# Patient Record
Sex: Male | Born: 1940 | Race: White | Hispanic: Yes | Marital: Married | Smoking: Former smoker
Health system: Southern US, Community
[De-identification: ages and names within clinical notes are randomized; demographics above are authoritative.]

## PROBLEM LIST (undated history)

## (undated) DIAGNOSIS — I420 Dilated cardiomyopathy: Secondary | ICD-10-CM

## (undated) DIAGNOSIS — I454 Nonspecific intraventricular block: Secondary | ICD-10-CM

## (undated) DIAGNOSIS — E119 Type 2 diabetes mellitus without complications: Secondary | ICD-10-CM

## (undated) DIAGNOSIS — N179 Acute kidney failure, unspecified: Secondary | ICD-10-CM

## (undated) DIAGNOSIS — N189 Chronic kidney disease, unspecified: Secondary | ICD-10-CM

## (undated) DIAGNOSIS — I509 Heart failure, unspecified: Secondary | ICD-10-CM

## (undated) DIAGNOSIS — I10 Essential (primary) hypertension: Secondary | ICD-10-CM

## (undated) DIAGNOSIS — I480 Paroxysmal atrial fibrillation: Secondary | ICD-10-CM

## (undated) HISTORY — PX: KIDNEY STONE SURGERY: SHX686

---

## 2014-04-20 ENCOUNTER — Emergency Department (HOSPITAL_COMMUNITY): Payer: Medicare Other

## 2014-04-20 ENCOUNTER — Encounter (HOSPITAL_COMMUNITY): Payer: Self-pay | Admitting: Emergency Medicine

## 2014-04-20 ENCOUNTER — Inpatient Hospital Stay (HOSPITAL_COMMUNITY)
Admission: EM | Admit: 2014-04-20 | Discharge: 2014-04-24 | DRG: 291 | Disposition: A | Discharge status: Home / Self Care | Payer: Medicare Other | Attending: Internal Medicine | Admitting: Internal Medicine

## 2014-04-20 DIAGNOSIS — I5043 Acute on chronic combined systolic (congestive) and diastolic (congestive) heart failure: Secondary | ICD-10-CM

## 2014-04-20 DIAGNOSIS — Z6829 Body mass index (BMI) 29.0-29.9, adult: Secondary | ICD-10-CM

## 2014-04-20 DIAGNOSIS — Z87891 Personal history of nicotine dependence: Secondary | ICD-10-CM

## 2014-04-20 DIAGNOSIS — I5031 Acute diastolic (congestive) heart failure: Secondary | ICD-10-CM

## 2014-04-20 DIAGNOSIS — E1149 Type 2 diabetes mellitus with other diabetic neurological complication: Secondary | ICD-10-CM | POA: Diagnosis present

## 2014-04-20 DIAGNOSIS — N182 Chronic kidney disease, stage 2 (mild): Secondary | ICD-10-CM | POA: Diagnosis present

## 2014-04-20 DIAGNOSIS — Z833 Family history of diabetes mellitus: Secondary | ICD-10-CM

## 2014-04-20 DIAGNOSIS — I447 Left bundle-branch block, unspecified: Secondary | ICD-10-CM | POA: Diagnosis present

## 2014-04-20 DIAGNOSIS — I5023 Acute on chronic systolic (congestive) heart failure: Secondary | ICD-10-CM

## 2014-04-20 DIAGNOSIS — E119 Type 2 diabetes mellitus without complications: Secondary | ICD-10-CM

## 2014-04-20 DIAGNOSIS — Z7902 Long term (current) use of antithrombotics/antiplatelets: Secondary | ICD-10-CM

## 2014-04-20 DIAGNOSIS — I10 Essential (primary) hypertension: Secondary | ICD-10-CM | POA: Diagnosis present

## 2014-04-20 DIAGNOSIS — E663 Overweight: Secondary | ICD-10-CM | POA: Diagnosis present

## 2014-04-20 DIAGNOSIS — I498 Other specified cardiac arrhythmias: Secondary | ICD-10-CM | POA: Diagnosis present

## 2014-04-20 DIAGNOSIS — R0602 Shortness of breath: Secondary | ICD-10-CM | POA: Diagnosis not present

## 2014-04-20 DIAGNOSIS — J9600 Acute respiratory failure, unspecified whether with hypoxia or hypercapnia: Secondary | ICD-10-CM | POA: Diagnosis present

## 2014-04-20 DIAGNOSIS — I420 Dilated cardiomyopathy: Secondary | ICD-10-CM | POA: Diagnosis present

## 2014-04-20 DIAGNOSIS — E1142 Type 2 diabetes mellitus with diabetic polyneuropathy: Secondary | ICD-10-CM | POA: Diagnosis present

## 2014-04-20 DIAGNOSIS — Z789 Other specified health status: Secondary | ICD-10-CM | POA: Diagnosis present

## 2014-04-20 DIAGNOSIS — IMO0001 Reserved for inherently not codable concepts without codable children: Secondary | ICD-10-CM

## 2014-04-20 DIAGNOSIS — I509 Heart failure, unspecified: Secondary | ICD-10-CM | POA: Diagnosis present

## 2014-04-20 DIAGNOSIS — I5021 Acute systolic (congestive) heart failure: Secondary | ICD-10-CM

## 2014-04-20 DIAGNOSIS — I5042 Chronic combined systolic (congestive) and diastolic (congestive) heart failure: Secondary | ICD-10-CM

## 2014-04-20 DIAGNOSIS — Z794 Long term (current) use of insulin: Secondary | ICD-10-CM

## 2014-04-20 DIAGNOSIS — I129 Hypertensive chronic kidney disease with stage 1 through stage 4 chronic kidney disease, or unspecified chronic kidney disease: Secondary | ICD-10-CM | POA: Diagnosis present

## 2014-04-20 HISTORY — DX: Essential (primary) hypertension: I10

## 2014-04-20 HISTORY — DX: Heart failure, unspecified: I50.9

## 2014-04-20 HISTORY — DX: Type 2 diabetes mellitus without complications: E11.9

## 2014-04-20 NOTE — ED Notes (Signed)
Sob; EMS states, "abd. Distention; blowing into balloon helps; does have a weak heart [no cardiologists.]" liver distention and dependent edema. RA sao2 88%; 4l . > 93 %

## 2014-04-20 NOTE — ED Provider Notes (Signed)
CSN: [id number]     Arrival date & time 04/20/14  2313 History   First MD Initiated Contact with Patient 04/20/14 2320     Chief Complaint  Patient presents with  . Shortness of Breath     (Consider location/radiation/quality/duration/timing/severity/associated sxs/prior Treatment) Patient is a 73 y.o. male presenting with shortness of breath. The history is limited by the condition of the patient and a language barrier.  Shortness of Breath Severity:  Severe Onset quality:  Gradual Duration:  1 week Timing:  Constant Progression:  Worsening Chronicity:  New Context: not weather changes   Relieved by:  Nothing Worsened by:  Nothing tried Ineffective treatments:  None tried Associated symptoms: no chest pain, no fever and no wheezing   Risk factors: no hx of PE/DVT     History reviewed. No pertinent past medical history. History reviewed. No pertinent past surgical history. History reviewed. No pertinent family history. History  Substance Use Topics  . Smoking status: Not on file  . Smokeless tobacco: Not on file  . Alcohol Use: Not on file    Review of Systems  Constitutional: Negative for fever.  Respiratory: Positive for shortness of breath. Negative for wheezing.   Cardiovascular: Negative for chest pain.  All other systems reviewed and are negative.     Allergies  Review of patient's allergies indicates no known allergies.  Home Medications   Prior to Admission medications   Not on File   There were no vitals taken for this visit. Physical Exam  Constitutional: He is oriented to person, place, and time. He appears well-developed and well-nourished.  HENT:  Head: Normocephalic.  Mouth/Throat: Oropharynx is clear and moist. No oropharyngeal exudate.  Eyes: Conjunctivae are normal. Pupils are equal, round, and reactive to light.  Neck: Normal range of motion. Neck supple.  Cardiovascular: Regular rhythm.  Tachycardia present.   Pulmonary/Chest: No  stridor. No respiratory distress. He has no wheezes. He has rales.  Abdominal: Soft. Bowel sounds are normal. There is no tenderness. There is no rebound and no guarding.  Musculoskeletal: Normal range of motion. He exhibits edema.  Neurological: He is alert and oriented to person, place, and time.  Skin: Skin is warm and dry. He is not diaphoretic.  Psychiatric: He has a normal mood and affect.    ED Course  Procedures (including critical care time) Labs Review Labs Reviewed  CBC  BASIC METABOLIC PANEL  PRO B NATRIURETIC PEPTIDE  HEPATIC FUNCTION PANEL  LIPASE, BLOOD  I-STAT TROPOININ, ED    Imaging Review No results found.   EKG Interpretation None      MDM   Final diagnoses:  None     Date: 04/20/2014  Rate: 105  Rhythm: sinus tachycardia  QRS Axis: left  Intervals: QT prolonged  ST/T Wave abnormalities: normal  Conduction Disutrbances:left bundle branch block  Narrative Interpretation:   Old EKG Reviewed: none available    Anasarca with SOB for CHF needs admission  Nicol Herbig K Zoelle Markus-Rasch, MD 04/21/14 [phone]

## 2014-04-21 ENCOUNTER — Encounter (HOSPITAL_COMMUNITY): Payer: Self-pay | Admitting: Emergency Medicine

## 2014-04-21 DIAGNOSIS — I5021 Acute systolic (congestive) heart failure: Secondary | ICD-10-CM

## 2014-04-21 DIAGNOSIS — I509 Heart failure, unspecified: Secondary | ICD-10-CM | POA: Diagnosis present

## 2014-04-21 DIAGNOSIS — E1149 Type 2 diabetes mellitus with other diabetic neurological complication: Secondary | ICD-10-CM

## 2014-04-21 DIAGNOSIS — I5043 Acute on chronic combined systolic (congestive) and diastolic (congestive) heart failure: Secondary | ICD-10-CM | POA: Diagnosis present

## 2014-04-21 DIAGNOSIS — Z833 Family history of diabetes mellitus: Secondary | ICD-10-CM | POA: Diagnosis not present

## 2014-04-21 DIAGNOSIS — Z794 Long term (current) use of insulin: Secondary | ICD-10-CM

## 2014-04-21 DIAGNOSIS — Z87891 Personal history of nicotine dependence: Secondary | ICD-10-CM | POA: Diagnosis not present

## 2014-04-21 DIAGNOSIS — E119 Type 2 diabetes mellitus without complications: Secondary | ICD-10-CM

## 2014-04-21 DIAGNOSIS — I5020 Unspecified systolic (congestive) heart failure: Secondary | ICD-10-CM

## 2014-04-21 DIAGNOSIS — I447 Left bundle-branch block, unspecified: Secondary | ICD-10-CM | POA: Diagnosis present

## 2014-04-21 DIAGNOSIS — I5023 Acute on chronic systolic (congestive) heart failure: Secondary | ICD-10-CM | POA: Insufficient documentation

## 2014-04-21 DIAGNOSIS — E1142 Type 2 diabetes mellitus with diabetic polyneuropathy: Secondary | ICD-10-CM | POA: Diagnosis present

## 2014-04-21 DIAGNOSIS — Z7902 Long term (current) use of antithrombotics/antiplatelets: Secondary | ICD-10-CM | POA: Diagnosis not present

## 2014-04-21 DIAGNOSIS — N182 Chronic kidney disease, stage 2 (mild): Secondary | ICD-10-CM | POA: Diagnosis present

## 2014-04-21 DIAGNOSIS — J9600 Acute respiratory failure, unspecified whether with hypoxia or hypercapnia: Secondary | ICD-10-CM | POA: Diagnosis present

## 2014-04-21 DIAGNOSIS — IMO0001 Reserved for inherently not codable concepts without codable children: Secondary | ICD-10-CM

## 2014-04-21 DIAGNOSIS — I129 Hypertensive chronic kidney disease with stage 1 through stage 4 chronic kidney disease, or unspecified chronic kidney disease: Secondary | ICD-10-CM | POA: Diagnosis present

## 2014-04-21 DIAGNOSIS — I5042 Chronic combined systolic (congestive) and diastolic (congestive) heart failure: Secondary | ICD-10-CM | POA: Insufficient documentation

## 2014-04-21 DIAGNOSIS — R0602 Shortness of breath: Secondary | ICD-10-CM | POA: Diagnosis present

## 2014-04-21 DIAGNOSIS — Z789 Other specified health status: Secondary | ICD-10-CM | POA: Diagnosis present

## 2014-04-21 DIAGNOSIS — Z6829 Body mass index (BMI) 29.0-29.9, adult: Secondary | ICD-10-CM | POA: Diagnosis not present

## 2014-04-21 DIAGNOSIS — E663 Overweight: Secondary | ICD-10-CM | POA: Diagnosis present

## 2014-04-21 DIAGNOSIS — I059 Rheumatic mitral valve disease, unspecified: Secondary | ICD-10-CM

## 2014-04-21 DIAGNOSIS — I498 Other specified cardiac arrhythmias: Secondary | ICD-10-CM | POA: Diagnosis present

## 2014-04-21 DIAGNOSIS — I5031 Acute diastolic (congestive) heart failure: Secondary | ICD-10-CM

## 2014-04-21 DIAGNOSIS — I10 Essential (primary) hypertension: Secondary | ICD-10-CM

## 2014-04-21 LAB — BASIC METABOLIC PANEL
ANION GAP: 12 (ref 5–15)
BUN: 15 mg/dL (ref 6–23)
CO2: 28 mEq/L (ref 19–32)
CREATININE: 1.2 mg/dL (ref 0.50–1.35)
Calcium: 8.8 mg/dL (ref 8.4–10.5)
Chloride: 102 mEq/L (ref 96–112)
GFR calc non Af Amer: 58 mL/min — ABNORMAL LOW (ref >90)
GFR, EST AFRICAN AMERICAN: 67 mL/min — AB (ref >90)
Glucose, Bld: 225 mg/dL — ABNORMAL HIGH (ref 70–99)
POTASSIUM: 4.8 meq/L (ref 3.7–5.3)
Sodium: 142 mEq/L (ref 137–147)

## 2014-04-21 LAB — CBC
HEMATOCRIT: 39.4 % (ref 39.0–52.0)
Hemoglobin: 13.4 g/dL (ref 13.0–17.0)
MCH: 31.2 pg (ref 26.0–34.0)
MCHC: 34 g/dL (ref 30.0–36.0)
MCV: 91.6 fL (ref 78.0–100.0)
PLATELETS: 155 10*3/uL (ref 150–400)
RBC: 4.3 MIL/uL (ref 4.22–5.81)
RDW: 12.6 % (ref 11.5–15.5)
WBC: 6.3 10*3/uL (ref 4.0–10.5)

## 2014-04-21 LAB — HEPATIC FUNCTION PANEL
ALT: 34 U/L (ref 0–53)
AST: 37 U/L (ref 0–37)
Albumin: 3.8 g/dL (ref 3.5–5.2)
Alkaline Phosphatase: 67 U/L (ref 39–117)
BILIRUBIN TOTAL: 0.6 mg/dL (ref 0.3–1.2)
Total Protein: 7 g/dL (ref 6.0–8.3)

## 2014-04-21 LAB — I-STAT TROPONIN, ED: Troponin i, poc: 0.01 ng/mL (ref 0.00–0.08)

## 2014-04-21 LAB — GLUCOSE, CAPILLARY
GLUCOSE-CAPILLARY: 202 mg/dL — AB (ref 70–99)
Glucose-Capillary: 196 mg/dL — ABNORMAL HIGH (ref 70–99)
Glucose-Capillary: 90 mg/dL (ref 70–99)
Glucose-Capillary: 177 mg/dL — ABNORMAL HIGH (ref 70–99)

## 2014-04-21 LAB — LIPASE, BLOOD: Lipase: 29 U/L (ref 11–59)

## 2014-04-21 LAB — PRO B NATRIURETIC PEPTIDE: Pro B Natriuretic peptide (BNP): 1756 pg/mL — ABNORMAL HIGH (ref 0–125)

## 2014-04-21 LAB — HEMOGLOBIN A1C
HEMOGLOBIN A1C: 7.1 % — AB (ref <5.7)
MEAN PLASMA GLUCOSE: 157 mg/dL — AB (ref <117)

## 2014-04-21 MED ORDER — ONDANSETRON HCL 4 MG/2ML IJ SOLN: 4.0000 mg | Freq: Four times a day (QID) | INTRAMUSCULAR | Status: DC | PRN

## 2014-04-21 MED ORDER — DIGOXIN 125 MCG PO TABS
0.1250 mg | ORAL_TABLET | Freq: Every day | ORAL | Status: DC
  Administered 2014-04-21 – 2014-04-24 (×4): 0.125 mg via ORAL
  Filled 2014-04-21 (×4): qty 1

## 2014-04-21 MED ORDER — INSULIN ASPART 100 UNIT/ML ~~LOC~~ SOLN
28.0000 [IU] | Freq: Every day | SUBCUTANEOUS | Status: DC
  Administered 2014-04-21: 28 [IU] via SUBCUTANEOUS

## 2014-04-21 MED ORDER — INSULIN GLARGINE 100 UNIT/ML ~~LOC~~ SOLN
80.0000 [IU] | Freq: Every day | SUBCUTANEOUS | Status: DC
  Administered 2014-04-21: 80 [IU] via SUBCUTANEOUS
  Filled 2014-04-21: qty 0.8

## 2014-04-21 MED ORDER — MORPHINE SULFATE 2 MG/ML IJ SOLN: 1.0000 mg | INTRAMUSCULAR | Status: DC | PRN

## 2014-04-21 MED ORDER — INSULIN ASPART 100 UNIT/ML ~~LOC~~ SOLN
20.0000 [IU] | Freq: Every day | SUBCUTANEOUS | Status: DC
  Administered 2014-04-21: 20 [IU] via SUBCUTANEOUS

## 2014-04-21 MED ORDER — SODIUM CHLORIDE 0.9 % IJ SOLN: 3.0000 mL | INTRAMUSCULAR | Status: DC | PRN

## 2014-04-21 MED ORDER — ACETAMINOPHEN 325 MG PO TABS: 650.0000 mg | ORAL_TABLET | ORAL | Status: DC | PRN

## 2014-04-21 MED ORDER — POTASSIUM CHLORIDE CRYS ER 10 MEQ PO TBCR
10.0000 meq | EXTENDED_RELEASE_TABLET | Freq: Once | ORAL | Status: AC
  Administered 2014-04-21: 10 meq via ORAL
  Filled 2014-04-21: qty 1

## 2014-04-21 MED ORDER — ATORVASTATIN CALCIUM 40 MG PO TABS
40.0000 mg | ORAL_TABLET | Freq: Every day | ORAL | Status: DC
  Administered 2014-04-21 – 2014-04-24 (×4): 40 mg via ORAL
  Filled 2014-04-21 (×4): qty 1

## 2014-04-21 MED ORDER — SODIUM CHLORIDE 0.9 % IJ SOLN
3.0000 mL | Freq: Two times a day (BID) | INTRAMUSCULAR | Status: DC
  Administered 2014-04-21 (×2): 3 mL via INTRAVENOUS
  Administered 2014-04-21: 11:00:00 via INTRAVENOUS
  Administered 2014-04-22 – 2014-04-24 (×5): 3 mL via INTRAVENOUS

## 2014-04-21 MED ORDER — INSULIN GLARGINE 100 UNIT/ML ~~LOC~~ SOLN
65.0000 [IU] | Freq: Every day | SUBCUTANEOUS | Status: DC
  Administered 2014-04-22: 65 [IU] via SUBCUTANEOUS
  Filled 2014-04-21: qty 0.65

## 2014-04-21 MED ORDER — INSULIN ASPART 100 UNIT/ML ~~LOC~~ SOLN
0.0000 [IU] | Freq: Every day | SUBCUTANEOUS | Status: DC
  Administered 2014-04-23: 2 [IU] via SUBCUTANEOUS

## 2014-04-21 MED ORDER — GABAPENTIN 300 MG PO CAPS
300.0000 mg | ORAL_CAPSULE | Freq: Three times a day (TID) | ORAL | Status: DC
  Administered 2014-04-21 – 2014-04-24 (×11): 300 mg via ORAL
  Filled 2014-04-21 (×12): qty 1

## 2014-04-21 MED ORDER — FUROSEMIDE 10 MG/ML IJ SOLN
100.0000 mg | Freq: Two times a day (BID) | INTRAVENOUS | Status: AC
  Administered 2014-04-21 – 2014-04-22 (×4): 100 mg via INTRAVENOUS
  Filled 2014-04-21 (×4): qty 10

## 2014-04-21 MED ORDER — ZOLPIDEM TARTRATE 5 MG PO TABS: 5.0000 mg | ORAL_TABLET | Freq: Every evening | ORAL | Status: DC | PRN

## 2014-04-21 MED ORDER — INSULIN ASPART 100 UNIT/ML ~~LOC~~ SOLN
0.0000 [IU] | SUBCUTANEOUS | Status: DC
  Administered 2014-04-21: 2 [IU] via SUBCUTANEOUS
  Administered 2014-04-21: 3 [IU] via SUBCUTANEOUS

## 2014-04-21 MED ORDER — ENALAPRIL MALEATE 2.5 MG PO TABS
2.5000 mg | ORAL_TABLET | Freq: Every day | ORAL | Status: DC
  Administered 2014-04-21 – 2014-04-24 (×4): 2.5 mg via ORAL
  Filled 2014-04-21 (×4): qty 1

## 2014-04-21 MED ORDER — ENOXAPARIN SODIUM 40 MG/0.4ML ~~LOC~~ SOLN
40.0000 mg | SUBCUTANEOUS | Status: DC
Start: 2014-04-21 — End: 2014-04-24
  Administered 2014-04-21 – 2014-04-24 (×4): 40 mg via SUBCUTANEOUS
  Filled 2014-04-21 (×4): qty 0.4

## 2014-04-21 MED ORDER — INSULIN ASPART 100 UNIT/ML ~~LOC~~ SOLN: 32.0000 [IU] | Freq: Every day | SUBCUTANEOUS | Status: DC

## 2014-04-21 MED ORDER — CARVEDILOL 3.125 MG PO TABS
3.1250 mg | ORAL_TABLET | Freq: Two times a day (BID) | ORAL | Status: DC
  Administered 2014-04-21 – 2014-04-24 (×8): 3.125 mg via ORAL
  Filled 2014-04-21 (×10): qty 1

## 2014-04-21 MED ORDER — INSULIN ASPART 100 UNIT/ML ~~LOC~~ SOLN
10.0000 [IU] | Freq: Three times a day (TID) | SUBCUTANEOUS | Status: DC
  Administered 2014-04-21 – 2014-04-24 (×8): 10 [IU] via SUBCUTANEOUS

## 2014-04-21 MED ORDER — SODIUM CHLORIDE 0.9 % IV SOLN: 250.0000 mL | INTRAVENOUS | Status: DC | PRN

## 2014-04-21 MED ORDER — CLOPIDOGREL BISULFATE 75 MG PO TABS
75.0000 mg | ORAL_TABLET | Freq: Every day | ORAL | Status: DC
  Administered 2014-04-21 – 2014-04-24 (×4): 75 mg via ORAL
  Filled 2014-04-21 (×4): qty 1

## 2014-04-21 MED ORDER — FUROSEMIDE 10 MG/ML IJ SOLN
40.0000 mg | Freq: Once | INTRAMUSCULAR | Status: AC
  Administered 2014-04-21: 40 mg via INTRAVENOUS
  Filled 2014-04-21: qty 4

## 2014-04-21 MED ORDER — INSULIN ASPART 100 UNIT/ML ~~LOC~~ SOLN: 20.0000 [IU] | Freq: Three times a day (TID) | SUBCUTANEOUS | Status: DC

## 2014-04-21 MED ORDER — INSULIN ASPART 100 UNIT/ML ~~LOC~~ SOLN
0.0000 [IU] | Freq: Three times a day (TID) | SUBCUTANEOUS | Status: DC
  Administered 2014-04-22: 3 [IU] via SUBCUTANEOUS
  Administered 2014-04-22: 2 [IU] via SUBCUTANEOUS
  Administered 2014-04-23 – 2014-04-24 (×3): 1 [IU] via SUBCUTANEOUS

## 2014-04-21 MED ORDER — POLYETHYLENE GLYCOL 3350 17 G PO PACK
17.0000 g | PACK | Freq: Two times a day (BID) | ORAL | Status: DC
  Administered 2014-04-21 – 2014-04-24 (×7): 17 g via ORAL
  Filled 2014-04-21 (×8): qty 1

## 2014-04-21 NOTE — H&P (Signed)
[facility] History and Physical  Stanley Farmer HRC:[id number] DOB: 1941-04-16 DOA: 04/20/2014  Referring physician:  EDP PCP:  Dr. Modesto Charon in [facility]:   Chief Complaint:  SOB  HPI: Stanley Farmer is a 73 y.o. male with a history of CHF, HTN, and IDDM who presents to the ED with complaints of worsening SOB and swelling of his lower legs x 1 week.   He denies having any Chest pain.  When he arrived to the ED he was found to be hypoxic, and was placed on 4 liters NCO2, his evaluation revealed a Pro BNP = 1756.0 and a Chest X-Ray with Cardiomegaly and Early Interstitial Edema.   He was administered IV Lasix and referred for medical admission.   His Son is at the bedside and assisted in translation of his medical history.      Review of Systems:  Constitutional: No Weight Loss, No Weight Gain, Night Sweats, Fevers, Chills, Fatigue, or Generalized Weakness HEENT: No Headaches, Difficulty Swallowing,Tooth/Dental Problems,Sore Throat,  No Sneezing, Rhinitis, Ear Ache, Nasal Congestion, or Post Nasal Drip,  Cardio-vascular:  No Chest pain, +Orthopnea, PND, +Edema in lower extremities, +Anasarca, Dizziness, Palpitations  Resp:  +Dyspnea,  +DOE, No Cough, No Hemoptysis, No Wheezing.    GI: No Heartburn, Indigestion, Abdominal Pain, Nausea, Vomiting, Diarrhea, Change in Bowel Habits,  Loss of Appetite  GU: No Dysuria, Change in Color of Urine, No Urgency or Frequency.  No flank pain.  Musculoskeletal: No Joint Pain or Swelling.  No Decreased Range of Motion. No Back Pain.  Neurologic: No Syncope, No Seizures, Muscle Weakness, Paresthesia, Vision Disturbance or Loss, No Diplopia, No Vertigo, No Difficulty Walking,  Skin: No Rash or Lesions. Psych: No Change in Mood or Affect. No Depression or Anxiety. No Memory loss. No Confusion or Hallucinations   Past Medical History  Diagnosis Date  . Hypertension   . Diabetes mellitus without complication   . CHF (congestive heart  failure)       Past Surgical History  Procedure Laterality Date  . Kidney stone surgery         Prior to Admission medications   Medication Sig Start Date End Date Taking? Authorizing Provider  atorvastatin (LIPITOR) 40 MG tablet Take 40 mg by mouth daily.   Yes Historical Provider, MD  carvedilol (COREG) 3.125 MG tablet Take 3.125 mg by mouth 2 (two) times daily with a meal.   Yes Historical Provider, MD  clopidogrel (PLAVIX) 75 MG tablet Take 75 mg by mouth daily.   Yes Historical Provider, MD  digoxin (LANOXIN) 0.125 MG tablet Take 0.125 mg by mouth daily.   Yes Historical Provider, MD  enalapril (VASOTEC) 2.5 MG tablet Take 2.5 mg by mouth daily.   Yes Historical Provider, MD  furosemide (LASIX) 40 MG tablet Take 40-80 mg by mouth 2 (two) times daily. Take 2 tablets every morning and 1 tablet at lunch   Yes Historical Provider, MD  gabapentin (NEURONTIN) 300 MG capsule Take 300 mg by mouth 3 (three) times daily.   Yes Historical Provider, MD  insulin aspart (NOVOLOG) 100 UNIT/ML injection Inject 20-32 Units into the skin 3 (three) times daily with meals. Use 20 units every morning, use 28 units at lunch and 32 units every evening   Yes Historical Provider, MD  insulin glargine (LANTUS) 100 UNIT/ML injection Inject 80 Units into the skin daily.   Yes Historical Provider, MD  polyethylene glycol (MIRALAX / GLYCOLAX) packet Take 17 g by mouth 2 (two)  times daily.   Yes Historical Provider, MD      No Known Allergies   Social History:  reports that he has quit smoking. He does not have any smokeless tobacco history on file. He reports that he does not drink alcohol or use illicit drugs.     Family History  Problem Relation Age of Onset  . Diabetes Mother        Physical Exam:  GEN:  Pleasant Obese 73 y.o. Hispanic male examined and in no acute distress; cooperative with exam Filed Vitals:   04/21/14 0130 04/21/14 0145 04/21/14 0200 04/21/14 0222  BP: 130/72 154/61 146/71  131/69  Pulse: 94 110 97 91  Temp:      TempSrc:      Resp: 22 26 18    Height:      Weight:      SpO2: 97% 98% 96% 98%   Blood pressure 131/69, pulse 91, temperature 98.4 F (36.9 C), temperature source Oral, resp. rate 18, height 5\' 6"  (1.676 m), weight 84.369 kg (186 lb), SpO2 98.00%. PSYCH: He is alert and oriented x4; does not appear anxious does not appear depressed; affect is normal HEENT: Normocephalic and Atraumatic, Mucous membranes pink; PERRLA; EOM intact; Fundi:  Benign;  No scleral icterus, Nares: Patent, Oropharynx: Clear, Fair Dentition, Neck:  FROM, no cervical lymphadenopathy nor thyromegaly or carotid bruit; no JVD; Breasts:: Not examined CHEST WALL: No tenderness CHEST: Unlabored Breathing but  Decreased Breath Sounds, +Bibasilar Rales , No Rhonchi, No wheezes.   HEART: Regular rate and rhythm; no murmurs rubs or gallops BACK: No kyphosis or scoliosis; no CVA tenderness ABDOMEN: Positive Bowel Sounds, Obese, soft non-tender; no masses, no organomegaly. Rectal Exam: Not done EXTREMITIES: No cyanosis, clubbing,  +EDEMA 2+ to BLEs;  no ulcerations. Genitalia: not examined PULSES: 2+ and symmetric SKIN: Normal hydration no rash or ulceration CNS: Alert and Oriented x 4, No Focal Deficits.    Vascular: pulses palpable throughout    Labs on Admission:  Basic Metabolic Panel:  Recent Labs Lab 04/20/14 2350  NA 142  K 4.8  CL 102  CO2 28  GLUCOSE 225*  BUN 15  CREATININE 1.20  CALCIUM 8.8   Liver Function Tests:  Recent Labs Lab 04/20/14 2350  AST 37  ALT 34  ALKPHOS 67  BILITOT 0.6  PROT 7.0  ALBUMIN 3.8    Recent Labs Lab 04/20/14 2350  LIPASE 29   No results found for this basename: AMMONIA,  in the last 168 hours CBC:  Recent Labs Lab 04/20/14 2350  WBC 6.3  HGB 13.4  HCT 39.4  MCV 91.6  PLT 155   Cardiac Enzymes: No results found for this basename: CKTOTAL, CKMB, CKMBINDEX, TROPONINI,  in the last 168 hours  BNP (last 3  results)  Recent Labs  04/20/14 2350  PROBNP 1756.0*   CBG: No results found for this basename: GLUCAP,  in the last 168 hours  Radiological Exams on Admission: Dg Chest Port 1 View  04/21/2014   CLINICAL DATA:  Shortness of breath, chest pain.  EXAM: PORTABLE CHEST - 1 VIEW  COMPARISON:  None.  FINDINGS: Cardiomegaly. No confluent airspace opacities or effusions. Mild interstitial prominence and Kerley B-lines in the lung bases concerning for early interstitial edema. No acute bony abnormality.  IMPRESSION: Cardiomegaly, suspect early interstitial edema.   Electronically Signed   By: Charlett NoseKevin  Dover M.D.   On: 04/21/2014 00:09      EKG: Independently reviewed. Sinus Tachycardia at 105, +  LBBB    Assessment/Plan:   73 y.o. male with  Principal Problem:   Acute diastolic CHF (congestive heart failure) Active Problems:   CHF (congestive heart failure)   Acute respiratory failure   Essential hypertension   IDDM (insulin dependent diabetes mellitus)     1.   Acute Diastolic CHF-  Placed on the CHF Protocol to diurese with IV Lasix 100 mg every 12 hours.   The initial K+ = 4.8,  Monitor K+ levels.  2 D ECHO in AM.    3.   Acute Respiratory Failure-  Due to #1,  [place] O2 PRN.     3.  HTN-  Continue Lasix, Coreg, and Vasotec RX. Monitor BPs.     4.  IDDM-  Continue Lantus and meal TID Novolog, SSI coverage PRN with Sensitive.  Check Glucose every 4 hours due to his home Regimen   5.  DVT prophylaxis with Lovenox.          Code Status:   FULL CODE  Family Communication:    Family at Bedside Disposition Plan:   Inpatient Telemetry Bed    Time spent:   60 Minutes  Ron Parker [facility] Pager [phone]  If 7PM-7AM, please contact night-coverage [email or web address] Password [facility] 04/21/2014, 2:37 AM

## 2014-04-21 NOTE — Progress Notes (Signed)
Echocardiogram 2D Echocardiogram has been performed.  Stanley Farmer 04/21/2014, 11:11 AM

## 2014-04-21 NOTE — ED Notes (Signed)
Transporting patient to new room assignment. 

## 2014-04-21 NOTE — Progress Notes (Signed)
[facility] PROGRESS NOTE  Stanley Farmer [id number]:[id number] DOB: 1941-02-07 DOA: 04/20/2014 PCP: No primary provider on file.  Assessment/Plan: Principal Problem:   Acute on chronic combined systolic and diastolic CHF (congestive heart failure): Diuresed 1.4 L already. Echocardiogram notes markedly depressed ejection fraction plus diastolic dysfunction. Patient used to see a cardiologist in [place], but has not seen him in a very long time. Based off of his medications including beta blocker, ACE inhibitor, digoxin and diuretic, this is clearly not a new diagnosis. Have involved [facility] cardiology for help in management. Likely he will need strong and close follow up as outpatient. Tried to convey to family the seriousness of his heart condition Active Problems:   Acute respiratory failure: Resolved. After one dose of Lasix, diuresed 1.5 L and is already breathing much more comfortably.   Essential hypertension: Continue his home medications.    Non-English speaking patient: Medical interpreter used    DM type 2 with diabetic peripheral neuropathy: Despite aggressively high insulin coverage with Lantus and 3 times a day NovoLog, patient's A1c actually shows good control. Continue Neurontin. I have scaled down his NovoLog and his Lantus as I suspect his insulin needs may go down as he is diuresed.    CKD (chronic kidney disease), stage II: Looks to be stable, with creatinine around 1.2.  Overweight: Patient needs criteria with a BMI 29   Code Status: Full code Family Communication: Multiple family members at bedside.  Disposition Plan: Despite overall poor function of heart, only mildly volume overloaded.  Disposition will be determined on the extent of cardiac workup.   Consultants:  Cardiology  Procedures:  Echocardiogram done 7/5: Grade 2 diastolic dysfunction with a significantly decreased ejection fraction of 15%  Antibiotics:  None  HPI/Subjective: Patient states he's  feeling better. She like his breathing more comfortable. Denies any chest pain or other symptoms.  Objective: Filed Vitals:   04/21/14 0930  BP: 136/72  Pulse: 80  Temp: 98.7 F (37.1 C)  Resp: 20    Intake/Output Summary (Last 24 hours) at 04/21/14 1436 Last data filed at 04/21/14 0900  Gross per 24 hour  Intake    240 ml  Output   1700 ml  Net  -1460 ml   Filed Weights   04/20/14 2328 04/21/14 0259  Weight: 84.369 kg (186 lb) 83.825 kg (184 lb 12.8 oz)    Exam:   General:  Alert and oriented x3, currently no acute distress  Cardiovascular: Soft, regular rate and rhythm, S1-S2, soft 2/6 systolic ejection murmur  Respiratory: Moderate inspiratory effort, decreased breath sounds at the bases, otherwise clear  Abdomen: Soft, obese, nontender, positive bowel sounds  Musculoskeletal: No clubbing or cyanosis, 1+ pitting edema from the knees down   Data Reviewed: Basic Metabolic Panel:  Recent Labs Lab 04/20/14 2350  NA 142  K 4.8  CL 102  CO2 28  GLUCOSE 225*  BUN 15  CREATININE 1.20  CALCIUM 8.8   Liver Function Tests:  Recent Labs Lab 04/20/14 2350  AST 37  ALT 34  ALKPHOS 67  BILITOT 0.6  PROT 7.0  ALBUMIN 3.8    Recent Labs Lab 04/20/14 2350  LIPASE 29   No results found for this basename: AMMONIA,  in the last 168 hours CBC:  Recent Labs Lab 04/20/14 2350  WBC 6.3  HGB 13.4  HCT 39.4  MCV 91.6  PLT 155   Cardiac Enzymes: No results found for this basename: CKTOTAL, CKMB, CKMBINDEX, TROPONINI,  in the  last 168 hours BNP (last 3 results)  Recent Labs  04/20/14 2350  PROBNP 1756.0*   CBG:  Recent Labs Lab 04/21/14 0238 04/21/14 1202  GLUCAP 196* 202*    No results found for this or any previous visit (from the past 240 hour(s)).   Studies: Dg Chest Port 1 View  04/21/2014   CLINICAL DATA:  Shortness of breath, chest pain.  EXAM: PORTABLE CHEST - 1 VIEW  COMPARISON:  None.  FINDINGS: Cardiomegaly. No confluent airspace  opacities or effusions. Mild interstitial prominence and Kerley B-lines in the lung bases concerning for early interstitial edema. No acute bony abnormality.  IMPRESSION: Cardiomegaly, suspect early interstitial edema.   Electronically Signed   By: Charlett NoseKevin  Farmer M.D.   On: 04/21/2014 00:09    Scheduled Meds: . atorvastatin  40 mg Oral Daily  . carvedilol  3.125 mg Oral BID WC  . clopidogrel  75 mg Oral Daily  . digoxin  0.125 mg Oral Daily  . enalapril  2.5 mg Oral Daily  . enoxaparin (LOVENOX) injection  40 mg Subcutaneous Q24H  . furosemide  100 mg Intravenous Q12H  . gabapentin  300 mg Oral TID  . insulin aspart  0-5 Units Subcutaneous QHS  . insulin aspart  0-9 Units Subcutaneous TID WC  . insulin aspart  10 Units Subcutaneous TID WC  . [START ON 04/22/2014] insulin glargine  65 Units Subcutaneous Daily  . polyethylene glycol  17 g Oral BID  . sodium chloride  3 mL Intravenous Q12H   Continuous Infusions:   Principal Problem:   Acute on chronic combined systolic and diastolic CHF (congestive heart failure) Active Problems:   Acute respiratory failure   Essential hypertension   IDDM (insulin dependent diabetes mellitus)   Non-English speaking patient   DM type 2 with diabetic peripheral neuropathy   CKD (chronic kidney disease), stage II    Time spent: 35 minutes    Hollice EspyKRISHNAN,Stanley Sara K  [facility] Pager [phone]. If 7PM-7AM, please contact night-coverage at [email or web address], password [email or web address] 04/21/2014, 2:36 PM  LOS: 1 day

## 2014-04-21 NOTE — Consult Note (Signed)
Consulting cardiologist: Dina RichJonathan Signe Tackitt MD   The patient is spanish speaking, history collected with assistance of interpretor.  Clinical Summary Stanley Farmer is a 73 y.o.male with a reported but unclear history of CHF, HTN, DM admitted with SOB and leg edema, and abdominal swelling. No chest pain. Symptoms have progressed over the last several days. The patient is unsure of prior cardiac history, however his family states they believe he has been told his heart is weak.   CXR cardiomegaly and  mild interstitial edema, BNP 1756, K 4.8, Cr 1.2, trop neg EKG with NSR and LBBB, unclear chronicity.  Echo LVEF 15%, grade II diastolic dysfunction No Known Allergies  Medications Scheduled Medications: . atorvastatin  40 mg Oral Daily  . carvedilol  3.125 mg Oral BID WC  . clopidogrel  75 mg Oral Daily  . digoxin  0.125 mg Oral Daily  . enalapril  2.5 mg Oral Daily  . enoxaparin (LOVENOX) injection  40 mg Subcutaneous Q24H  . furosemide  100 mg Intravenous Q12H  . gabapentin  300 mg Oral TID  . insulin aspart  0-5 Units Subcutaneous QHS  . insulin aspart  0-9 Units Subcutaneous TID WC  . insulin aspart  10 Units Subcutaneous TID WC  . [START ON 04/22/2014] insulin glargine  65 Units Subcutaneous Daily  . polyethylene glycol  17 g Oral BID  . sodium chloride  3 mL Intravenous Q12H     Infusions:     PRN Medications:  sodium chloride, acetaminophen, morphine injection, ondansetron (ZOFRAN) IV, sodium chloride, zolpidem   Past Medical History  Diagnosis Date  . Hypertension   . Diabetes mellitus without complication   . CHF (congestive heart failure)     Past Surgical History  Procedure Laterality Date  . Kidney stone surgery      Family History  Problem Relation Age of Onset  . Diabetes Mother     Social History Stanley Farmer reports that he has quit smoking. He does not have any smokeless tobacco history on file. Stanley Farmer reports that he does not drink  alcohol.  Review of Systems CONSTITUTIONAL: No weight loss, fever, chills, weakness or fatigue.  HEENT: Eyes: No visual loss, blurred vision, double vision or yellow sclerae. No hearing loss, sneezing, congestion, runny nose or sore throat.  SKIN: No rash or itching.  CARDIOVASCULAR: per HPI RESPIRATORY: per HPI.  GASTROINTESTINAL: No anorexia, nausea, vomiting or diarrhea. No abdominal pain or blood.  GENITOURINARY: no polyuria, no dysuria NEUROLOGICAL: No headache, dizziness, syncope, paralysis, ataxia, numbness or tingling in the extremities. No change in bowel or bladder control.  MUSCULOSKELETAL: No muscle, back pain, joint pain or stiffness.  HEMATOLOGIC: No anemia, bleeding or bruising.  LYMPHATICS: No enlarged nodes. No history of splenectomy.  PSYCHIATRIC: No history of depression or anxiety.      Physical Examination Blood pressure 130/61, pulse 73, temperature 98.8 F (37.1 C), temperature source Oral, resp. rate 20, height 5\' 6"  (1.676 m), weight 184 lb 12.8 oz (83.825 kg), SpO2 97.00%.  Intake/Output Summary (Last 24 hours) at 04/21/14 1533 Last data filed at 04/21/14 0900  Gross per 24 hour  Intake    240 ml  Output   1700 ml  Net  -1460 ml    HEENT: sclera clear  Cardiovascular: RRR, no m/r/g, JVD just below angle of jaw  Respiratory: bilateral crackles  GI: abdomen soft, NT  MSK: trace bilateral edema  Neuro: no focal deficits  Psych: appropriate affect   Lab Results  Basic Metabolic Panel:  Recent Labs Lab 04/20/14 2350  NA 142  K 4.8  CL 102  CO2 28  GLUCOSE 225*  BUN 15  CREATININE 1.20  CALCIUM 8.8    Liver Function Tests:  Recent Labs Lab 04/20/14 2350  AST 37  ALT 34  ALKPHOS 67  BILITOT 0.6  PROT 7.0  ALBUMIN 3.8    CBC:  Recent Labs Lab 04/20/14 2350  WBC 6.3  HGB 13.4  HCT 39.4  MCV 91.6  PLT 155    Cardiac Enzymes: No results found for this basename: CKTOTAL, CKMB, CKMBINDEX, TROPONINI,  in the last 168  hours  BNP: No components found with this basename: POCBNP,    ECG   Imaging 04/2014 Echo Study Conclusions  - Left ventricle: The cavity size was moderately dilated. Wall thickness was increased in a pattern of mild LVH. The estimated ejection fraction was 15%. Diffuse hypokinesis. Features are consistent with a pseudonormal left ventricular filling pattern, with concomitant abnormal relaxation and increased filling pressure (grade 2 diastolic dysfunction). Doppler parameters are consistent with high ventricular filling pressure. - Aortic valve: There was trivial regurgitation. - Aortic root: The aortic root was mildly dilated. - Mitral valve: Calcified annulus. There was mild regurgitation. - Left atrium: The atrium was severely dilated. - Right ventricle: The cavity size was mildly dilated. - Right atrium: The atrium was mildly dilated. - Pericardium, extracardiac: A trivial pericardial effusion was identified.  Impressions:  - Severe global LV dysfunction; grade 2 diastolic dysfunction; severe LAE; mild RAE/RVE; mild MR; trace AI.    Impression/Recommendations 1. Systolic heart failure - likely acute on chronic, family reports they believe his primary doctor has mentioned a weak heart before. His medication regimen would also suggest he has been managed for systolic heart failure. He does not have a regular cardiologist per his report - evidence of volume overload, agree with continuing IV diuretics. Continue home medications. - He lives in [place] and is seeing a primary care doctor there named Arlan Organ at [facility] phone number [phone]. Will need to contact his office Monday for records regarding his cardiac history. I also do not know why he is on plavix     Dina Rich, M.D., F.A.C.C.

## 2014-04-22 ENCOUNTER — Inpatient Hospital Stay (HOSPITAL_COMMUNITY): Payer: Medicare Other

## 2014-04-22 DIAGNOSIS — I428 Other cardiomyopathies: Secondary | ICD-10-CM

## 2014-04-22 DIAGNOSIS — I420 Dilated cardiomyopathy: Secondary | ICD-10-CM

## 2014-04-22 HISTORY — DX: Dilated cardiomyopathy: I42.0

## 2014-04-22 LAB — BASIC METABOLIC PANEL
Anion gap: 12 (ref 5–15)
BUN: 18 mg/dL (ref 6–23)
CO2: 29 mEq/L (ref 19–32)
CREATININE: 1.26 mg/dL (ref 0.50–1.35)
Calcium: 8.8 mg/dL (ref 8.4–10.5)
Chloride: 102 mEq/L (ref 96–112)
GFR calc Af Amer: 64 mL/min — ABNORMAL LOW (ref >90)
GFR calc non Af Amer: 55 mL/min — ABNORMAL LOW (ref >90)
Glucose, Bld: 85 mg/dL (ref 70–99)
Potassium: 3.6 mEq/L — ABNORMAL LOW (ref 3.7–5.3)
Sodium: 143 mEq/L (ref 137–147)

## 2014-04-22 LAB — GLUCOSE, CAPILLARY
Glucose-Capillary: 103 mg/dL — ABNORMAL HIGH (ref 70–99)
Glucose-Capillary: 212 mg/dL — ABNORMAL HIGH (ref 70–99)
Glucose-Capillary: 152 mg/dL — ABNORMAL HIGH (ref 70–99)
Glucose-Capillary: 87 mg/dL (ref 70–99)

## 2014-04-22 MED ORDER — ALUM & MAG HYDROXIDE-SIMETH 200-200-20 MG/5ML PO SUSP: 30.0000 mL | Freq: Four times a day (QID) | ORAL | Status: DC | PRN

## 2014-04-22 MED ORDER — INSULIN GLARGINE 100 UNIT/ML ~~LOC~~ SOLN
70.0000 [IU] | Freq: Every day | SUBCUTANEOUS | Status: DC
  Administered 2014-04-23 – 2014-04-24 (×2): 70 [IU] via SUBCUTANEOUS
  Filled 2014-04-22 (×2): qty 0.7

## 2014-04-22 NOTE — Progress Notes (Signed)
[id number] Dr Rito Ehrlich called back   Updated about pt . Remained with vague abdonina; discomfort .With orders

## 2014-04-22 NOTE — Progress Notes (Signed)
Patient seen and examined and chart reviewed. Agree with note as outlined by [name], PA-C.  Admitted with acute on chronic CHF.  2D echo showed EF 15%.  Unclear as to whether this is a new finding.  Apparently by Hospitalist note, he has seen a Cardiologist remotely and is on CHF meds at home so this may not be a new finding.  We will get records from [place].  If he has not had any type of ischemic workup we will need to proceed with that.  For now continue IV Lasix and reassess in am.

## 2014-04-22 NOTE — Progress Notes (Signed)
Provided grand child with Spanish version of living better with heart failure . Verbalized understanding by teach back . Explained back to her grand parents

## 2014-04-22 NOTE — Progress Notes (Signed)
Patient Name: Stanley Farmer Date of Encounter: 04/22/2014  Principal Problem:   Acute respiratory failure Active Problems:   Acute on chronic combined systolic and diastolic CHF (congestive heart failure)   Essential hypertension   IDDM (insulin dependent diabetes mellitus)   Non-English speaking patient   DM type 2 with diabetic peripheral neuropathy   CKD (chronic kidney disease), stage II   Overweight    Patient Profile: 73 yo male w/ hx "weak heart", HTN, DM and CHF, admitted 07/04 w/ CHF. Echo w/ EF 15%. Old records pending.   SUBJECTIVE: Breathing better than on admission, still feels he needs to have oxygen  OBJECTIVE Filed Vitals:   04/21/14 1415 04/21/14 2108 04/22/14 0523 04/22/14 1006  BP: 130/61 122/63 111/65 117/59  Pulse: 73 63 66 65  Temp: 98.8 F (37.1 C) 98.6 F (37 C) 98 F (36.7 C)   TempSrc: Oral Oral Axillary   Resp:  18 18 16   Height:      Weight:   183 lb (83.008 kg)   SpO2: 97% 99% 99% 95%    Intake/Output Summary (Last 24 hours) at 04/22/14 1057 Last data filed at 04/22/14 1034  Gross per 24 hour  Intake    960 ml  Output    350 ml  Net    610 ml   Filed Weights   04/20/14 2328 04/21/14 0259 04/22/14 0523  Weight: 186 lb (84.369 kg) 184 lb 12.8 oz (83.825 kg) 183 lb (83.008 kg)    PHYSICAL EXAM General: Well developed, well nourished, male in no acute distress. Head: Normocephalic, atraumatic.  Neck: Supple without bruits, JVD at 10 cm. Lungs:  Resp regular and unlabored, decreased BS bases with some rales. Heart: RRR, S1, S2, no S3, S4, 2/6 murmur; no rub. Abdomen: Soft, non-tender, non-distended, BS + x 4.  Extremities: No clubbing, cyanosis, no edema.  Neuro: Alert and oriented X 3. Moves all extremities spontaneously. Psych: Normal affect.  LABS: CBC:  Recent Labs  04/20/14 2350  WBC 6.3  HGB 13.4  HCT 39.4  MCV 91.6  PLT 155   Basic Metabolic Panel:  Recent Labs  41/58/30 2350 04/22/14 0258  NA 142 143   K 4.8 3.6*  CL 102 102  CO2 28 29  GLUCOSE 225* 85  BUN 15 18  CREATININE 1.20 1.26  CALCIUM 8.8 8.8   Liver Function Tests:  Recent Labs  04/20/14 2350  AST 37  ALT 34  ALKPHOS 67  BILITOT 0.6  PROT 7.0  ALBUMIN 3.8    Recent Labs  04/20/14 2357  TROPIPOC 0.01   BNP: Pro B Natriuretic peptide (BNP)  Date/Time Value Ref Range Status  04/20/2014 11:50 PM 1756.0* 0 - 125 pg/mL Final   Hemoglobin A1C:  Recent Labs  04/21/14 0735  HGBA1C 7.1*   TELE:  SR, occ PVCs  Radiology/Studies: Dg Chest Port 1 View 04/21/2014   CLINICAL DATA:  Shortness of breath, chest pain.  EXAM: PORTABLE CHEST - 1 VIEW  COMPARISON:  None.  FINDINGS: Cardiomegaly. No confluent airspace opacities or effusions. Mild interstitial prominence and Kerley B-lines in the lung bases concerning for early interstitial edema. No acute bony abnormality.  IMPRESSION: Cardiomegaly, suspect early interstitial edema.   Electronically Signed   By: Charlett Nose M.D.   On: 04/21/2014 00:09   Current Medications:  . atorvastatin  40 mg Oral Daily  . carvedilol  3.125 mg Oral BID WC  . clopidogrel  75 mg Oral Daily  .  digoxin  0.125 mg Oral Daily  . enalapril  2.5 mg Oral Daily  . enoxaparin (LOVENOX) injection  40 mg Subcutaneous Q24H  . furosemide  100 mg Intravenous Q12H  . gabapentin  300 mg Oral TID  . insulin aspart  0-5 Units Subcutaneous QHS  . insulin aspart  0-9 Units Subcutaneous TID WC  . insulin aspart  10 Units Subcutaneous TID WC  . insulin glargine  65 Units Subcutaneous Daily  . polyethylene glycol  17 g Oral BID  . sodium chloride  3 mL Intravenous Q12H      ASSESSMENT AND PLAN: Principal Problem:   Acute respiratory failure - see below, otherwise, per IM  Active Problems:   Acute on chronic combined systolic and diastolic CHF (congestive heart failure) - would continue IV Lasix today, possibly change to PO in am. Use oxygen today, ambulate w/out O2 in am and see how tolerated. Family  says they will get scales when he goes home, emphasized the need to weigh daily.    Essential hypertension - currently good control    IDDM (insulin dependent diabetes mellitus) - per IM    Non-English speaking patient - Spanish-speaking, granddaughter helped translate today    DM type 2 with diabetic peripheral neuropathy - per IM    CKD (chronic kidney disease), stage II - watch w/ diuresis    Overweight - per IM  Signed, Theodore Demarkhonda Barrett , PA-C 10:57 AM 04/22/2014

## 2014-04-22 NOTE — Care Management Note (Addendum)
  Page 2 of 2   04/24/2014     4:35:07 PM CARE MANAGEMENT NOTE 04/24/2014  Patient:  Stanley Farmer, Stanley Farmer   Account Number:  [id number]  Date Initiated:  04/22/2014  Documentation initiated by:  Tavaras Goody  Subjective/Objective Assessment:   CHF     Action/Plan:   CM to follow for dispositon needs   Anticipated DC Date:  04/25/2014   Anticipated DC Plan:  HOME/SELF CARE      DC Planning Services  CM consult      [facility] Choice  HOME HEALTH   Choice offered to / List presented to:  C-1 Patient        HH arranged  HH-1 RN  HH-10 DISEASE MANAGEMENT      HH agency  [facility]   Status of service:  Completed, signed off Medicare Important Message given?  YES (If response is "NO", the following Medicare IM given date fields will be blank) Date Medicare IM given:  04/23/2014 Medicare IM given by:  Caela Huot Date Additional Medicare IM given:  04/24/2014 Additional Medicare IM given by:  Jilleen Essner  Discharge Disposition:  HOME W HOME HEALTH SERVICES  Per UR Regulation:  Reviewed for med. necessity/level of care/duration of stay  If discussed at Long Length of Stay Meetings, dates discussed:    Comments:  Linzie Boursiquot RN, BSN, MSHL, CCM  Nurse - Case Manager,  (Unit [place])  [phone]  04/24/2014 Initiam IM by admissions on 04/20/14 EF 15% Social:  From home with family; speaks Spanish only and does not read or write. Stanley Farmer Son [phone] speaks English. PT RECS: no PT needed. Disposition Plan:  HHS:  RN - CHF mgmt, telemonitoring medication complance, appt compliance and asssitance with scheduling d/t language barriers. Cardiologist: Dr. Mayford Knife PCP: Dr. Modesto Charon [facility] notifed.

## 2014-04-22 NOTE — Progress Notes (Signed)
[facility] PROGRESS NOTE  Stanley Farmer [id number] DOB: 04/29/1941 DOA: 04/20/2014 PCP: No primary provider on file.  Assessment/Plan: Principal Problem:   Acute on chronic combined systolic and diastolic CHF (congestive heart failure): Diuresed 1.4 L already. Echocardiogram notes markedly depressed ejection fraction plus diastolic dysfunction. [place] in [place], but has not seen him in a very long time. Based off of his medications including beta blocker, ACE inhibitor, digoxin and diuretic, this is clearly not a new diagnosis. Have [facility] cardiology for help in managementand they are getting his office records. Likely he will need strong and close follow up as outpatient. Tried to convey to family the seriousness of his heart condition. His acute exacerbation actually looks to be quite mild Active Problems:   Acute respiratory failure: Resolved. Patient breathing much more comfortably      Essential hypertension: Continue his home medications.    Non-English speaking patient: Medical interpreter used    DM type 2 with diabetic peripheral neuropathy: Despite aggressively high insulin coverage with Lantus and 3 times a day NovoLog, patient's A1c actually shows good control. Continue Neurontin. I initially scaled down his NovoLog and his Lantus as I suspect his insulin needs may go down as he is diuresed, mild increase in his Lantus dose on 7/6 as a CBGs are starting to trend up.    CKD (chronic kidney disease), stage II: Looks to be stable, with creatininestaying around 1.2.  Overweight: Patient needs criteria with a BMI 29   Code Status: Full code Family Communication: Multiple family members at bedside.  Disposition Plan: Despite overall poor function of heart, only mildly volume overloaded. possibly home tomorrow   Consultants:  Cardiology  Procedures:  Echocardiogram done 7/5: Grade 2 diastolic dysfunction with a significantly  decreased ejection fraction of 15%  Antibiotics:  None  HPI/Subjective: Through family and interpreter, patient feeling better. Breathing continues to improve. No chest pain  Objective: Filed Vitals:   04/22/14 1006  BP: 117/59  Pulse: 65  Temp:   Resp: 16    Intake/Output Summary (Last 24 hours) at 04/22/14 1359 Last data filed at 04/22/14 1034  Gross per 24 hour  Intake    960 ml  Output    350 ml  Net    610 ml   Filed Weights   04/20/14 2328 04/21/14 0259 04/22/14 0523  Weight: 84.369 kg (186 lb) 83.825 kg (184 lb 12.8 oz) 83.008 kg (183 lb)    Exam:   General:  Alert and oriented x3, currently no acute distress  Cardiovascular: Soft, regular rate and rhythm, S1-S2, soft 2/6 systolic ejection murmur  Respiratory: Moderate inspiratory effort, auscultation bilaterally  Abdomen: Soft, obese, nontender, positive bowel sounds  Musculoskeletal: No clubbing or cyanosis, 1+ pitting edema from the knees down   Data Reviewed: Basic Metabolic Panel:  Recent Labs Lab 04/20/14 2350 04/22/14 0258  NA 142 143  K 4.8 3.6*  CL 102 102  CO2 28 29  GLUCOSE 225* 85  BUN 15 18  CREATININE 1.20 1.26  CALCIUM 8.8 8.8   Liver Function Tests:  Recent Labs Lab 04/20/14 2350  AST 37  ALT 34  ALKPHOS 67  BILITOT 0.6  PROT 7.0  ALBUMIN 3.8    Recent Labs Lab 04/20/14 2350  LIPASE 29   No results found for this basename: AMMONIA,  in the last 168 hours CBC:  Recent Labs Lab 04/20/14 2350  WBC 6.3  HGB 13.4  HCT 39.4  MCV 91.6  PLT 155   Cardiac Enzymes: No results found for this basename: CKTOTAL, CKMB, CKMBINDEX, TROPONINI,  in the last 168 hours BNP (last 3 results)  Recent Labs  04/20/14 2350  PROBNP 1756.0*   CBG:  Recent Labs Lab 04/21/14 1202 04/21/14 1620 04/21/14 1957 04/22/14 0629 04/22/14 1202  GLUCAP 202* 90 177* 103* 212*    No results found for this or any previous visit (from the past 240 hour(s)).   Studies: Dg  Chest Port 1 View  04/21/2014   CLINICAL DATA:  Shortness of breath, chest pain.  EXAM: PORTABLE CHEST - 1 VIEW  COMPARISON:  None.  FINDINGS: Cardiomegaly. No confluent airspace opacities or effusions. Mild interstitial prominence and Kerley B-lines in the lung bases concerning for early interstitial edema. No acute bony abnormality.  IMPRESSION: Cardiomegaly, suspect early interstitial edema.   Electronically Signed   By: Charlett Nose M.D.   On: 04/21/2014 00:09    Scheduled Meds: . atorvastatin  40 mg Oral Daily  . carvedilol  3.125 mg Oral BID WC  . clopidogrel  75 mg Oral Daily  . digoxin  0.125 mg Oral Daily  . enalapril  2.5 mg Oral Daily  . enoxaparin (LOVENOX) injection  40 mg Subcutaneous Q24H  . furosemide  100 mg Intravenous Q12H  . gabapentin  300 mg Oral TID  . insulin aspart  0-5 Units Subcutaneous QHS  . insulin aspart  0-9 Units Subcutaneous TID WC  . insulin aspart  10 Units Subcutaneous TID WC  . insulin glargine  65 Units Subcutaneous Daily  . polyethylene glycol  17 g Oral BID  . sodium chloride  3 mL Intravenous Q12H   Continuous Infusions:   Principal Problem:   Acute on chronic combined systolic and diastolic CHF (congestive heart failure) Active Problems:   Acute respiratory failure   Essential hypertension   IDDM (insulin dependent diabetes mellitus)   Non-English speaking patient   DM type 2 with diabetic peripheral neuropathy   CKD (chronic kidney disease), stage II    Time spent: 15 minutes    Hollice Espy  [facility] Pager [phone]. If 7PM-7AM, please contact night-coverage at [email or web address], password [facility] 04/22/2014, 1:59 PM  LOS: 2 days

## 2014-04-22 NOTE — Progress Notes (Signed)
UR completed Zamari Bonsall K. Dominic Rhome, RN, BSN, MSHL, CCM  04/22/2014 4:44 PM

## 2014-04-22 NOTE — Progress Notes (Signed)
1618 Pt complaint of R upper quadrant pain on palpation started about one hour ago . Non extending Referred to MD

## 2014-04-23 LAB — GLUCOSE, CAPILLARY
GLUCOSE-CAPILLARY: 141 mg/dL — AB (ref 70–99)
GLUCOSE-CAPILLARY: 221 mg/dL — AB (ref 70–99)
Glucose-Capillary: 115 mg/dL — ABNORMAL HIGH (ref 70–99)
Glucose-Capillary: 107 mg/dL — ABNORMAL HIGH (ref 70–99)

## 2014-04-23 LAB — BASIC METABOLIC PANEL
Anion gap: 10 (ref 5–15)
BUN: 23 mg/dL (ref 6–23)
CALCIUM: 9.2 mg/dL (ref 8.4–10.5)
CO2: 31 mEq/L (ref 19–32)
CREATININE: 1.31 mg/dL (ref 0.50–1.35)
Chloride: 101 mEq/L (ref 96–112)
GFR calc Af Amer: 61 mL/min — ABNORMAL LOW (ref >90)
GFR calc non Af Amer: 52 mL/min — ABNORMAL LOW (ref >90)
Glucose, Bld: 105 mg/dL — ABNORMAL HIGH (ref 70–99)
Potassium: 4.3 mEq/L (ref 3.7–5.3)
Sodium: 142 mEq/L (ref 137–147)

## 2014-04-23 MED ORDER — FLEET ENEMA 7-19 GM/118ML RE ENEM
1.0000 | ENEMA | Freq: Once | RECTAL | Status: AC
  Administered 2014-04-23: 1 via RECTAL
  Filled 2014-04-23: qty 1

## 2014-04-23 MED ORDER — FUROSEMIDE 80 MG PO TABS
80.0000 mg | ORAL_TABLET | Freq: Two times a day (BID) | ORAL | Status: DC
  Administered 2014-04-24 (×2): 80 mg via ORAL
  Filled 2014-04-23 (×5): qty 1

## 2014-04-23 MED ORDER — REGADENOSON 0.4 MG/5ML IV SOLN
0.4000 mg | Freq: Once | INTRAVENOUS | Status: AC
  Administered 2014-04-24: 0.4 mg via INTRAVENOUS
  Filled 2014-04-23: qty 5

## 2014-04-23 NOTE — Progress Notes (Signed)
[facility] PROGRESS NOTE  Stanley Farmer TDD:[id number] DOB: January 07, 1941 DOA: 04/20/2014 PCP: [name], MD  Interim summary:  73 year old non-English speaking Hispanic male presented to emergency room on 7/4 with CHF exacerbation. He follows with a PCP in [place] but apparently has not seen a cardiologist in many years. Notes were obtained from primary care office, but even those unclear as to what cardiac workup patient has done so far. Echocardiogram noted grade 2 diastolic dysfunction and an ejection  Clear to auscultation bilaterally trace pittingfraction of 15%. The patient himself and the family appear to be not very well educated in the patient's diagnosis. That said, patient's CHF exacerbation was mild and he is already on the appropriate medications including diuretics, ACE, beta blocker and Plavix  Assessment/Plan: Principal Problem:   Acute on chronic combined systolic and diastolic CHF (congestive heart failure): Felt to be down to dry weight. Exacerbation itself quite mild. Down now 8 pounds. Echocardiogram notes markedly depressed ejection fraction plus diastolic dysfunction. Have involved [facility] cardiology for help in managementand. After review of PCP records, still unclear what type of workup patient has done. Discussed with cardiology and plan will be for stress test on 7/8. Likely he will need strong and close follow up as outpatient.   Active Problems:   Acute respiratory failure: Resolved. Patient breathing much more comfortably      Essential hypertension: Continue his home medications.    Non-English speaking patient: Medical interpreter used    DM type 2 with diabetic peripheral neuropathy: Despite aggressively high insulin coverage with Lantus and 3 times a day NovoLog, patient's A1c actually shows good control. Continue Neurontin. I initially scaled down his NovoLog and his Lantus as I suspect his insulin needs may go down as he is diuresed, mild increase in  his Lantus dose on 7/6 as a CBGs are starting to trend up.    CKD (chronic kidney disease), stage II: Looks to be stable, with creatinine staying around 1.2.  Overweight: Patient meets criteria with a BMI 29   Code Status: Full code Family Communication: Grandson and wife at bedside today.  Disposition Plan: Stress test tomorrow. Discharge likely in the next 24-48 hours   Consultants:  Cardiology  Procedures:  Echocardiogram done 7/5: Grade 2 diastolic dysfunction with a significantly decreased ejection fraction of 15%  Antibiotics:  None  HPI/Subjective: Through family and interpreter, patient feeling better. Breathing doing okay.  Objective: Filed Vitals:   04/23/14 1500  BP: 102/59  Pulse: 61  Temp: 97.9 F (36.6 C)  Resp: 18    Intake/Output Summary (Last 24 hours) at 04/23/14 2118 Last data filed at 04/23/14 1829  Gross per 24 hour  Intake    840 ml  Output    401 ml  Net    439 ml   Filed Weights   04/21/14 0259 04/22/14 0523 04/23/14 0519  Weight: 83.825 kg (184 lb 12.8 oz) 83.008 kg (183 lb) 81.103 kg (178 lb 12.8 oz)    Exam:   General:  Alert and oriented x3, currently no acute distress  Cardiovascular: Soft, regular rate and rhythm, S1-S2, soft 2/6 systolic ejection murmur  Respiratory: Moderate inspiratory effort, auscultation bilaterally  Abdomen: Soft, obese, nontender, positive bowel sounds  Musculoskeletal: No clubbing or cyanosis, 1+ pitting edema from the knees down   Data Reviewed: Basic Metabolic Panel:  Recent Labs Lab 04/20/14 2350 04/22/14 0258 04/23/14 0755  NA 142 143 142  K 4.8 3.6* 4.3  CL 102 102 101  CO2 28 29 31   GLUCOSE 225* 85 105*  BUN 15 18 23   CREATININE 1.20 1.26 1.31  CALCIUM 8.8 8.8 9.2   Liver Function Tests:  Recent Labs Lab 04/20/14 2350  AST 37  ALT 34  ALKPHOS 67  BILITOT 0.6  PROT 7.0  ALBUMIN 3.8    Recent Labs Lab 04/20/14 2350  LIPASE 29   No results found for this basename:  AMMONIA,  in the last 168 hours CBC:  Recent Labs Lab 04/20/14 2350  WBC 6.3  HGB 13.4  HCT 39.4  MCV 91.6  PLT 155   Cardiac Enzymes: No results found for this basename: CKTOTAL, CKMB, CKMBINDEX, TROPONINI,  in the last 168 hours BNP (last 3 results)  Recent Labs  04/20/14 2350  PROBNP 1756.0*   CBG:  Recent Labs Lab 04/22/14 1708 04/22/14 2247 04/23/14 0638 04/23/14 1210 04/23/14 1558  GLUCAP 152* 87 115* 141* 107*    No results found for this or any previous visit (from the past 240 hour(s)).   Studies: Dg Chest Port 1 View  04/21/2014   CLINICAL DATA:  Shortness of breath, chest pain.  EXAM: PORTABLE CHEST - 1 VIEW  COMPARISON:  None.  FINDINGS: Cardiomegaly. No confluent airspace opacities or effusions. Mild interstitial prominence and Kerley B-lines in the lung bases concerning for early interstitial edema. No acute bony abnormality.  IMPRESSION: Cardiomegaly, suspect early interstitial edema.   Electronically Signed   By: Charlett NoseKevin  Dover M.D.   On: 04/21/2014 00:09    Scheduled Meds: . atorvastatin  40 mg Oral Daily  . carvedilol  3.125 mg Oral BID WC  . clopidogrel  75 mg Oral Daily  . digoxin  0.125 mg Oral Daily  . enalapril  2.5 mg Oral Daily  . enoxaparin (LOVENOX) injection  40 mg Subcutaneous Q24H  . furosemide  80 mg Oral BID  . gabapentin  300 mg Oral TID  . insulin aspart  0-5 Units Subcutaneous QHS  . insulin aspart  0-9 Units Subcutaneous TID WC  . insulin aspart  10 Units Subcutaneous TID WC  . insulin glargine  70 Units Subcutaneous Daily  . polyethylene glycol  17 g Oral BID  . [START ON 04/24/2014] regadenoson  0.4 mg Intravenous Once  . sodium chloride  3 mL Intravenous Q12H   Continuous Infusions:   Principal Problem:   Acute on chronic combined systolic and diastolic CHF (congestive heart failure) Active Problems:   Acute respiratory failure   Essential hypertension   IDDM (insulin dependent diabetes mellitus)   Non-English speaking  patient   DM type 2 with diabetic peripheral neuropathy   CKD (chronic kidney disease), stage II    Time spent: 15 minutes    Hollice EspyKRISHNAN,Khilee Hendricksen K  [facility] Pager [phone]. If 7PM-7AM, please contact night-coverage at [email or web address], password [email or web address] 04/23/2014, 9:18 PM  LOS: 3 days

## 2014-04-23 NOTE — Progress Notes (Signed)
SUBJECTIVE:  SOB has resolved.  Denies CP  OBJECTIVE:   Vitals:   Filed Vitals:   04/22/14 2316 04/23/14 0519 04/23/14 1100 04/23/14 1113  BP: 110/61 122/65 120/56   Pulse: 62 63 60   Temp: 97.6 F (36.4 C) 97.4 F (36.3 C)    TempSrc: Oral Oral    Resp: 18 18    Height:      Weight:  178 lb 12.8 oz (81.103 kg)    SpO2: 98% 98% 99% 96%   I&O's:   Intake/Output Summary (Last 24 hours) at 04/23/14 1159 Last data filed at 04/23/14 [id number]  Gross per 24 hour  Intake    700 ml  Output    275 ml  Net    425 ml   TELEMETRY: Reviewed telemetry pt in NSR:     PHYSICAL EXAM General: Well developed, well nourished, in no acute distress Head: Eyes PERRLA, No xanthomas.   Normal cephalic and atramatic  Lungs:   Clear bilaterally to auscultation and percussion. Heart:   HRRR S1 S2 Pulses are 2+ & equal. Abdomen: Bowel sounds are positive, abdomen soft and non-tender without masses Extremities:   No clubbing, cyanosis or edema.  DP +1 Neuro: Alert and oriented X 3. Psych:  Good affect, responds appropriately   LABS: Basic Metabolic Panel:  Recent Labs  19/14/7806/04/01 0258 04/23/14 0755  NA 143 142  K 3.6* 4.3  CL 102 101  CO2 29 31  GLUCOSE 85 105*  BUN 18 23  CREATININE 1.26 1.31  CALCIUM 8.8 9.2   Liver Function Tests:  Recent Labs  04/20/14 2350  AST 37  ALT 34  ALKPHOS 67  BILITOT 0.6  PROT 7.0  ALBUMIN 3.8    Recent Labs  04/20/14 2350  LIPASE 29   CBC:  Recent Labs  04/20/14 2350  WBC 6.3  HGB 13.4  HCT 39.4  MCV 91.6  PLT 155   Cardiac Enzymes: No results found for this basename: CKTOTAL, CKMB, CKMBINDEX, TROPONINI,  in the last 72 hours BNP: No components found with this basename: POCBNP,  D-Dimer: No results found for this basename: DDIMER,  in the last 72 hours Hemoglobin A1C:  Recent Labs  04/21/14 0735  HGBA1C 7.1*   Fasting Lipid Panel: No results found for this basename: CHOL, HDL, LDLCALC, TRIG, CHOLHDL, LDLDIRECT,  in the  last 72 hours Thyroid Function Tests: No results found for this basename: TSH, T4TOTAL, FREET3, T3FREE, THYROIDAB,  in the last 72 hours Anemia Panel: No results found for this basename: VITAMINB12, FOLATE, FERRITIN, TIBC, IRON, RETICCTPCT,  in the last 72 hours Coag Panel:   No results found for this basename: INR, PROTIME    RADIOLOGY: Dg Chest Port 1 View  04/21/2014   CLINICAL DATA:  Shortness of breath, chest pain.  EXAM: PORTABLE CHEST - 1 VIEW  COMPARISON:  None.  FINDINGS: Cardiomegaly. No confluent airspace opacities or effusions. Mild interstitial prominence and Kerley B-lines in the lung bases concerning for early interstitial edema. No acute bony abnormality.  IMPRESSION: Cardiomegaly, suspect early interstitial edema.   Electronically Signed   By: Charlett NoseKevin  Dover M.D.   On: 04/21/2014 00:09   Dg Abd Portable 1v  04/23/2014   CLINICAL DATA:  Abdominal pain and distention.  EXAM: PORTABLE ABDOMEN - 1 VIEW  COMPARISON:  None.  FINDINGS: No evidence of dilated bowel loops. Moderate stool burden noted. Right upper quadrant surgical clips are seen from prior cholecystectomy.  IMPRESSION: No acute findings.  Electronically Signed   By: Myles Rosenthal M.D.   On: 04/23/2014 01:18   ASSESSMENT AND PLAN:  Principal Problem:  Acute respiratory failure - see below, otherwise, per IM  Active Problems:  Acute on chronic combined systolic and diastolic CHF (congestive heart failure) - will change to PO Lasix today.   Family says they will get scales when he goes home, emphasized the need to weigh daily. Continue low dose BB/dig/low dose ACE I.  Will try to get records from his PCP today.  If EF has been low in the past no further workup.   Essential hypertension - currently good control  IDDM (insulin dependent diabetes mellitus) - per IM  Non-English speaking patient - Spanish-speaking, granddaughter helped translate today  DM type 2 with diabetic peripheral neuropathy - per IM  CKD (chronic kidney  disease), stage II - watch w/ diuresis  Overweight - per IM     Quintella Reichert, MD  04/23/2014  11:59 AM

## 2014-04-23 NOTE — Progress Notes (Signed)
The patient stated that he had urinated five times since the last time that he used the urinal.  Re-educated the patient on the importance of using the urinal.  He verbalized his understanding.

## 2014-04-24 ENCOUNTER — Inpatient Hospital Stay (HOSPITAL_COMMUNITY): Payer: Medicare Other

## 2014-04-24 DIAGNOSIS — I428 Other cardiomyopathies: Secondary | ICD-10-CM

## 2014-04-24 LAB — BASIC METABOLIC PANEL
Anion gap: 11 (ref 5–15)
BUN: 23 mg/dL (ref 6–23)
CO2: 30 mEq/L (ref 19–32)
Calcium: 9.3 mg/dL (ref 8.4–10.5)
Chloride: 100 mEq/L (ref 96–112)
Creatinine, Ser: 1.19 mg/dL (ref 0.50–1.35)
GFR calc Af Amer: 68 mL/min — ABNORMAL LOW (ref >90)
GFR, EST NON AFRICAN AMERICAN: 59 mL/min — AB (ref >90)
Glucose, Bld: 118 mg/dL — ABNORMAL HIGH (ref 70–99)
Potassium: 3.7 mEq/L (ref 3.7–5.3)
SODIUM: 141 meq/L (ref 137–147)

## 2014-04-24 LAB — GLUCOSE, CAPILLARY
Glucose-Capillary: 118 mg/dL — ABNORMAL HIGH (ref 70–99)
Glucose-Capillary: 150 mg/dL — ABNORMAL HIGH (ref 70–99)
Glucose-Capillary: 148 mg/dL — ABNORMAL HIGH (ref 70–99)

## 2014-04-24 MED ORDER — REGADENOSON 0.4 MG/5ML IV SOLN
INTRAVENOUS | Status: AC
  Administered 2014-04-24: 0.4 mg via INTRAVENOUS
  Filled 2014-04-24: qty 5

## 2014-04-24 MED ORDER — FUROSEMIDE 40 MG PO TABS: 80.0000 mg | ORAL_TABLET | Freq: Two times a day (BID) | ORAL | Status: DC

## 2014-04-24 MED ORDER — TECHNETIUM TC 99M SESTAMIBI GENERIC - CARDIOLITE
10.0000 | Freq: Once | INTRAVENOUS | Status: AC | PRN
  Administered 2014-04-24: 10 via INTRAVENOUS

## 2014-04-24 MED ORDER — TECHNETIUM TC 99M SESTAMIBI GENERIC - CARDIOLITE
30.0000 | Freq: Once | INTRAVENOUS | Status: AC | PRN
  Administered 2014-04-24: 30 via INTRAVENOUS

## 2014-04-24 MED ORDER — FUROSEMIDE 80 MG PO TABS: 80.0000 mg | ORAL_TABLET | Freq: Two times a day (BID) | ORAL | Status: DC

## 2014-04-24 NOTE — Progress Notes (Signed)
Physical Therapy Evaluation Patient Details Name: Stanley Farmer MRN: [id number] DOB: Jan 16, 1941 Today's Date: 04/24/2014   History of Present Illness  73 y.o. spanish speaking male admitted to [facility] on 04/20/14 with bil leg edema and SOB.  Dx with acute respiratory failure and acute on chronic CHF.  Pt with significant PMHx of DM, HTN, CHF, bil knee arthritis.   Clinical Impression  Pt is mobilizing at his baseline, vitals stable on RA without signs of DOE.  PT has no acute or f/u PT needs at this time.  PT will sign off.   Follow Up Recommendations No PT follow up    Equipment Recommendations  None recommended by PT    Recommendations for Other Services   NA        Mobility  Bed Mobility Overal bed mobility: Modified Independent                Transfers Overall transfer level: Needs assistance Equipment used: 1 person hand held assist             General transfer comment: min guard assist to steady pt on his feet  Ambulation/Gait Ambulation/Gait assistance: Min guard Ambulation Distance (Feet): 150 Feet Assistive device: 1 person hand held assist Gait Pattern/deviations: Staggering left;Staggering right     General Gait Details: mildly staggering gait pattern min hand held assist to simulate cane         Balance Overall balance assessment: Needs assistance Sitting-balance support: Feet supported;No upper extremity supported Sitting balance-Leahy Scale: Good     Standing balance support: No upper extremity supported;Single extremity supported Standing balance-Leahy Scale: Good                               Pertinent Vitals/Pain O2 sats 96% on RA.  HR stable with gait.     Home Living Family/patient expects to be discharged to:: Private residence Living Arrangements: Spouse/significant other Available Help at Discharge: Available 24 hours/day Type of Home: House Home Access: Stairs to enter   Entergy CorporationEntrance Stairs-Number of Steps: 1 small  step Home Layout: One level Home Equipment: Cane - single point      Prior Function Level of Independence: Independent with assistive device(s)         Comments: per pt report he uses the cane both inside and for community access.  He walks as his primary form of transportation (does not drive or take the bus_)  He does not work.         Extremity/Trunk Assessment   Upper Extremity Assessment: Overall WFL for tasks assessed           Lower Extremity Assessment: Overall WFL for tasks assessed (5/5 with palpable crepitus during testing of right knee)      Cervical / Trunk Assessment: Other exceptions  Communication   Communication: Prefers language other than English (Spanish)  Cognition Arousal/Alertness: Awake/alert Behavior During Therapy: WFL for tasks assessed/performed Overall Cognitive Status: Within Functional Limits for tasks assessed                               Assessment/Plan    PT Assessment Patent does not need any further PT services           PT Goals (Current goals can be found in the Care Plan section) Acute Rehab PT Goals PT Goal Formulation: No goals set, d/c therapy  End of Session Equipment Utilized During Treatment: Gait belt Activity Tolerance: Patient tolerated treatment well Patient left: in bed;with call bell/phone within reach;with family/visitor present Nurse Communication: Mobility status         Time: 1531-1541 PT Time Calculation (min): 10 min   Charges:   PT Evaluation $Initial PT Evaluation Tier I: 1 Procedure          Caitlynne Harbeck B. Asmi Fugere, PT, DPT [phone]   04/24/2014, 4:00 PM

## 2014-04-24 NOTE — Progress Notes (Signed)
The patient did not have any complaints overnight and did not receive any PRN medications.  He has been NPO since midnight.  His VS are stable.

## 2014-04-24 NOTE — Progress Notes (Signed)
SUBJECTIVE:  No complaints  OBJECTIVE:   Vitals:   Filed Vitals:   04/23/14 1500 04/23/14 2100 04/24/14 0454 04/24/14 0943  BP: 102/59 126/57 118/68 127/73  Pulse: 61 65 67   Temp: 97.9 F (36.6 C) 98 F (36.7 C) 98 F (36.7 C)   TempSrc: Oral Oral Oral   Resp: 18 18 18    Height:      Weight:   180 lb 1.9 oz (81.7 kg)   SpO2: 97% 99% 99%    I&O's:   Intake/Output Summary (Last 24 hours) at 04/24/14 0947 Last data filed at 04/24/14 0848  Gross per 24 hour  Intake    720 ml  Output    128 ml  Net    592 ml   TELEMETRY: Reviewed telemetry pt in NSR:     PHYSICAL EXAM General: Well developed, well nourished, in no acute distress Head: Eyes PERRLA, No xanthomas.   Normal cephalic and atramatic  Lungs:   Clear bilaterally to auscultation and percussion. Heart:   HRRR S1 S2 Pulses are 2+ & equal. Abdomen: Bowel sounds are positive, abdomen soft and non-tender without masses  Extremities:   No clubbing, cyanosis or edema.  DP +1 Neuro: Alert and oriented X 3. Psych:  Good affect, responds appropriately   LABS: Basic Metabolic Panel:  Recent Labs  40/98/1106/05/01 0755 04/24/14 0500  NA 142 141  K 4.3 3.7  CL 101 100  CO2 31 30  GLUCOSE 105* 118*  BUN 23 23  CREATININE 1.31 1.19  CALCIUM 9.2 9.3   Liver Function Tests: No results found for this basename: AST, ALT, ALKPHOS, BILITOT, PROT, ALBUMIN,  in the last 72 hours No results found for this basename: LIPASE, AMYLASE,  in the last 72 hours CBC: No results found for this basename: WBC, NEUTROABS, HGB, HCT, MCV, PLT,  in the last 72 hours Cardiac Enzymes: No results found for this basename: CKTOTAL, CKMB, CKMBINDEX, TROPONINI,  in the last 72 hours BNP: No components found with this basename: POCBNP,  D-Dimer: No results found for this basename: DDIMER,  in the last 72 hours Hemoglobin A1C: No results found for this basename: HGBA1C,  in the last 72 hours Fasting Lipid Panel: No results found for this  basename: CHOL, HDL, LDLCALC, TRIG, CHOLHDL, LDLDIRECT,  in the last 72 hours Thyroid Function Tests: No results found for this basename: TSH, T4TOTAL, FREET3, T3FREE, THYROIDAB,  in the last 72 hours Anemia Panel: No results found for this basename: VITAMINB12, FOLATE, FERRITIN, TIBC, IRON, RETICCTPCT,  in the last 72 hours Coag Panel:   No results found for this basename: INR, PROTIME    RADIOLOGY: Dg Chest Port 1 View  04/21/2014   CLINICAL DATA:  Shortness of breath, chest pain.  EXAM: PORTABLE CHEST - 1 VIEW  COMPARISON:  None.  FINDINGS: Cardiomegaly. No confluent airspace opacities or effusions. Mild interstitial prominence and Kerley B-lines in the lung bases concerning for early interstitial edema. No acute bony abnormality.  IMPRESSION: Cardiomegaly, suspect early interstitial edema.   Electronically Signed   By: Charlett NoseKevin  Dover M.D.   On: 04/21/2014 00:09   Dg Abd Portable 1v  04/23/2014   CLINICAL DATA:  Abdominal pain and distention.  EXAM: PORTABLE ABDOMEN - 1 VIEW  COMPARISON:  None.  FINDINGS: No evidence of dilated bowel loops. Moderate stool burden noted. Right upper quadrant surgical clips are seen from prior cholecystectomy.  IMPRESSION: No acute findings.   Electronically Signed   By: Myles RosenthalJohn  Stahl  M.D.   On: 04/23/2014 01:18    ASSESSMENT AND PLAN:  Principal Problem:  Acute respiratory failure - see below, otherwise, per IM  Active Problems:  Acute on chronic combined systolic and diastolic CHF (congestive heart failure) - now on PO Lasix today. Weight down 6 lbs from admit.  Family says they will get scales when he goes home, emphasized the need to weigh daily. Continue low dose BB/dig/low dose ACE I. Old records for PCP are vague but state that he has had LV dysfunction in the past but there is no note of ischemic w/u in the past.  Will get a Lexiscan myoview today to rule out ischemia. Essential hypertension - currently good control  IDDM (insulin dependent diabetes mellitus)  - per IM  Non-English speaking patient - Spanish-speaking, granddaughter helped translate today  DM type 2 with diabetic peripheral neuropathy - per IM  CKD (chronic kidney disease), stage II - watch w/ diuresis  Overweight - per IM    Quintella Reichert, MD  04/24/2014  9:47 AM

## 2014-04-24 NOTE — Progress Notes (Signed)
DC IV, DC Tele, DC Home. Discharge instructions and home medications discussed with patient and patient's wife using telephonic interpreting. No questions or concerns at this time. Patient leaving unit via wheelchair and appears in no acute distress.

## 2014-04-24 NOTE — Discharge Summary (Signed)
Physician Discharge Summary  Stanley Farmer ZOX:[id number] DOB: [birth year] DOA: 04/20/2014  PCP: Everlean CherryWHYTE,THOMAS M, MD  Admit date: 04/20/2014 Discharge date: 04/24/2014  Time spent: greater than 30 minutes  Recommendations for Outpatient Follow-up:  1. Monitor BMET 2. Home health arranged for CHF teaching and medication compliance  Discharge Diagnoses:  Principal Problem:   Acute respiratory failure Active Problems:   Acute on chronic combined systolic and diastolic CHF (congestive heart failure)   Essential hypertension   IDDM (insulin dependent diabetes mellitus)   Non-English speaking patient   DM type 2 with diabetic peripheral neuropathy   CKD (chronic kidney disease), stage II   Overweight   Congestive dilated cardiomyopathy   Discharge Condition: stable  Filed Weights   04/22/14 0523 04/23/14 0519 04/24/14 0454  Weight: 83.008 kg (183 lb) 81.103 kg (178 lb 12.8 oz) 81.7 kg (180 lb 1.9 oz)    History of present illness/Hospital Course:  73 year old non-English speaking Hispanic male presented to emergency room on 7/4 with dyspnea. He follows with a PCP in  but apparently has not seen a cardiologist in many years. In ED, hypoxic witn proBNP 1700 and CXR showing cardiomegaly and interstitial edema. Admitted to hospitalists, cardiology consulted.  Acute on chronic combined systolic and diastolic CHF (congestive heart failure): given IV lasix. Already on ACE inhibitor and beta blocker as outpatient.  Records obtained from PCP scanty.  Echocardiogram noted grade 2 diastolic dysfunction and an ejection fraction of 15%. Admission weight 186 lbs. At discharge, 180 lbs.  Nuclear stress test shows scar, but no ischemia. By discharge, breathing much improved.  Will be sent home on higher lasix dose per cardiology recommendations.  Procedures:  none  Consultations:  cardiology  Discharge Exam: Filed Vitals:   04/24/14 1554  BP:   Pulse: 84  Temp:   Resp:      General: alert, oriented Cardiovascular: RRR without MGR Respiratory: CTA without WRR Ext no CCE   Discharge Instructions   Activity as tolerated - No restrictions    Complete by:  As directed      Diet - low sodium heart healthy    Complete by:  As directed      Discharge instructions    Complete by:  As directed   FUROSEMIDE DOSE HAS CHANGED TO 80 MG TWICE DAILY. ALL OTHER MEDICATION CHANGES REMAIN THE SAME.            Medication List         atorvastatin 40 MG tablet  Commonly known as:  LIPITOR  Take 40 mg by mouth daily.     carvedilol 3.125 MG tablet  Commonly known as:  COREG  Take 3.125 mg by mouth 2 (two) times daily with a meal.     clopidogrel 75 MG tablet  Commonly known as:  PLAVIX  Take 75 mg by mouth daily.     digoxin 0.125 MG tablet  Commonly known as:  LANOXIN  Take 0.125 mg by mouth daily.     enalapril 2.5 MG tablet  Commonly known as:  VASOTEC  Take 2.5 mg by mouth daily.     furosemide 80 MG tablet  Commonly known as:  LASIX  Take 1 tablet (80 mg total) by mouth 2 (two) times daily. Take 2 tablets every morning and 1 tablet at lunch     gabapentin 300 MG capsule  Commonly known as:  NEURONTIN  Take 300 mg by mouth 3 (three) times daily.     insulin aspart 100  UNIT/ML injection  Commonly known as:  novoLOG  Inject 20-32 Units into the skin 3 (three) times daily with meals. Use 20 units every morning, use 28 units at lunch and 32 units every evening     insulin glargine 100 UNIT/ML injection  Commonly known as:  LANTUS  Inject 80 Units into the skin daily.     polyethylene glycol packet  Commonly known as:  MIRALAX / GLYCOLAX  Take 17 g by mouth 2 (two) times daily.       No Known Allergies     Follow-up Information   Follow up with [name], MD In 2 weeks.   Specialty:  Family Medicine   Contact information:   [address] [place] [address] [phone]       Follow up with Quintella Reichert,  MD. (HER OFFICE WILL CALL YOU TO SCHEDULE APPOINTMENT)    Specialty:  Cardiology   Contact information:   [address] [place] [address] [phone]        The results of significant diagnostics from this hospitalization (including imaging, microbiology, ancillary and laboratory) are listed below for reference.    Significant Diagnostic Studies: Nm Myocar Multi W/spect W/wall Motion / Ef  04/24/2014   HISTORY OF PRESENT ILLNESS: Nuclear Med Background  Indication for Stress Test:  New cardiomyopathy, assess for ischemia  History: Mr. Wingert is a 73 y.o.male with a reported but unclear history of CHF, HTN, DM admitted with SOB and leg edema, and abdominal swelling. No chest pain. Symptoms have progressed over the last several days. The patient is unsure of prior cardiac history, however his family states they believe he has been told his heart is weak. CXR cardiomegaly and mild interstitial edema, BNP 1756, K 4.8, Cr 1.2, trop neg, EKG with NSR and LBBB, unclear chronicity.  Echo LVEF 15%, grade II diastolic dysfunction  Cardiac Risk Factors:  HTN, DM2  Symptoms: Dyspnea  PROCEDURE: Nuclear Pre-Procedure  Caffeine/Decaff Intake:  NPO After: MN.  Lungs:  O2 Stat:  IV 0.9% NS with Angio Cath:  Chest Size (in):  Cup Size:  Height: 5'6"  Weight:  180 lbs  BMI:  Tech Comments:  Nuclear Med Study  1 or 2 day study: 1  Stress Test Type: Lexiscan  Reading MD: Hilty  Order Authorizing Provider: Turner  Resting Radionuclide: Sestamibi  Resting Radionuclide Dose:  10 mCi  Stress Radionuclide: Sestamibi  Stress Radionuclide Dose:  30 mCi  Stress Protocol  Rest HR: 76  Stress HR: 82  Rest BP: 115/77  Stress BP: 129/70  Exercise Time (min):  METS:  Dose of Adenosine (mg):  Dose of Lexiscan:  0.4 mg  Dose of Atropine (mg):  Dose of Dobutamine:  Stress Test Technologist:  Nuclear Technologist:  Rest Procedure:  Stress Procedure:  Transient Ischemic Dilatation (Normal <1.22): 0.99  Lung/Heart Ratio  (Normal <0.45):  0.37  QGS EDV:  398 ml  QGS ESV:  328 ml  LV Ejection Fraction: 17%  Rest ECG:  NSR with LBBB  Stress ECG:  LBBB with PVC's  Raw Data Images:  No motion artifact  Stress Images:  Fixed inferior, apical and septal defect  Rest Images:  Fixed inferior, apical and septal defect  Subtraction (SDS): 4  IMPRESSION: Exercise Capacity: N/A  BP Response: Normal  Clinical Symptoms: None  ECG Impression:  Occasional PVC's  Comparison with Prior Nuclear Study: None  Final Impression:  Markedly dilated LV. Severe global hypokinesis with  inferior akinesis/scar. Fixed inferior, apical and septal defects suggestive of scar. Anterior wall appears perfused. Mild RV uptake could suggest elevated LV filling pressure. LVEF of 17%.   Electronically Signed   By: Chrystie Nose   On: 04/24/2014 14:54   Dg Chest Port 1 View  04/21/2014   CLINICAL DATA:  Shortness of breath, chest pain.  EXAM: PORTABLE CHEST - 1 VIEW  COMPARISON:  None.  FINDINGS: Cardiomegaly. No confluent airspace opacities or effusions. Mild interstitial prominence and Kerley B-lines in the lung bases concerning for early interstitial edema. No acute bony abnormality.  IMPRESSION: Cardiomegaly, suspect early interstitial edema.   Electronically Signed   By: Charlett Nose M.D.   On: 04/21/2014 00:09   Dg Abd Portable 1v  04/23/2014   CLINICAL DATA:  Abdominal pain and distention.  EXAM: PORTABLE ABDOMEN - 1 VIEW  COMPARISON:  None.  FINDINGS: No evidence of dilated bowel loops. Moderate stool burden noted. Right upper quadrant surgical clips are seen from prior cholecystectomy.  IMPRESSION: No acute findings.   Electronically Signed   By: Myles Rosenthal M.D.   On: 04/23/2014 01:18   Echo Left ventricle: The cavity size was moderately dilated. Wall thickness was increased in a pattern of mild LVH. The estimated ejection fraction was 15%. Diffuse hypokinesis. Features are consistent with a pseudonormal left ventricular filling pattern, with  concomitant abnormal relaxation and increased filling pressure (grade 2 diastolic dysfunction). Doppler parameters are consistent with high ventricular filling pressure. - Aortic valve: There was trivial regurgitation. - Aortic root: The aortic root was mildly dilated. - Mitral valve: Calcified annulus. There was mild regurgitation. - Left atrium: The atrium was severely dilated. - Right ventricle: The cavity size was mildly dilated. - Right atrium: The atrium was mildly dilated. - Pericardium, extracardiac: A trivial pericardial effusion was identified.  Impressions:  - Severe global LV dysfunction; grade 2 diastolic dysfunction; severe LAE; mild RAE/RVE; mild MR; trace AI.  EKG Sinus tachycardia. LBBB  Microbiology: No results found for this or any previous visit (from the past 240 hour(s)).   Labs: Basic Metabolic Panel:  Recent Labs Lab 04/20/14 2350 04/22/14 0258 04/23/14 0755 04/24/14 0500  NA 142 143 142 141  K 4.8 3.6* 4.3 3.7  CL 102 102 101 100  CO2 28 29 31 30   GLUCOSE 225* 85 105* 118*  BUN 15 18 23 23   CREATININE 1.20 1.26 1.31 1.19  CALCIUM 8.8 8.8 9.2 9.3   Liver Function Tests:  Recent Labs Lab 04/20/14 2350  AST 37  ALT 34  ALKPHOS 67  BILITOT 0.6  PROT 7.0  ALBUMIN 3.8    Recent Labs Lab 04/20/14 2350  LIPASE 29   No results found for this basename: AMMONIA,  in the last 168 hours CBC:  Recent Labs Lab 04/20/14 2350  WBC 6.3  HGB 13.4  HCT 39.4  MCV 91.6  PLT 155   Cardiac Enzymes: No results found for this basename: CKTOTAL, CKMB, CKMBINDEX, TROPONINI,  in the last 168 hours BNP: BNP (last 3 results)  Recent Labs  04/20/14 2350  PROBNP 1756.0*   CBG:  Recent Labs Lab 04/23/14 1210 04/23/14 1558 04/23/14 2107 04/24/14 0643 04/24/14 1200  GLUCAP 141* 107* 221* 118* 150*       Signed:  Catherin Doorn L  [facility] 04/24/2014, 4:18 PM

## 2014-04-28 ENCOUNTER — Encounter (HOSPITAL_COMMUNITY): Payer: Self-pay | Admitting: Emergency Medicine

## 2014-04-28 ENCOUNTER — Emergency Department (HOSPITAL_COMMUNITY): Payer: Medicare Other

## 2014-04-28 ENCOUNTER — Emergency Department (HOSPITAL_COMMUNITY)
Admission: EM | Admit: 2014-04-28 | Discharge: 2014-04-28 | Disposition: A | Discharge status: Home / Self Care | Payer: Medicare Other | Attending: Emergency Medicine | Admitting: Emergency Medicine

## 2014-04-28 DIAGNOSIS — I5042 Chronic combined systolic (congestive) and diastolic (congestive) heart failure: Secondary | ICD-10-CM | POA: Insufficient documentation

## 2014-04-28 DIAGNOSIS — Z794 Long term (current) use of insulin: Secondary | ICD-10-CM | POA: Insufficient documentation

## 2014-04-28 DIAGNOSIS — K5900 Constipation, unspecified: Secondary | ICD-10-CM | POA: Insufficient documentation

## 2014-04-28 DIAGNOSIS — Z87891 Personal history of nicotine dependence: Secondary | ICD-10-CM | POA: Insufficient documentation

## 2014-04-28 DIAGNOSIS — Z7902 Long term (current) use of antithrombotics/antiplatelets: Secondary | ICD-10-CM | POA: Insufficient documentation

## 2014-04-28 DIAGNOSIS — Z79899 Other long term (current) drug therapy: Secondary | ICD-10-CM | POA: Insufficient documentation

## 2014-04-28 DIAGNOSIS — I10 Essential (primary) hypertension: Secondary | ICD-10-CM | POA: Insufficient documentation

## 2014-04-28 DIAGNOSIS — Z87442 Personal history of urinary calculi: Secondary | ICD-10-CM | POA: Insufficient documentation

## 2014-04-28 DIAGNOSIS — E119 Type 2 diabetes mellitus without complications: Secondary | ICD-10-CM | POA: Insufficient documentation

## 2014-04-28 LAB — CBC WITH DIFFERENTIAL/PLATELET
Basophils Absolute: 0 10*3/uL (ref 0.0–0.1)
Basophils Relative: 1 % (ref 0–1)
EOS ABS: 0.2 10*3/uL (ref 0.0–0.7)
EOS PCT: 3 % (ref 0–5)
HEMATOCRIT: 43.4 % (ref 39.0–52.0)
HEMOGLOBIN: 14.7 g/dL (ref 13.0–17.0)
LYMPHS PCT: 31 % (ref 12–46)
Lymphs Abs: 1.9 10*3/uL (ref 0.7–4.0)
MCH: 31.5 pg (ref 26.0–34.0)
MCHC: 33.9 g/dL (ref 30.0–36.0)
MCV: 92.9 fL (ref 78.0–100.0)
MONOS PCT: 7 % (ref 3–12)
Monocytes Absolute: 0.4 10*3/uL (ref 0.1–1.0)
Neutro Abs: 3.8 10*3/uL (ref 1.7–7.7)
Neutrophils Relative %: 60 % (ref 43–77)
Platelets: 190 10*3/uL (ref 150–400)
RBC: 4.67 MIL/uL (ref 4.22–5.81)
RDW: 12.5 % (ref 11.5–15.5)
WBC: 6.3 10*3/uL (ref 4.0–10.5)

## 2014-04-28 LAB — COMPREHENSIVE METABOLIC PANEL
ALK PHOS: 68 U/L (ref 39–117)
ALT: 40 U/L (ref 0–53)
ANION GAP: 13 (ref 5–15)
AST: 26 U/L (ref 0–37)
Albumin: 4.4 g/dL (ref 3.5–5.2)
BUN: 26 mg/dL — AB (ref 6–23)
CALCIUM: 9.8 mg/dL (ref 8.4–10.5)
CO2: 32 meq/L (ref 19–32)
Chloride: 97 mEq/L (ref 96–112)
Creatinine, Ser: 1.37 mg/dL — ABNORMAL HIGH (ref 0.50–1.35)
GFR calc non Af Amer: 50 mL/min — ABNORMAL LOW (ref >90)
GFR, EST AFRICAN AMERICAN: 57 mL/min — AB (ref >90)
Glucose, Bld: 74 mg/dL (ref 70–99)
Potassium: 4.7 mEq/L (ref 3.7–5.3)
Sodium: 142 mEq/L (ref 137–147)
TOTAL PROTEIN: 7.8 g/dL (ref 6.0–8.3)
Total Bilirubin: 0.7 mg/dL (ref 0.3–1.2)

## 2014-04-28 LAB — CBG MONITORING, ED: GLUCOSE-CAPILLARY: 77 mg/dL (ref 70–99)

## 2014-04-28 LAB — URINALYSIS, ROUTINE W REFLEX MICROSCOPIC
BILIRUBIN URINE: NEGATIVE
Glucose, UA: NEGATIVE mg/dL
Ketones, ur: NEGATIVE mg/dL
Leukocytes, UA: NEGATIVE
Nitrite: NEGATIVE
PROTEIN: NEGATIVE mg/dL
SPECIFIC GRAVITY, URINE: 1.01 (ref 1.005–1.030)
Urobilinogen, UA: 1 mg/dL (ref 0.0–1.0)
pH: 7.5 (ref 5.0–8.0)

## 2014-04-28 LAB — URINE MICROSCOPIC-ADD ON

## 2014-04-28 LAB — TROPONIN I: Troponin I: <0.3 ng/mL (ref <0.30)

## 2014-04-28 LAB — PRO B NATRIURETIC PEPTIDE: Pro B Natriuretic peptide (BNP): 3191 pg/mL — ABNORMAL HIGH (ref 0–125)

## 2014-04-28 MED ORDER — BISACODYL 5 MG PO TBEC: 5.0000 mg | DELAYED_RELEASE_TABLET | Freq: Every day | ORAL | Status: DC | PRN

## 2014-04-28 NOTE — ED Notes (Signed)
Pt's family st's pt st's he is starting to feel like he did when he was here on 04/20/14 for CHF.  Pt does not appear short of breath at this time.  Also st's pt went out to eat [employer] yesterday and ate to much.  Pt denies any pain.  St's his abd just feels swollen.  Last BM this am.

## 2014-04-28 NOTE — ED Notes (Signed)
Son stated, and translator , hes been having abdominal pain with weakness since this morning

## 2014-04-28 NOTE — Discharge Instructions (Signed)
Read the information below.  Use the prescribed medication as directed.  Please discuss all new medications with your pharmacist.  You may return to the Emergency Department at any time for worsening condition or any new symptoms that concern you.  If you develop chest pain, worsening shortness of breath, fever, you pass out, or become weak or dizzy, return to the ER for a recheck.      Lea la informacin a continuacin. Use el medicamento recetado como se indica. Por favor, discuta todos los nuevos medicamentos con su farmacutico. Usted puede volver a la sala de urgencias en cualquier momento por el empeoramiento de la condicin o nuevos sntomas que le preocupan . Si desarrolla dolor en el pecho , el empeoramiento de la falta de Stanley Farmer , Stanley Farmer , se pasa , o se convierte en dbil o mareado , regrese a Architect.

## 2014-04-28 NOTE — ED Provider Notes (Signed)
CSN: [id number]     Arrival date & time 04/28/14  1454 History   First MD Initiated Contact with Patient 04/28/14 1646     Chief Complaint  Patient presents with  . Abdominal Pain  . Bloated  . Weakness     (Consider location/radiation/quality/duration/timing/severity/associated sxs/prior Treatment) The history is provided by the patient and a relative. No language interpreter was used.    Patient with recent admission for acute respiratory failure with CHF, also with Dm, CKD, HTN, p/w worsening symptoms of abdominal bloating and SOB.  States that since this morning he has had worsening sensation of "inflation" of his right abdomen, which causes him to be SOB.  Also with dizziness, generalized weakness, and occasional nausea. Is having some constipation, hard small bowel movement followed by flatus at 1:00 today.   He ate [employer] yesterday and thinks he ate too much.  His symptoms are exacerbated after eating.  States he is starting to feel similar to how he felt last time before his admission but did not want to wait until it got as bad as before.  Denies DOE or any difficulty breathing while carrying out his normal ADLs.   Has an appointment with PCP on 7/16.  Denies fevers, chills, chest pain, abdominal pain, cough, vomiting, change in bowel habits (last BM 1:00 today, hard stool).  Does have burning with urination x 1 week.    Past Medical History  Diagnosis Date  . Hypertension   . Diabetes mellitus without complication   . CHF (congestive heart failure)    Past Surgical History  Procedure Laterality Date  . Kidney stone surgery     Family History  Problem Relation Age of Onset  . Diabetes Mother    History  Substance Use Topics  . Smoking status: Former Games developermoker  . Smokeless tobacco: Not on file  . Alcohol Use: No    Review of Systems  All other systems reviewed and are negative.     Allergies  Review of patient's allergies indicates no known allergies.  Home  Medications   Prior to Admission medications   Medication Sig Start Date End Date Taking? Authorizing Provider  alfuzosin (UROXATRAL) 10 MG 24 hr tablet Take 10 mg by mouth daily.   Yes Historical Provider, MD  atorvastatin (LIPITOR) 40 MG tablet Take 40 mg by mouth at bedtime.    Yes Historical Provider, MD  carvedilol (COREG) 3.125 MG tablet Take 3.125 mg by mouth 2 (two) times daily with a meal.   Yes Historical Provider, MD  clopidogrel (PLAVIX) 75 MG tablet Take 75 mg by mouth daily.   Yes Historical Provider, MD  digoxin (LANOXIN) 0.125 MG tablet Take 0.125 mg by mouth daily.   Yes Historical Provider, MD  enalapril (VASOTEC) 2.5 MG tablet Take 2.5 mg by mouth daily.   Yes Historical Provider, MD  furosemide (LASIX) 80 MG tablet Take 80-160 mg by mouth 2 (two) times daily. Take 160mg  by mouth in the morning and take 80mg  by mouth at lunch.   Yes Historical Provider, MD  gabapentin (NEURONTIN) 300 MG capsule Take 300 mg by mouth 3 (three) times daily.   Yes Historical Provider, MD  insulin aspart (NOVOLOG) 100 UNIT/ML injection Inject 20-32 Units into the skin 3 (three) times daily with meals. Use 20 units every morning, use 28 units at lunch and 32 units every evening   Yes Historical Provider, MD  insulin glargine (LANTUS) 100 UNIT/ML injection Inject 80 Units into the skin daily.  Yes Historical Provider, MD  polyethylene glycol (MIRALAX / GLYCOLAX) packet Take 17 g by mouth daily as needed for moderate constipation.    Yes Historical Provider, MD   BP 124/62  Pulse 62  Temp(Src) 98.2 F (36.8 C) (Oral)  Resp 17  SpO2 96% Physical Exam  Nursing note and vitals reviewed. Constitutional: He appears well-developed and well-nourished. No distress.  HENT:  Head: Normocephalic and atraumatic.  Neck: Neck supple.  Cardiovascular: Normal rate, regular rhythm and intact distal pulses.   Pulmonary/Chest: Effort normal and breath sounds normal. No respiratory distress. He has no wheezes.  He has no rales.  Abdominal: Soft. He exhibits no distension and no mass. There is no tenderness. There is no rebound and no guarding.  Musculoskeletal: He exhibits no edema and no tenderness.  Neurological: He is alert. He exhibits normal muscle tone.  Skin: He is not diaphoretic.  Psychiatric: He has a normal mood and affect. His behavior is normal.    ED Course  Procedures (including critical care time) Labs Review Labs Reviewed  COMPREHENSIVE METABOLIC PANEL - Abnormal; Notable for the following:    BUN 26 (*)    Creatinine, Ser 1.37 (*)    GFR calc non Af Amer 50 (*)    GFR calc Af Amer 57 (*)    All other components within normal limits  URINALYSIS, ROUTINE W REFLEX MICROSCOPIC - Abnormal; Notable for the following:    Hgb urine dipstick TRACE (*)    All other components within normal limits  PRO B NATRIURETIC PEPTIDE - Abnormal; Notable for the following:    Pro B Natriuretic peptide (BNP) 3191.0 (*)    All other components within normal limits  CBC WITH DIFFERENTIAL  URINE MICROSCOPIC-ADD ON  TROPONIN I  CBG MONITORING, ED    Imaging Review Dg Abd Acute W/chest  04/28/2014   CLINICAL DATA:  Abdominal bloating.  Shortness of breath.  EXAM: ACUTE ABDOMEN SERIES (ABDOMEN 2 VIEW & CHEST 1 VIEW)  COMPARISON:  Chest and abdominal radiographs 04/20/2014 and 04/22/2014, respectively  FINDINGS: Stable mild cardiomegaly. Normal mediastinal contours. Pulmonary vascularity appears borderline prominent. No interstitial edema or airspace disease is identified. Negative for pleural effusion or pneumothorax. Probable calcified granuloma at the right lung base, projecting over the right hemidiaphragm.  Fairly prominent amount of stool ascending colon, hepatic flexure, and splenic flexure. No evidence of bowel obstruction. No abdominal mass fact. No free intraperitoneal air. Cholecystectomy clips. Large osteophytes associated with the lumbar spine vertebral bodies. No acute osseous abnormality.   IMPRESSION: Prominent amount of colonic stool. Question the CC patient could be constipated. No evidence of bowel obstruction.  Cardiomegaly.  No acute findings identified in the chest.   Electronically Signed   By: Britta Mccreedy M.D.   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   MDM   Final diagnoses:  Chronic combined systolic and diastolic CHF (congestive heart failure)  Constipation, unspecified constipation type    Pt with hx CHF with recent admission for respiratory failure for same.  Pt reported eating a lot  of salty food yesterday, feeling abdominal bloating and mild SOB intermittently today.  No CP.  No DOE. No abdominal pain or tenderness.  Per family and patient he is not sick like he was last night - apparently on admission recently patient was having great difficulty breathing and they feel this is in no way similar.  Weight slightly increased from discharge.  BNP more elevated but CXR clear.  Renal function slightly worse than before.  Pt noted to be constipated on xray and family and pt feel this is the cause of his bloating.  Have added medication for constipation.  After discussion with Dr Rubin Payor, will defer any changes in Lasix to PCP whom pt will see this week (appt 7/16).  Advised patient to avoid salty foods/sodium - provided information regarding this.  Labs otherwise unremarkable.  Discussed result, findings, treatment, and follow up  with patient.  Pt given return precautions.  Pt verbalizes understanding and agrees with plan.        Trixie Dredge, PA-C 04/28/14 2214

## 2014-04-29 NOTE — ED Provider Notes (Signed)
Medical screening examination/treatment/procedure(s) were performed by non-physician practitioner and as supervising physician I was immediately available for consultation/collaboration.   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 History of CHF. Does not appear to be in distress right now. Has followup in 4 days. Will discharge.  Juliet Rude. Rubin Payor, MD 04/29/14 [phone]

## 2014-10-17 ENCOUNTER — Encounter (HOSPITAL_COMMUNITY): Payer: Self-pay | Admitting: Emergency Medicine

## 2014-10-17 ENCOUNTER — Emergency Department (HOSPITAL_COMMUNITY)
Admission: EM | Admit: 2014-10-17 | Discharge: 2014-10-17 | Disposition: A | Discharge status: Home / Self Care | Payer: Medicare Other | Attending: Emergency Medicine | Admitting: Emergency Medicine

## 2014-10-17 DIAGNOSIS — Z7902 Long term (current) use of antithrombotics/antiplatelets: Secondary | ICD-10-CM | POA: Insufficient documentation

## 2014-10-17 DIAGNOSIS — K5900 Constipation, unspecified: Secondary | ICD-10-CM | POA: Insufficient documentation

## 2014-10-17 DIAGNOSIS — Z79899 Other long term (current) drug therapy: Secondary | ICD-10-CM | POA: Diagnosis not present

## 2014-10-17 DIAGNOSIS — I10 Essential (primary) hypertension: Secondary | ICD-10-CM | POA: Insufficient documentation

## 2014-10-17 DIAGNOSIS — E119 Type 2 diabetes mellitus without complications: Secondary | ICD-10-CM | POA: Insufficient documentation

## 2014-10-17 DIAGNOSIS — I509 Heart failure, unspecified: Secondary | ICD-10-CM | POA: Insufficient documentation

## 2014-10-17 DIAGNOSIS — Z794 Long term (current) use of insulin: Secondary | ICD-10-CM | POA: Insufficient documentation

## 2014-10-17 DIAGNOSIS — Z87891 Personal history of nicotine dependence: Secondary | ICD-10-CM | POA: Insufficient documentation

## 2014-10-17 MED ORDER — POLYETHYLENE GLYCOL 3350 17 GM/SCOOP PO POWD: 0.5000 | Freq: Every day | ORAL | Status: DC | PRN

## 2014-10-17 MED ORDER — MAGNESIUM CITRATE PO SOLN
1.0000 | Freq: Once | ORAL | Status: AC
  Administered 2014-10-17: 1 via ORAL
  Filled 2014-10-17: qty 296

## 2014-10-17 NOTE — Discharge Instructions (Signed)
Estreñimiento °(Constipation) °Se llama constipación cuando: °· Elimina heces (defeca) menos de 3 veces por semana. °· Tiene dificultad para defecar. °· Las heces son secas y duras o son más grandes que lo normal. °CUIDADOS EN EL HOGAR  °· Consuma alimentos con alto contenido de fibra. Por ejemplo, frutas, vegetales, porotos y cereales integrales, como el arroz integral. °· Evite los alimentos ricos en grasas y azúcar. Estos incluyen patatas fritas, hamburguesas, galletas, dulces y refrescos. °· Si no consume suficientes alimentos ricos en fibras, tome productos que tengan agregado de fibra (suplementos). °· Beba suficiente líquido para mantener el pis (orina) claro o de color amarillo pálido. °· Haga ejercicio en forma regular, o como lo indique su médico. °· Vaya al baño cuando sienta la necesidad de defecar. No se aguante las ganas. °· Solo tome los medicamentos que le haya indicado su médico. No tome medicamentos que le ayuden a defecar (laxantes) sin antes consultarlo con su médico. °SOLICITE AYUDA DE INMEDIATO SI:  °· Observa sangre brillante en las heces (materia fecal). °· El estreñimiento dura más de 4 días o empeora. °· Tiene dolor en el vientre (abdominal) o el trasero (recto). °· Las heces son delgadas (como un lápiz). °· Pierde peso de manera inexplicable. °ASEGÚRESE DE QUE:  °· Comprende estas instrucciones. °· Controlará su afección. °· Recibirá ayuda de inmediato si no mejora o si empeora. °Document Released: 11/06/2010 Document Revised: 10/09/2013 °[employer]® Patient Information ©2015 [employer]. This information is not intended to replace advice given to you by your health care provider. Make sure you discuss any questions you have with your health care provider. ° °

## 2014-10-17 NOTE — ED Notes (Signed)
James, MD at bedside. 

## 2014-10-17 NOTE — ED Notes (Signed)
Per EMS, pt hasn't taken his polyethylene-glycol in two days, and has not had a bowel movement in two days. Pt denies pain, but reports fullness in his abdomen.

## 2014-10-17 NOTE — ED Provider Notes (Signed)
CSN: [id number]     Arrival date & time 10/17/14  0536 History   First MD Initiated Contact with Patient 10/17/14 0636     Chief Complaint  Patient presents with  . Constipation      HPI  Patient presents with his son. Has a history of constipation. Takes Maalox daily. Has not taken it for the last 2 days. This is likely was a bit of a miscommunication between he and his son this morning. Son did not realize that he had not taken it for the last few days and brought him here. Patient seems somewhat upset about being in the emergency room. States that he feels fine. He declines nausea vomiting or abdominal or rectal pain.  Past Medical History  Diagnosis Date  . Hypertension   . Diabetes mellitus without complication   . CHF (congestive heart failure)    Past Surgical History  Procedure Laterality Date  . Kidney stone surgery     Family History  Problem Relation Age of Onset  . Diabetes Mother    History  Substance Use Topics  . Smoking status: Former Games developermoker  . Smokeless tobacco: Not on file  . Alcohol Use: No    Review of Systems  Constitutional: Negative for fever, chills, diaphoresis, appetite change and fatigue.  HENT: Negative for mouth sores, sore throat and trouble swallowing.   Eyes: Negative for visual disturbance.  Respiratory: Negative for cough, chest tightness, shortness of breath and wheezing.   Cardiovascular: Negative for chest pain.  Gastrointestinal: Positive for constipation. Negative for nausea, vomiting, abdominal pain, diarrhea and abdominal distention.  Endocrine: Negative for polydipsia, polyphagia and polyuria.  Genitourinary: Negative for dysuria, frequency and hematuria.  Musculoskeletal: Negative for gait problem.  Skin: Negative for color change, pallor and rash.  Neurological: Negative for dizziness, syncope, light-headedness and headaches.  Hematological: Does not bruise/bleed easily.  Psychiatric/Behavioral: Negative for behavioral  problems and confusion.      Allergies  Review of patient's allergies indicates no known allergies.  Home Medications   Prior to Admission medications   Medication Sig Start Date End Date Taking? Authorizing Provider  alfuzosin (UROXATRAL) 10 MG 24 hr tablet Take 10 mg by mouth daily.    Historical Provider, MD  atorvastatin (LIPITOR) 40 MG tablet Take 40 mg by mouth at bedtime.     Historical Provider, MD  bisacodyl (DULCOLAX) 5 MG EC tablet Take 1 tablet (5 mg total) by mouth daily as needed for moderate constipation. 04/28/14   Trixie DredgeEmily West, PA-C  carvedilol (COREG) 3.125 MG tablet Take 3.125 mg by mouth 2 (two) times daily with a meal.    Historical Provider, MD  clopidogrel (PLAVIX) 75 MG tablet Take 75 mg by mouth daily.    Historical Provider, MD  digoxin (LANOXIN) 0.125 MG tablet Take 0.125 mg by mouth daily.    Historical Provider, MD  enalapril (VASOTEC) 2.5 MG tablet Take 2.5 mg by mouth daily.    Historical Provider, MD  furosemide (LASIX) 80 MG tablet Take 80-160 mg by mouth 2 (two) times daily. Take 160mg  by mouth in the morning and take 80mg  by mouth at lunch.    Historical Provider, MD  gabapentin (NEURONTIN) 300 MG capsule Take 300 mg by mouth 3 (three) times daily.    Historical Provider, MD  insulin aspart (NOVOLOG) 100 UNIT/ML injection Inject 20-32 Units into the skin 3 (three) times daily with meals. Use 20 units every morning, use 28 units at lunch and 32 units every evening  Historical Provider, MD  insulin glargine (LANTUS) 100 UNIT/ML injection Inject 80 Units into the skin daily.    Historical Provider, MD  polyethylene glycol powder (GLYCOLAX/MIRALAX) powder Take 127.5 g by mouth daily as needed for mild constipation. 10/17/14   Rolland Porter, MD   BP 156/77 mmHg  Pulse 90  Temp(Src) 98.1 F (36.7 C) (Oral)  Resp 20  SpO2 96% Physical Exam  Constitutional: He is oriented to person, place, and time. He appears well-developed and well-nourished. No distress.   HENT:  Head: Normocephalic.  Eyes: Conjunctivae are normal. Pupils are equal, round, and reactive to light. No scleral icterus.  Neck: Normal range of motion. Neck supple. No thyromegaly present.  Cardiovascular: Normal rate and regular rhythm.  Exam reveals no gallop and no friction rub.   No murmur heard. Pulmonary/Chest: Effort normal and breath sounds normal. No respiratory distress. He has no wheezes. He has no rales.  Abdominal: Soft. Bowel sounds are normal. He exhibits no distension. There is no tenderness. There is no rebound.  Soft benign abdomen. Normal active bowel sounds.  Musculoskeletal: Normal range of motion.  Neurological: He is alert and oriented to person, place, and time.  Skin: Skin is warm and dry. No rash noted.  Psychiatric: He has a normal mood and affect. His behavior is normal.    ED Course  Procedures (including critical care time) Labs Review Labs Reviewed - No data to display  Imaging Review No results found.   EKG Interpretation None      MDM   Final diagnoses:  Constipation, unspecified constipation type   Benign exam. Normal active bowel sounds. No pain. No rectal pressure. Declines rectal exam. Given magnesium citrate. Resume tomorrow.    Rolland Porter, MD 10/17/14 [phone]

## 2015-08-03 ENCOUNTER — Emergency Department (HOSPITAL_COMMUNITY)
Admission: EM | Admit: 2015-08-03 | Discharge: 2015-08-04 | Disposition: A | Discharge status: Home / Self Care | Payer: Medicare Other | Attending: Emergency Medicine | Admitting: Emergency Medicine

## 2015-08-03 ENCOUNTER — Emergency Department (HOSPITAL_COMMUNITY): Payer: Medicare Other

## 2015-08-03 ENCOUNTER — Encounter (HOSPITAL_COMMUNITY): Payer: Self-pay | Admitting: Emergency Medicine

## 2015-08-03 DIAGNOSIS — Z79899 Other long term (current) drug therapy: Secondary | ICD-10-CM | POA: Insufficient documentation

## 2015-08-03 DIAGNOSIS — I10 Essential (primary) hypertension: Secondary | ICD-10-CM | POA: Diagnosis not present

## 2015-08-03 DIAGNOSIS — Z794 Long term (current) use of insulin: Secondary | ICD-10-CM | POA: Diagnosis not present

## 2015-08-03 DIAGNOSIS — Z87891 Personal history of nicotine dependence: Secondary | ICD-10-CM | POA: Diagnosis not present

## 2015-08-03 DIAGNOSIS — E119 Type 2 diabetes mellitus without complications: Secondary | ICD-10-CM | POA: Insufficient documentation

## 2015-08-03 DIAGNOSIS — Z7902 Long term (current) use of antithrombotics/antiplatelets: Secondary | ICD-10-CM | POA: Diagnosis not present

## 2015-08-03 DIAGNOSIS — K219 Gastro-esophageal reflux disease without esophagitis: Secondary | ICD-10-CM | POA: Insufficient documentation

## 2015-08-03 DIAGNOSIS — I5023 Acute on chronic systolic (congestive) heart failure: Secondary | ICD-10-CM | POA: Insufficient documentation

## 2015-08-03 DIAGNOSIS — R1013 Epigastric pain: Secondary | ICD-10-CM | POA: Diagnosis present

## 2015-08-03 LAB — URINALYSIS, ROUTINE W REFLEX MICROSCOPIC
Bilirubin Urine: NEGATIVE
Glucose, UA: NEGATIVE mg/dL
Ketones, ur: NEGATIVE mg/dL
LEUKOCYTES UA: NEGATIVE
Nitrite: NEGATIVE
Protein, ur: 100 mg/dL — AB
SPECIFIC GRAVITY, URINE: 1.012 (ref 1.005–1.030)
UROBILINOGEN UA: 1 mg/dL (ref 0.0–1.0)
pH: 5.5 (ref 5.0–8.0)

## 2015-08-03 LAB — COMPREHENSIVE METABOLIC PANEL
ALBUMIN: 4 g/dL (ref 3.5–5.0)
ALT: 32 U/L (ref 17–63)
AST: 34 U/L (ref 15–41)
Alkaline Phosphatase: 62 U/L (ref 38–126)
Anion gap: 7 (ref 5–15)
BUN: 16 mg/dL (ref 6–20)
CO2: 30 mmol/L (ref 22–32)
Calcium: 9.1 mg/dL (ref 8.9–10.3)
Chloride: 99 mmol/L — ABNORMAL LOW (ref 101–111)
Creatinine, Ser: 1.18 mg/dL (ref 0.61–1.24)
GFR calc Af Amer: >60 mL/min (ref >60)
GFR calc non Af Amer: 59 mL/min — ABNORMAL LOW (ref >60)
GLUCOSE: 65 mg/dL (ref 65–99)
Potassium: 3.9 mmol/L (ref 3.5–5.1)
SODIUM: 136 mmol/L (ref 135–145)
TOTAL PROTEIN: 7.1 g/dL (ref 6.5–8.1)
Total Bilirubin: 1.3 mg/dL — ABNORMAL HIGH (ref 0.3–1.2)

## 2015-08-03 LAB — URINE MICROSCOPIC-ADD ON

## 2015-08-03 LAB — CBC
HEMATOCRIT: 41.5 % (ref 39.0–52.0)
Hemoglobin: 14 g/dL (ref 13.0–17.0)
MCH: 30.6 pg (ref 26.0–34.0)
MCHC: 33.7 g/dL (ref 30.0–36.0)
MCV: 90.8 fL (ref 78.0–100.0)
Platelets: 150 10*3/uL (ref 150–400)
RBC: 4.57 MIL/uL (ref 4.22–5.81)
RDW: 13 % (ref 11.5–15.5)
WBC: 5.3 10*3/uL (ref 4.0–10.5)

## 2015-08-03 LAB — LIPASE, BLOOD: Lipase: 24 U/L (ref 22–51)

## 2015-08-03 LAB — I-STAT TROPONIN, ED
TROPONIN I, POC: 0.01 ng/mL (ref 0.00–0.08)
Troponin i, poc: 0 ng/mL (ref 0.00–0.08)

## 2015-08-03 MED ORDER — IOHEXOL 300 MG/ML  SOLN
100.0000 mL | Freq: Once | INTRAMUSCULAR | Status: AC | PRN
  Administered 2015-08-03: 100 mL via INTRAVENOUS

## 2015-08-03 MED ORDER — IOHEXOL 300 MG/ML  SOLN
25.0000 mL | Freq: Once | INTRAMUSCULAR | Status: DC | PRN
  Administered 2015-08-03: 25 mL via ORAL
  Filled 2015-08-03: qty 30

## 2015-08-03 MED ORDER — FAMOTIDINE 20 MG PO TABS: 20.0000 mg | ORAL_TABLET | Freq: Two times a day (BID) | ORAL | Status: AC

## 2015-08-03 MED ORDER — FUROSEMIDE 10 MG/ML IJ SOLN: 40.0000 mg | Freq: Once | INTRAMUSCULAR | Status: DC

## 2015-08-03 MED ORDER — PANTOPRAZOLE SODIUM 40 MG IV SOLR
40.0000 mg | Freq: Once | INTRAVENOUS | Status: AC
  Administered 2015-08-03: 40 mg via INTRAVENOUS
  Filled 2015-08-03: qty 40

## 2015-08-03 NOTE — ED Notes (Signed)
Pt went to CT

## 2015-08-03 NOTE — ED Provider Notes (Signed)
CSN: [id number]     Arrival date & time 08/03/15  1837 History   First MD Initiated Contact with Patient 08/03/15 1953     Chief Complaint  Patient presents with  . Abdominal Pain  . Shortness of Breath     (Consider location/radiation/quality/duration/timing/severity/associated sxs/prior Treatment) HPI Comments: Patient with a history of hypertension diabetes and CHF presents with abdominal pain and distention. His family members are translating. Per them he states he's had discomfort in his abdomen for about 1-2 months. He's noticed that it's become more distended causing some shortness of breath. He relates the shortness of breath to the abdominal distention. His discomfort is in his upper abdomen/epigastric area. He has no associated nausea or vomiting. He's having normal bowel movements. No dysuria. He does feel like he has a difficult time swallowing solids. He feels like things get stuck in his throat and he has to drink some water to move it down. He denies any known fevers. His primary care physician is in [place]. He has not seen a gastroenterologist. He denies any chest pain. He denies any exertional symptoms. He denies any change in his baseline leg swelling.  Patient is a 74 y.o. male presenting with abdominal pain and shortness of breath.  Abdominal Pain Associated symptoms: shortness of breath   Associated symptoms: no chest pain, no chills, no cough, no diarrhea, no fatigue, no fever, no hematuria, no nausea and no vomiting   Shortness of Breath Associated symptoms: abdominal pain   Associated symptoms: no chest pain, no cough, no diaphoresis, no fever, no headaches, no rash and no vomiting     Past Medical History  Diagnosis Date  . Hypertension   . Diabetes mellitus without complication (HCC)   . CHF (congestive heart failure) [facility])    Past Surgical History  Procedure Laterality Date  . Kidney stone surgery     Family History  Problem Relation Age of Onset  .  Diabetes Mother    Social History  Substance Use Topics  . Smoking status: Former Games developer  . Smokeless tobacco: None  . Alcohol Use: No    Review of Systems  Constitutional: Negative for fever, chills, diaphoresis and fatigue.  HENT: Negative for congestion, rhinorrhea and sneezing.   Eyes: Negative.   Respiratory: Positive for shortness of breath. Negative for cough and chest tightness.   Cardiovascular: Negative for chest pain and leg swelling.  Gastrointestinal: Positive for abdominal pain and abdominal distention. Negative for nausea, vomiting, diarrhea and blood in stool.  Genitourinary: Negative for frequency, hematuria, flank pain and difficulty urinating.  Musculoskeletal: Negative for back pain and arthralgias.  Skin: Negative for rash.  Neurological: Negative for dizziness, speech difficulty, weakness, numbness and headaches.      Allergies  Review of patient's allergies indicates no known allergies.  Home Medications   Prior to Admission medications   Medication Sig Start Date End Date Taking? Authorizing Provider  alfuzosin (UROXATRAL) 10 MG 24 hr tablet Take 10 mg by mouth daily.   Yes Historical Provider, MD  atorvastatin (LIPITOR) 40 MG tablet Take 40 mg by mouth at bedtime.    Yes Historical Provider, MD  bisacodyl (DULCOLAX) 5 MG EC tablet Take 1 tablet (5 mg total) by mouth daily as needed for moderate constipation. Patient taking differently: Take 5 mg by mouth 2 (two) times daily.  04/28/14  Yes Trixie Dredge, PA-C  carvedilol (COREG) 3.125 MG tablet Take 3.125 mg by mouth 2 (two) times daily with a meal.  Yes Historical Provider, MD  clopidogrel (PLAVIX) 75 MG tablet Take 75 mg by mouth daily.   Yes Historical Provider, MD  enalapril (VASOTEC) 2.5 MG tablet Take 2.5 mg by mouth daily.   Yes Historical Provider, MD  furosemide (LASIX) 80 MG tablet Take 80-160 mg by mouth 2 (two) times daily. Take  by mouth in the morning and take  by mouth at lunch.    Yes Historical Provider, MD  gabapentin (NEURONTIN) 300 MG capsule Take 300 mg by mouth 3 (three) times daily.   Yes Historical Provider, MD  insulin aspart (NOVOLOG) 100 UNIT/ML injection Inject 20-32 Units into the skin 3 (three) times daily with meals. Use 20 units every morning, use 28-32 units at lunch and 28-32 units every evening   Yes Historical Provider, MD  insulin glargine (LANTUS) 100 UNIT/ML injection Inject 80 Units into the skin every morning.    Yes Historical Provider, MD  metoCLOPramide (REGLAN) 5 MG tablet Take 5 mg by mouth 4 (four) times daily -  before meals and at bedtime.   Yes Historical Provider, MD  polyethylene glycol powder (GLYCOLAX/MIRALAX) powder Take 127.5 g by mouth daily as needed for mild constipation. 10/17/14   Rolland Porter, MD   BP 117/72 mmHg  Pulse 80  Temp(Src) 98.6 F (37 C) (Oral)  Resp 18  Wt 192 lb 5 oz (87.232 kg)  SpO2 97% Physical Exam  Constitutional: He is oriented to person, place, and time. He appears well-developed and well-nourished.  HENT:  Head: Normocephalic and atraumatic.  Eyes: Pupils are equal, round, and reactive to light.  Neck: Normal range of motion. Neck supple.  Cardiovascular: Normal rate, regular rhythm and normal heart sounds.   Pulmonary/Chest: Effort normal and breath sounds normal. No respiratory distress. He has no wheezes. He has no rales. He exhibits no tenderness.  Abdominal: Soft. Bowel sounds are normal. He exhibits distension. There is tenderness (mild TTP epigastrium). There is no rebound and no guarding.  Musculoskeletal: Normal range of motion. He exhibits edema.  Lymphadenopathy:    He has no cervical adenopathy.  Neurological: He is alert and oriented to person, place, and time.  Skin: Skin is warm and dry. No rash noted.  Psychiatric: He has a normal mood and affect.    ED Course  Procedures (including critical care time) Labs Review Results for orders placed or performed during the hospital  encounter of 08/03/15  Lipase, blood  Result Value Ref Range   Lipase 24 22 - 51 U/L  Comprehensive metabolic panel  Result Value Ref Range   Sodium 136 135 - 145 mmol/L   Potassium 3.9 3.5 - 5.1 mmol/L   Chloride 99 (L) 101 - 111 mmol/L   CO2 30 22 - 32 mmol/L   Glucose, Bld 65 65 - 99 mg/dL   BUN 16 6 - 20 mg/dL   Creatinine, Ser 1.61 0.61 - 1.24 mg/dL   Calcium 9.1 8.9 - 09.6 mg/dL   Total Protein 7.1 6.5 - 8.1 g/dL   Albumin 4.0 3.5 - 5.0 g/dL   AST 34 15 - 41 U/L   ALT 32 17 - 63 U/L   Alkaline Phosphatase 62 38 - 126 U/L   Total Bilirubin 1.3 (H) 0.3 - 1.2 mg/dL   GFR calc non Af Amer 59 (L) >60 mL/min   GFR calc Af Amer >60 >60 mL/min   Anion gap 7 5 - 15  CBC  Result Value Ref Range   WBC 5.3 4.0 - 10.5  K/uL   RBC 4.57 4.22 - 5.81 MIL/uL   Hemoglobin 14.0 13.0 - 17.0 g/dL   HCT 40.9 81.1 - 91.4 %   MCV 90.8 78.0 - 100.0 fL   MCH 30.6 26.0 - 34.0 pg   MCHC 33.7 30.0 - 36.0 g/dL   RDW 78.2 95.6 - 21.3 %   Platelets 150 150 - 400 K/uL  Urinalysis, Routine w reflex microscopic (not at [facility])  Result Value Ref Range   Color, Urine YELLOW YELLOW   APPearance CLOUDY (A) CLEAR   Specific Gravity, Urine 1.012 1.005 - 1.030   pH 5.5 5.0 - 8.0   Glucose, UA NEGATIVE NEGATIVE mg/dL   Hgb urine dipstick SMALL (A) NEGATIVE   Bilirubin Urine NEGATIVE NEGATIVE   Ketones, ur NEGATIVE NEGATIVE mg/dL   Protein, ur 086 (A) NEGATIVE mg/dL   Urobilinogen, UA 1.0 0.0 - 1.0 mg/dL   Nitrite NEGATIVE NEGATIVE   Leukocytes, UA NEGATIVE NEGATIVE  Urine microscopic-add on  Result Value Ref Range   Squamous Epithelial / LPF RARE RARE   RBC / HPF 3-6 <3 RBC/hpf   Bacteria, UA RARE RARE  I-stat troponin, ED  Result Value Ref Range   Troponin i, poc 0.00 0.00 - 0.08 ng/mL   Comment 3           Dg Chest 2 View  08/03/2015  CLINICAL DATA:  Short of breath. Upper abdominal pain and nausea. No chest pain. History of hypertension and diabetes. EXAM: CHEST  2 VIEW COMPARISON:  04/28/2014  FINDINGS: Mild enlargement of the cardiopericardial silhouette. No mediastinal or hilar masses or evidence of adenopathy. Mild interstitial thickening is noted in the lung bases with thickening noted along the fissures. No lung consolidation. No pleural effusion or pneumothorax. Skeletal structures are demineralized but grossly intact. IMPRESSION: 1. Cardiomegaly with mild lower lung zone interstitial thickening. Mild congestive heart failure suspected given the history of shortness of breath. No evidence of pneumonia. Electronically Signed   By: Amie Portland M.D.   On: 08/03/2015 20:30   Ct Abdomen Pelvis W Contrast  08/03/2015  CLINICAL DATA:  Acute onset of lower abdominal pain and abdominal distention. Initial encounter. EXAM: CT ABDOMEN AND PELVIS WITH CONTRAST TECHNIQUE: Multidetector CT imaging of the abdomen and pelvis was performed using the standard protocol following bolus administration of intravenous contrast. CONTRAST:  OMNIPAQUE IOHEXOL 300 MG/ML  SOLN COMPARISON:  Abdominal radiograph performed 04/28/2014 FINDINGS: Trace bilateral pleural effusions are seen. Scattered coronary artery calcifications are seen. Calcification is noted at the mitral and aortic valves. A trace pericardial effusion is noted. The liver and spleen are unremarkable in appearance. The patient is status post cholecystectomy, with clips noted along the gallbladder fossa. The pancreas and adrenal glands are unremarkable. The kidneys are unremarkable in appearance. There is no evidence of hydronephrosis. No renal or ureteral stones are seen. Mild nonspecific perinephric stranding is noted bilaterally. No free fluid is identified. The small bowel is unremarkable in appearance. The stomach is within normal limits. No acute vascular abnormalities are seen. Mild scattered calcification noted along the abdominal aorta and its branches. The appendix is normal in caliber, without evidence for appendicitis. The colon is  partially filled with stool and is unremarkable in appearance. The bladder is moderately distended and grossly unremarkable. The prostate is enlarged, measuring 5.4 cm in transverse dimension. No inguinal lymphadenopathy is seen. No acute osseous abnormalities are identified. Anterior osteophytes are noted along the lower thoracic and lumbar spine. IMPRESSION: 1. No acute abnormality  seen to explain the patient's symptoms. 2. Trace bilateral pleural effusions noted. 3. Scattered coronary artery calcifications seen. Calcification at the mitral and aortic valves. 4. Trace pericardial effusion noted. 5. Mild scattered calcification along the abdominal aorta and its branches. 6. Enlarged prostate noted. Electronically Signed   By: Roanna Raider M.D.   On: 08/03/2015 22:46      Imaging Review Dg Chest 2 View  08/03/2015  CLINICAL DATA:  Short of breath. Upper abdominal pain and nausea. No chest pain. History of hypertension and diabetes. EXAM: CHEST  2 VIEW COMPARISON:  04/28/2014 FINDINGS: Mild enlargement of the cardiopericardial silhouette. No mediastinal or hilar masses or evidence of adenopathy. Mild interstitial thickening is noted in the lung bases with thickening noted along the fissures. No lung consolidation. No pleural effusion or pneumothorax. Skeletal structures are demineralized but grossly intact. IMPRESSION: 1. Cardiomegaly with mild lower lung zone interstitial thickening. Mild congestive heart failure suspected given the history of shortness of breath. No evidence of pneumonia. Electronically Signed   By: Amie Portland M.D.   On: 08/03/2015 20:30   Ct Abdomen Pelvis W Contrast  08/03/2015  CLINICAL DATA:  Acute onset of lower abdominal pain and abdominal distention. Initial encounter. EXAM: CT ABDOMEN AND PELVIS WITH CONTRAST TECHNIQUE: Multidetector CT imaging of the abdomen and pelvis was performed using the standard protocol following bolus administration of intravenous contrast.  CONTRAST:  OMNIPAQUE IOHEXOL 300 MG/ML  SOLN COMPARISON:  Abdominal radiograph performed 04/28/2014 FINDINGS: Trace bilateral pleural effusions are seen. Scattered coronary artery calcifications are seen. Calcification is noted at the mitral and aortic valves. A trace pericardial effusion is noted. The liver and spleen are unremarkable in appearance. The patient is status post cholecystectomy, with clips noted along the gallbladder fossa. The pancreas and adrenal glands are unremarkable. The kidneys are unremarkable in appearance. There is no evidence of hydronephrosis. No renal or ureteral stones are seen. Mild nonspecific perinephric stranding is noted bilaterally. No free fluid is identified. The small bowel is unremarkable in appearance. The stomach is within normal limits. No acute vascular abnormalities are seen. Mild scattered calcification noted along the abdominal aorta and its branches. The appendix is normal in caliber, without evidence for appendicitis. The colon is partially filled with stool and is unremarkable in appearance. The bladder is moderately distended and grossly unremarkable. The prostate is enlarged, measuring 5.4 cm in transverse dimension. No inguinal lymphadenopathy is seen. No acute osseous abnormalities are identified. Anterior osteophytes are noted along the lower thoracic and lumbar spine. IMPRESSION: 1. No acute abnormality seen to explain the patient's symptoms. 2. Trace bilateral pleural effusions noted. 3. Scattered coronary artery calcifications seen. Calcification at the mitral and aortic valves. 4. Trace pericardial effusion noted. 5. Mild scattered calcification along the abdominal aorta and its branches. 6. Enlarged prostate noted. Electronically Signed   By: Roanna Raider M.D.   On: 08/03/2015 22:46   I have personally reviewed and evaluated these images and lab results as part of my medical decision-making.   EKG Interpretation   Date/Time:  Sunday August 03 2015 19:06:11 EDT Ventricular Rate:  75 PR Interval:  196 QRS Duration: 194 QT Interval:  468 QTC Calculation: 522 R Axis:   -52 Text Interpretation:  Normal sinus rhythm Left axis deviation Left bundle  branch block Abnormal ECG no change from previous Confirmed by Donnald Garre,  MD, Lebron Conners [phone]) on 08/03/2015 7:07:31 PM      MDM   Final diagnoses:  Acute on chronic systolic  congestive heart failure (HCC)  Gastroesophageal reflux disease, esophagitis presence not specified    Patient presents with abdominal discomfort and distention. He also reports some shortness of breath which she associates with the abdominal distention. His chest x-ray shows some mild fluid overload. His oxygen saturation saturations are normal and he doesn't appear to have any increased work of breathing.  His CT scan is unremarkable with no ascites, obstruction or other abnormality. There is no evidence of pancreatitis. I feel like a lot of the symptoms may be related to reflux and he may have an esophageal stricture. I feel that he probably needs GI follow-up at some point. He doesn't have any problems swallowing liquids site don't think that he needs an emergent follow-up. He doesn't have any signs of esophageal fluid bolus. However given his mild fluid overload on chest x-ray and reports of shortness of breath, I will give him a dose of Lasix. His BMP is pending as well as repeat troponin. He doesn't have any EKG changes to suggest acute ischemia. I feel like if his symptoms are improved, he can be discharged home with follow-up with his PCP and probable GI.  Dr. Patria Mane to take over care and recheck pt.    Rolan Bucco, MD 08/03/15 [phone]

## 2015-08-03 NOTE — ED Notes (Signed)
While ambulating pt, pt was able to ambulate with problems. Pt O2 stayed at 98% and pulse stayed in the 70s the whole time. Pt had no complaints while ambulating.

## 2015-08-03 NOTE — Discharge Instructions (Signed)
Insuficiencia cardaca (Heart Failure) La insuficiencia cardaca es una afeccin en la que el corazn tiene dificultad para bombear la [place]. El corazn no bombea sangre de [place] eficiente para que el organismo pueda funcionar bien. En algunos casos de insuficiencia cardaca, los lquidos vuelven a los pulmones, o puede haber hinchazn (edema) en la zona inferior de las piernas. La insuficiencia cardaca por lo general es una enfermedad de larga duracin (crnica). Es importante que se cuide mucho y que siga el plan de tratamiento que le indique su mdico. CAUSAS  Algunas enfermedades y condiciones pueden causar insuficiencia cardaca. Estas incluyen lo siguiente:  Presin arterial elevada (hipertensin). La hipertensin hace que el msculo cardaco trabaje ms de lo normal. Cuando la presin en los vasos sanguneos es alta, el corazn tiene que bombear (contraerse) con ms fuerza para Insurance account manager la sangre por todo el cuerpo. La hipertensin arterial hace que con el tiempo el corazn se vuelva rgido y dbil.  Enfermedad arterial coronaria (EAC). La enfermedad arterial coronaria es la acumulacin de colesterol y grasa (placa) en las arterias del corazn. La obstruccin de las arterias priva al msculo cardaco de sangre y oxgeno. Esto ocasiona dolor en el pecho y puede conducir a un infarto. La hipertensin arterial tambin favorece la enfermedad arterial coronaria.  Ataque cardaco (infarto de miocardio). El ataque cardaco se produce cuando se obstruye una o ms arterias del corazn. La prdida de oxgeno daa el tejido muscular cardaco. Cuando esto ocurre, una parte del msculo cardaco muere. El tejido daado no se contrae bien y Scientist, research (life sciences) la capacidad del corazn para Insurance account manager.  Vlvulas cardacas anormales. Cuando las vlvulas cardacas no se abren y cierran como corresponde, pueden causar insuficiencia cardaca. Esto hace que el msculo cardaco tenga que bombear con ms intensidad  para que la [place].  Enfermedad del msculo cardaco (miocardiopata o miocarditis). En esta enfermedad, el msculo cardaco est daado por diversas causas. Entre ellas se encuentran el consumo de alcohol o drogas, las infecciones o pueden ser causas desconocidas. Todas estas causas aumentan el riesgo de insuficiencia cardaca.  Enfermedad pulmonar. Enfermedad pulmonar que hace que el corazn se esfuerce ms porque los pulmones no funcionan correctamente. Esto puede hacer que el corazn se tensione, y causar insuficiencia.  Diabetes. La diabetes aumenta el riesgo de insuficiencia cardaca. El nivel elevado de azcar en la sangre contribuye a aumentar los [place] de grasas (lpidos) en la [place]. La diabetes tambin favorece que lentamente se daen los pequeos vasos sanguneos que transportan nutrientes importantes al el msculo cardaco. Cuando el corazn no obtiene oxgeno y alimento suficiente, se debilita y se torna rgido. Por lo tanto no se contrae de Firefighter.  Hay otras enfermedades y condiciones que pueden contribuir a la insuficiencia cardaca. Entre ellas se incluyen el ritmo cardaco anormal, los problemas de tiroides y los recuentos bajos de glbulos rojos (anemia). Determinadas conductas perjudiciales aumentan el riesgo de insuficiencia cardaca, por ejemplo:  Tener sobrepeso.  Fumar o mascar tabaco.  Consumir alimentos ricos en grasas y colesterol.  Abusar de las drogas ilcitas o del alcohol.  La falta de actividad fsica. SNTOMAS  Los sntomas pueden variar y ser difciles de Engineer, manufacturing. Los sntomas pueden incluir:  Falta de aire al realizar actividades como subir escaleras.  Tos persistente.  Hinchazn de los pies, tobillos, piernas o abdomen.  Aumento de peso sin motivo.  Dificultad para respirar al estar acostado (ortopnea).  Despertarse con la necesidad de sentarse y tomar aire.  Latidos cardacos rpidos.  Fatiga y prdida de  Engineer, drilling.  Mareos, o sensacin de desmayo o desvanecimiento.  Prdida del apetito.  Nuseas.  Orina con ms frecuencia durante la noche (nocturia). DIAGNSTICO  El diagnstico se realiza segn la historia clnica, los sntomas, el examen fsico y las pruebas diagnsticas. Las pruebas diagnsticas son:  Science writer.  Electrocardiograma.  Radiografa de trax.  Anlisis de [place].  Prueba de esfuerzo.  Angiografa cardaca.  Gammagrafa. TRATAMIENTO  El tratamiento est destinado al control de los sntomas. Podr ser necesario que tome medicamentos, realice cambios en su estilo de vida o se someta a una intervencin [place] para tratar la insuficiencia cardaca.  Los medicamentos para el tratamiento de la insuficiencia cardaca son:  Inhibidores de la enzima convertidora de angiotensina (IECA). Este tipo de Automatic Data bloquea los efectos de una protena llamada enzima convertidora de angiotensina. Los inhibidores de la ECA Banker (dilatan) los vasos sanguneos y ayudan a Technical sales engineer la presin arterial.  Bloqueadores de los receptores de angiotensina [place]). Este tipo de medicamento bloquea las acciones de una protena de la sangre llamada angiotensina. Los bloqueadores de los receptores de angiotensina dilatan los vasos sanguneos y [place] a reducir la presin arterial.  Medicamentos para eliminar los lquidos (diurticos). Los diurticos [employer] los riones eliminen la sal y el agua de la [place]. El lquido en exceso es eliminado con la [place]. Esta eliminacin del exceso de lquido disminuye el volumen de sangre que el corazn bombea.  Betabloqueantes. Los betabloqueantes evitan que el corazn palpite demasiado rpido y mejoran la fuerza del msculo cardaco.  Digitlicos. Aumentan la fuerza de los latidos cardacos.  Los [employer] un estilo de vida saludable incluyen:  Barista y Pharmacologist un peso saludable.  Dejar de fumar o mascar tabaco.  Consumir alimentos  cardiosaludables.  Limitar o evitar el alcohol.  Dejar de consumir drogas ilcitas.  Hacer actividad fsica segn las indicaciones de su mdico.  Los tratamientos quirrgicos para la insuficiencia cardaca pueden ser:  Un procedimiento para abrir las arterias obstruidas, reparacin de vlvulas cardacas daadas o la extirpacin del tejido muscular cardaco daado.  La colocacin de un marcapasos para ayudar al funcionamiento del msculo cardaco y para controlar ciertos ritmos cardacos anormales.  Un desfibrilador cardioversor interno para tratar determinados ritmos cardacos graves anormales.  Un dispositivo de asistencia ventricular izquierda (DAVI) para ayudar a que el corazn [employer]. INSTRUCCIONES PARA EL CUIDADO EN EL HOGAR   Tome los medicamentos solamente como se lo haya indicado el mdico. Los medicamentos son importantes para reducir la carga de trabajo del corazn, disminuir la progresin de la insuficiencia cardaca y Scientist, clinical (histocompatibility and immunogenetics) los sntomas.  No deje de tomar los medicamentos, excepto si se lo indica su mdico.  No se saltee ninguna dosis de los medicamentos.  Pida una nueva receta antes de quedarse sin medicamentos. Es necesario que tome sus medicamentos todos [place].  Haga actividad fsica moderada si se lo indica su mdico. La actividad fsica moderada puede beneficiar a International aid/development worker. Los ancianos y las personas con insuficiencia cardaca grave deben consultarle a su mdico qu actividades fsicas son recomendables para ellos.  Consuma alimentos cardiosaludables. Los alimentos no deben incluir grasas trans y deben tener bajo contenido de grasas saturadas, colesterol y sal (sodio). Las opciones saludables incluyen frutas frescas o congeladas y verduras, pescado, carnes magras, legumbres, productos lcteos descremados o bajos en grasa y cereales integrales o alimentos con alto contenido de [place]. Hable con un nutricionista para aprender ms sobre los alimentos  cardiosaludables.  Limite el  sodio si se lo indica su mdico. La restriccin de sodio puede reducir los sntomas de insuficiencia cardaca en [employer]. Hable con un nutricionista para aprender ms sobre los condimentos cardiosaludables.  Use mtodos de coccin saludables. Los mtodos de coccin saludables incluyen asar, Software engineer, hervir, Development worker, community, cocer al vapor o Actor. Hable con un nutricionista para aprender ms acerca de los mtodos de coccin saludables.  Limite los lquidos si se lo indica su mdico. La restriccin de lquidos puede reducir los sntomas de insuficiencia cardaca en [employer].  Controle su peso a diario. Es importante que se pese todos los [address] para reconocer anticipadamente cuando hay exceso de lquido. Hgalo todas las maanas despus de orinar y antes de Engineer, maintenance. Use la misma ropa cada vez que se pese. Anote su [employer]. Lleve a su mdico el registro de [address].  Controle y registre su presin arterial si se lo indica su mdico.  Contrlese el pulso si se lo indica su mdico.  Pierda peso si se lo indica su mdico. La prdida de peso puede reducir los sntomas de insuficiencia cardaca en International aid/development worker.  Deje de fumar o mascar tabaco. La nicotina hace que el corazn trabaje ms y [employer] vasos sanguneos. No utilice chicles ni parches de nicotina antes de hablar con su mdico.  Concurra a todas las visitas de control como se lo haya indicado el mdico. Esto es importante.  Limite el consumo de alcohol a no ms de 1 medida por da en las mujeres no embarazadas y 2 medidas en los hombres. Una medida equivale a 12onzas de cerveza, 5onzas de vino o 1onzas de bebidas alcohlicas de alta graduacin. Beber ms puede ser daino para el corazn. Informe a su mdico si bebe alcohol varias veces a la semana. Hable con su mdico acerca de si el alcohol es seguro para usted. Si el corazn ya ha sufrido un dao por el alcohol o sufre una  insuficiencia cardaca grave, debe dejar de consumirlo completamente.  Deje de consumir drogas ilcitas.  Mantngase al da con las vacunas. Esto es especialmente importante prevenir las infecciones respiratorias con vacunas actualizadas contra el neumococo y la gripe.  Controle otros problemas de [place], como hipertensin, diabetes, enfermedad de la tiroides o ritmos cardacos anormales segn las indicaciones de su mdico.  Aprenda a Dealer.  Planifique perodos de descanso cuando se sienta fatigado.  Aprenda estrategias para manejar las altas temperaturas. Si el clima es extremadamente caluroso:  Evite la actividad fsica intensa.  Use el aire acondicionado o los ventiladores o busque un lugar ms fresco.  Evite la cafena y el alcohol.  Use ropa holgada, ligera y de colores claros.  Aprenda estrategias para manejar el fro. Si el clima es extremadamente fro:  Evite la actividad fsica intensa.  Vstase con varias capas de ropa.  Use mitones o guantes, un sombrero y [place] bufanda cuando salga.  Evite el alcohol.  Reciba asesoramiento y apoyo si lo necesita.  Busque programas de rehabilitacin y participe en ellos para mantener o mejorar su independencia y su calidad de vida. SOLICITE ATENCIN MDICA SI:   Aumenta de peso rpidamente.  Nota que le falta el aire y esto no es habitual en usted.  No puede participar en sus actividades fsicas habituales.  Se cansa con facilidad.  Tose ms de lo normal, especialmente al realizar actividad fsica.  Observa que sus manos, pies, tobillos o abdomen se le hinchan o estn ms hinchados que lo habitual.  Le cuesta dormir debido a que Engineer, manufacturing systems.  Siente que el corazn late rpido (palpitaciones).  Siente mareos o sensacin de desvanecimiento al ponerse de pie. SOLICITE ATENCIN MDICA DE INMEDIATO SI:   Tiene dificultad para respirar.  Hay una modificacin en el estado mental, como disminucin  de la lucidez o dificultad para concentrarse.  Siente dolor o International aid/development worker.  Se desmaya una vez (sncope). ASEGRESE DE QUE:   Comprende estas instrucciones.  Controlar su afeccin.  Recibir ayuda de inmediato si no mejora o si empeora.   Esta informacin no tiene Theme park manager el consejo del mdico. Asegrese de hacerle al mdico cualquier pregunta que tenga.   Document Released: 10/04/2005 Document Revised: 02/18/2015 Elsevier Interactive Patient Education 2016 Elsevier Inc.  Enfermedad por reflujo gastroesofgico en los adultos (Gastroesophageal Reflux Disease, Adult) Normalmente, los alimentos descienden por el esfago y se depositan en el estmago para su digestin. Sin embargo, cuando una persona tiene enfermedad por reflujo gastroesofgico (ERGE), los alimentos y el cido estomacal regresan al esfago. Cuando esto ocurre, el esfago se irrita y se inflama. Con el tiempo, la ERGE puede provocar la formacin de pequeas perforaciones (lceras) en la mucosa del esfago.  CAUSAS Un problema del msculo que se encuentra entre el esfago y Investment banker, corporate (esfnter esofgico inferior o EEI) es la causa de esta enfermedad. Por lo general, el esfnter esofgico inferior se cierra despus de que los alimentos pasan a travs del esfago hacia el [place]. Cuando el EEI est debilitado o no es normal, no se cierra correctamente, lo que permite el paso retrgrado de los alimentos y el cido estomacal al esfago. Algunas sustancias de la dieta, algunos medicamentos y Materials engineer enfermedades pueden debilitar este esfnter, entre ellos:  Consumo de tabaco.  [place].  Hernia de hiato.  Consumo excesivo de alcohol.  Algunos alimentos y [address], como el caf, el chocolate, las cebollas y Interior and spatial designer. FACTORES DE RIESGO Es ms probable que esta afeccin se manifieste en:  Los personas con sobrepeso.  Las personas con trastornos del tejido conjuntivo.  Las personas que  toman antiinflamatorios no esteroides (AINE). SNTOMAS Los sntomas de esta afeccin incluyen lo siguiente:  Merchant navy officer.  Dificultad o dolor al tragar.  Sensacin de Warehouse manager un bulto en la garganta.  Sabor amargo en la boca.  Mal aliento.  Gran cantidad de saliva.  Malestar estomacal o meteorismo.  Flatulencias.  Dolor en el pecho.  Falta de aire o sibilancias.  Tos permanente (crnica) o tos nocturna.  Desgaste el esmalte dental.  Prdida de peso. El dolor en el pecho puede deberse a muchas afecciones diferentes. Consulte al mdico si tiene Journalist, newspaper. DIAGNSTICO El mdico le har una historia clnica y un examen fsico. Para determinar si la ERGE es leve o grave, el mdico tambin puede controlar la respuesta al [place]. Tambin pueden hacerle otros estudios, por ejemplo:  Una endoscopia para examinar el estmago y el esfago con Neomia Dear pequea cmara.  Un estudio que determina el nivel de acidez en el esfago.  Un estudio que mide la presin que hay en el esfago.  Un estudio de deglucin de bario o un estudio modificado de deglucin de bario para mostrar la forma, el tamao y el funcionamiento del esfago. TRATAMIENTO El objetivo del tratamiento es aliviar los sntomas y Automotive engineer las complicaciones. El tratamiento de esta afeccin puede variar en funcin de la gravedad de los sntomas. El mdico podr indicar lo siguiente:  Cambios en  la dieta.  Medicamentos.  [place]. INSTRUCCIONES PARA EL CUIDADO EN EL HOGAR Dieta  Siga la dieta que le haya recomendado el mdico, la cual puede incluir evitar alimentos y bebidas tales como:  Caf y t (con o sin cafena).  Bebidas que contengan alcohol.  Bebidas energizantes y deportivas.  Gaseosas o refrescos.  Chocolate y cacao.  Menta y esencias de [address]  Ajo y cebollas.  Rbano picante.  Alimentos muy condimentados y cidos, entre ellos, pimientos, [place] en polvo, curry en polvo, vinagre,  salsas picantes y [address]  Frutas ctricas y sus jugos, como naranjas, limones y limas.  Alimentos a base de tomates, como salsa roja, [place], salsa y pizza con salsa roja.  Alimentos fritos y Lexicographer, como rosquillas, papas fritas y aderezos con alto contenido de Holiday representative.  Carnes con alto contenido de [place], como hot dogs y cortes grasos de carnes rojas y blancas, por ejemplo, filetes de entrecot, salchicha, jamn y tocino.  Productos lcteos con alto contenido de [place], como [place], [place] y queso crema.  Haga comidas pequeas y frecuentes Freight forwarder de comidas abundantes.  Evite beber mucho lquido con las comidas.  No coma durante las 2 o 3horas previas a la hora de [place].  No se acueste inmediatamente despus de comer.  No haga actividad fsica enseguida despus de comer. Instrucciones generales  Est atento a cualquier cambio en los sntomas.  Tome los medicamentos de venta libre y los recetados solamente como se lo haya indicado el mdico. No tome aspirina, ibuprofeno ni otros antiinflamatorios no esteroides (AINE), a menos que se lo haya indicado el mdico.  No consuma ningn producto que contenga tabaco, lo que incluye cigarrillos, tabaco de Theatre manager y Administrator, Civil Service. Si necesita ayuda para dejar de fumar, consulte al mdico.  Use ropas sueltas. No use prendas ajustadas alrededor de la cintura que ejerzan presin en el abdomen.  Levante (eleve) 6pulgadas (15centmetros) la cabecera de la cama.  Trate de reducir [employer] de estrs con actividades como el yoga o la meditacin. Si necesita ayuda para reducir [employer] de estrs, consulte al mdico.  Si tiene sobrepeso, Media planner un peso saludable. Hable con el mdico acerca de su peso ideal y pdale asesoramiento en cuanto a la dieta que debe seguir para Aeronautical engineer.  Concurra a todas las visitas de control como se lo haya indicado el mdico. Esto es  importante. SOLICITE ATENCIN MDICA SI:  Aparecen nuevos sntomas.  Baja de peso sin causa aparente.  Tiene dificultad para tragar o siente dolor al [employer].  Tiene sibilancias o tos persistente.  Los sntomas no mejoran con Scientist, research (medical).  Tiene la voz ronca. SOLICITE ATENCIN MDICA DE [employer] SI:  Tiene dolor en los brazos, el cuello, los [place], la dentadura o la espalda.  Berenice Primas, se marea o tiene sensacin de desvanecimiento.  Siente falta de aire o Journalist, newspaper.  Vomita y el vmito es parecido a la sangre o a los granos de caf.  Se desmaya.  Las heces son sanguinolentas o de color negro.  No puede tragar, beber o comer.   Esta informacin no tiene Theme park manager el consejo del mdico. Asegrese de hacerle al mdico cualquier pregunta que tenga.   Document Released: 07/14/2005 Document Revised: 06/25/2015 Elsevier Interactive Patient Education [employer]

## 2015-08-03 NOTE — ED Notes (Signed)
C/o mid upper abd pain since this morning with sob.  Pt diaphoretic on triage arrival.  Reports nausea after eating.  Denies chest pain.

## 2015-08-03 NOTE — ED Notes (Signed)
Pt is aware urine is needed, urinal at bedside.  

## 2015-08-04 LAB — BRAIN NATRIURETIC PEPTIDE: B NATRIURETIC PEPTIDE 5: 630.4 pg/mL — AB (ref 0.0–100.0)

## 2015-08-04 NOTE — ED Provider Notes (Signed)
Pt feels better at this time. Dc home in good condition  Azalia Bilis, MD 08/04/15 [phone]

## 2016-01-15 ENCOUNTER — Emergency Department (HOSPITAL_COMMUNITY): Payer: Medicare Other

## 2016-01-15 ENCOUNTER — Inpatient Hospital Stay (HOSPITAL_COMMUNITY)
Admission: EM | Admit: 2016-01-15 | Discharge: 2016-01-17 | DRG: 292 | Disposition: A | Discharge status: Home / Self Care | Payer: Medicare Other | Attending: Family Medicine | Admitting: Family Medicine

## 2016-01-15 ENCOUNTER — Encounter (HOSPITAL_COMMUNITY): Payer: Self-pay

## 2016-01-15 DIAGNOSIS — E1142 Type 2 diabetes mellitus with diabetic polyneuropathy: Secondary | ICD-10-CM | POA: Diagnosis present

## 2016-01-15 DIAGNOSIS — Z794 Long term (current) use of insulin: Secondary | ICD-10-CM | POA: Diagnosis not present

## 2016-01-15 DIAGNOSIS — N189 Chronic kidney disease, unspecified: Secondary | ICD-10-CM | POA: Insufficient documentation

## 2016-01-15 DIAGNOSIS — I5023 Acute on chronic systolic (congestive) heart failure: Secondary | ICD-10-CM | POA: Diagnosis not present

## 2016-01-15 DIAGNOSIS — Z87891 Personal history of nicotine dependence: Secondary | ICD-10-CM

## 2016-01-15 DIAGNOSIS — R0602 Shortness of breath: Secondary | ICD-10-CM | POA: Diagnosis present

## 2016-01-15 DIAGNOSIS — I509 Heart failure, unspecified: Secondary | ICD-10-CM

## 2016-01-15 DIAGNOSIS — E118 Type 2 diabetes mellitus with unspecified complications: Secondary | ICD-10-CM | POA: Diagnosis not present

## 2016-01-15 DIAGNOSIS — N400 Enlarged prostate without lower urinary tract symptoms: Secondary | ICD-10-CM | POA: Diagnosis present

## 2016-01-15 DIAGNOSIS — N182 Chronic kidney disease, stage 2 (mild): Secondary | ICD-10-CM | POA: Diagnosis present

## 2016-01-15 DIAGNOSIS — E11319 Type 2 diabetes mellitus with unspecified diabetic retinopathy without macular edema: Secondary | ICD-10-CM | POA: Diagnosis present

## 2016-01-15 DIAGNOSIS — I420 Dilated cardiomyopathy: Secondary | ICD-10-CM | POA: Diagnosis present

## 2016-01-15 DIAGNOSIS — Z7902 Long term (current) use of antithrombotics/antiplatelets: Secondary | ICD-10-CM | POA: Diagnosis not present

## 2016-01-15 DIAGNOSIS — I5042 Chronic combined systolic (congestive) and diastolic (congestive) heart failure: Secondary | ICD-10-CM | POA: Diagnosis present

## 2016-01-15 DIAGNOSIS — K219 Gastro-esophageal reflux disease without esophagitis: Secondary | ICD-10-CM | POA: Diagnosis present

## 2016-01-15 DIAGNOSIS — N179 Acute kidney failure, unspecified: Secondary | ICD-10-CM | POA: Diagnosis present

## 2016-01-15 DIAGNOSIS — E1122 Type 2 diabetes mellitus with diabetic chronic kidney disease: Secondary | ICD-10-CM | POA: Diagnosis present

## 2016-01-15 DIAGNOSIS — I447 Left bundle-branch block, unspecified: Secondary | ICD-10-CM | POA: Diagnosis present

## 2016-01-15 DIAGNOSIS — R140 Abdominal distension (gaseous): Secondary | ICD-10-CM

## 2016-01-15 DIAGNOSIS — I130 Hypertensive heart and chronic kidney disease with heart failure and stage 1 through stage 4 chronic kidney disease, or unspecified chronic kidney disease: Secondary | ICD-10-CM | POA: Diagnosis present

## 2016-01-15 DIAGNOSIS — I10 Essential (primary) hypertension: Secondary | ICD-10-CM | POA: Diagnosis not present

## 2016-01-15 DIAGNOSIS — R0600 Dyspnea, unspecified: Secondary | ICD-10-CM | POA: Diagnosis not present

## 2016-01-15 LAB — BASIC METABOLIC PANEL
Anion gap: 10 (ref 5–15)
BUN: 19 mg/dL (ref 6–20)
CALCIUM: 9.3 mg/dL (ref 8.9–10.3)
CHLORIDE: 106 mmol/L (ref 101–111)
CO2: 27 mmol/L (ref 22–32)
CREATININE: 1.24 mg/dL (ref 0.61–1.24)
GFR calc Af Amer: >60 mL/min (ref >60)
GFR, EST NON AFRICAN AMERICAN: 55 mL/min — AB (ref >60)
GLUCOSE: 162 mg/dL — AB (ref 65–99)
POTASSIUM: 4.4 mmol/L (ref 3.5–5.1)
SODIUM: 143 mmol/L (ref 135–145)

## 2016-01-15 LAB — HEPATIC FUNCTION PANEL
ALBUMIN: 4.1 g/dL (ref 3.5–5.0)
ALK PHOS: 71 U/L (ref 38–126)
ALT: 18 U/L (ref 17–63)
AST: 22 U/L (ref 15–41)
BILIRUBIN TOTAL: 1.7 mg/dL — AB (ref 0.3–1.2)
Bilirubin, Direct: 0.3 mg/dL (ref 0.1–0.5)
Indirect Bilirubin: 1.4 mg/dL — ABNORMAL HIGH (ref 0.3–0.9)
Total Protein: 7.6 g/dL (ref 6.5–8.1)

## 2016-01-15 LAB — CREATININE, SERUM
CREATININE: 1.43 mg/dL — AB (ref 0.61–1.24)
GFR, EST AFRICAN AMERICAN: 54 mL/min — AB (ref >60)
GFR, EST NON AFRICAN AMERICAN: 46 mL/min — AB (ref >60)

## 2016-01-15 LAB — PROTIME-INR
INR: 1.17 (ref 0.00–1.49)
PROTHROMBIN TIME: 15.1 s (ref 11.6–15.2)

## 2016-01-15 LAB — CBC
HCT: 42.4 % (ref 39.0–52.0)
HCT: 43.2 % (ref 39.0–52.0)
HEMOGLOBIN: 14.6 g/dL (ref 13.0–17.0)
Hemoglobin: 14.7 g/dL (ref 13.0–17.0)
MCH: 31.3 pg (ref 26.0–34.0)
MCH: 31 pg (ref 26.0–34.0)
MCHC: 34.4 g/dL (ref 30.0–36.0)
MCHC: 34 g/dL (ref 30.0–36.0)
MCV: 91 fL (ref 78.0–100.0)
MCV: 91.1 fL (ref 78.0–100.0)
PLATELETS: 178 10*3/uL (ref 150–400)
PLATELETS: 175 10*3/uL (ref 150–400)
RBC: 4.66 MIL/uL (ref 4.22–5.81)
RBC: 4.74 MIL/uL (ref 4.22–5.81)
RDW: 13.7 % (ref 11.5–15.5)
RDW: 13.7 % (ref 11.5–15.5)
WBC: 7.5 10*3/uL (ref 4.0–10.5)
WBC: 6 10*3/uL (ref 4.0–10.5)

## 2016-01-15 LAB — GLUCOSE, CAPILLARY: Glucose-Capillary: 140 mg/dL — ABNORMAL HIGH (ref 65–99)

## 2016-01-15 LAB — RAPID URINE DRUG SCREEN, HOSP PERFORMED
Amphetamines: NOT DETECTED
BARBITURATES: NOT DETECTED
Benzodiazepines: NOT DETECTED
COCAINE: NOT DETECTED
Opiates: NOT DETECTED
TETRAHYDROCANNABINOL: NOT DETECTED

## 2016-01-15 LAB — I-STAT TROPONIN, ED: TROPONIN I, POC: 0 ng/mL (ref 0.00–0.08)

## 2016-01-15 LAB — TSH: TSH: 1.3 u[IU]/mL (ref 0.350–4.500)

## 2016-01-15 LAB — BRAIN NATRIURETIC PEPTIDE: B NATRIURETIC PEPTIDE 5: 946.6 pg/mL — AB (ref 0.0–100.0)

## 2016-01-15 MED ORDER — INSULIN GLARGINE 100 UNIT/ML ~~LOC~~ SOLN
40.0000 [IU] | Freq: Every day | SUBCUTANEOUS | Status: DC
  Administered 2016-01-16 – 2016-01-17 (×2): 40 [IU] via SUBCUTANEOUS
  Filled 2016-01-15 (×2): qty 0.4

## 2016-01-15 MED ORDER — ALFUZOSIN HCL ER 10 MG PO TB24
10.0000 mg | ORAL_TABLET | Freq: Every day | ORAL | Status: DC
  Administered 2016-01-16 – 2016-01-17 (×2): 10 mg via ORAL
  Filled 2016-01-15 (×4): qty 1

## 2016-01-15 MED ORDER — SODIUM CHLORIDE 0.9% FLUSH: 3.0000 mL | INTRAVENOUS | Status: DC | PRN

## 2016-01-15 MED ORDER — SODIUM CHLORIDE 0.9 % IV SOLN: 250.0000 mL | INTRAVENOUS | Status: DC | PRN

## 2016-01-15 MED ORDER — ENOXAPARIN SODIUM 40 MG/0.4ML ~~LOC~~ SOLN
40.0000 mg | SUBCUTANEOUS | Status: DC
  Administered 2016-01-16 (×2): 40 mg via SUBCUTANEOUS
  Filled 2016-01-15 (×2): qty 0.4

## 2016-01-15 MED ORDER — FAMOTIDINE 20 MG PO TABS
20.0000 mg | ORAL_TABLET | Freq: Two times a day (BID) | ORAL | Status: DC
  Administered 2016-01-16 – 2016-01-17 (×4): 20 mg via ORAL
  Filled 2016-01-15 (×5): qty 1

## 2016-01-15 MED ORDER — POLYETHYLENE GLYCOL 3350 17 G PO PACK: 17.0000 g | PACK | Freq: Every day | ORAL | Status: DC | PRN

## 2016-01-15 MED ORDER — CLOPIDOGREL BISULFATE 75 MG PO TABS
75.0000 mg | ORAL_TABLET | Freq: Every day | ORAL | Status: DC
  Administered 2016-01-16 – 2016-01-17 (×2): 75 mg via ORAL
  Filled 2016-01-15 (×2): qty 1

## 2016-01-15 MED ORDER — ENALAPRIL MALEATE 2.5 MG PO TABS: 2.5000 mg | ORAL_TABLET | Freq: Every day | ORAL | Status: DC

## 2016-01-15 MED ORDER — FUROSEMIDE 10 MG/ML IJ SOLN
80.0000 mg | Freq: Two times a day (BID) | INTRAMUSCULAR | Status: DC
  Administered 2016-01-15: 80 mg via INTRAVENOUS
  Filled 2016-01-15: qty 8

## 2016-01-15 MED ORDER — INSULIN ASPART 100 UNIT/ML ~~LOC~~ SOLN
0.0000 [IU] | Freq: Every day | SUBCUTANEOUS | Status: DC
  Administered 2016-01-16: 3 [IU] via SUBCUTANEOUS

## 2016-01-15 MED ORDER — ATORVASTATIN CALCIUM 40 MG PO TABS
40.0000 mg | ORAL_TABLET | Freq: Every day | ORAL | Status: DC
  Administered 2016-01-16 (×2): 40 mg via ORAL
  Filled 2016-01-15 (×2): qty 1

## 2016-01-15 MED ORDER — FUROSEMIDE 10 MG/ML IJ SOLN
40.0000 mg | Freq: Once | INTRAMUSCULAR | Status: AC
  Administered 2016-01-15: 40 mg via INTRAVENOUS
  Filled 2016-01-15: qty 4

## 2016-01-15 MED ORDER — INSULIN ASPART 100 UNIT/ML ~~LOC~~ SOLN
0.0000 [IU] | Freq: Three times a day (TID) | SUBCUTANEOUS | Status: DC
  Administered 2016-01-16: 3 [IU] via SUBCUTANEOUS
  Administered 2016-01-16: 2 [IU] via SUBCUTANEOUS
  Administered 2016-01-16: 3 [IU] via SUBCUTANEOUS
  Administered 2016-01-17: 2 [IU] via SUBCUTANEOUS

## 2016-01-15 MED ORDER — SODIUM CHLORIDE 0.9% FLUSH
3.0000 mL | Freq: Two times a day (BID) | INTRAVENOUS | Status: DC
  Administered 2016-01-16 – 2016-01-17 (×4): 3 mL via INTRAVENOUS

## 2016-01-15 MED ORDER — GABAPENTIN 300 MG PO CAPS
300.0000 mg | ORAL_CAPSULE | Freq: Three times a day (TID) | ORAL | Status: DC
  Administered 2016-01-15 – 2016-01-17 (×5): 300 mg via ORAL
  Filled 2016-01-15 (×5): qty 1

## 2016-01-15 MED ORDER — CARVEDILOL 3.125 MG PO TABS
3.1250 mg | ORAL_TABLET | Freq: Two times a day (BID) | ORAL | Status: DC
  Administered 2016-01-16 (×2): 3.125 mg via ORAL
  Filled 2016-01-15 (×2): qty 1

## 2016-01-15 NOTE — H&P (Signed)
Family Medicine Teaching [facility] Admission History and Physical Service Pager: [phone]  Patient name: Stanley Farmer Medical record number: [id number] Date of birth: [birth year] Age: 75 y.o. Gender: male  Primary Care Provider: [name], MD Consultants: none Code Status: FULL  Chief Complaint: SOB and abdominal bloating  Assessment and Plan: Stanley Farmer is a 75 y.o. male presenting with abdominal bloating and shortness of breath  . PMH is significant for HTN, DM, CKDII, CHF (EF 15% in 2015).   Dyspnea: Likely 2/2 CHF exacerbation; Patient's vitals are stable and is on room air with good saturations and with normal respiratory effort. Differentials include CHF exacerbation (with bi-basilar crackles, JVD with possible heptojugular reflex, worsened LE edema). Patient has EF of 15% on ECHO from 2015;  BNP 946. CXR with only trace right pleural effusion. S/p IV Lasix 40mg .  - admit to teaching service, attending Dr. Lum Babe - IV Lasix 80mg  BID  - UDS - ECHO ordered - TSH - Lipid panel - daily weights; strict I/O - vitals with pulse ox per floor protocol - PT consult  Abdominal discomfort, RUQ: unknown etiology at this time. Considering if abdominal swelling is symptom of right heart failure (hepatic congestion). Considered liver pathology but no history of this; per chart review normal LFTs but mildly elevated t-bili in 07/2015. No h/o hepatitis. Denies significant EtOH use. No AST/ALT done in initial workup. Patient does have h/o cholecystectomy. Consider possible complications of adhesions if labs are not concerning. Constipation was considered but patient denied this being an issue. - LFTs to evaluate liver function; Hepatitis panel ordered as well - Abdominal US - Consider additional imaging if deemed necessary.  Heart Failure (HFrEF): 2015 ECHO with EF 15%. 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 Hgb A1c 6.5 in 06/2015. Patient took Insulin dose this AM. CBG stable on presentation - Lantus 40 units starting 3/31 - CBG and SSI ACHS - A1c ordered   Cr Elevation: Baseline ~1.2. Cr day of admission 1.43. Possibly drug induced as patient does not appear dehydrated.  - Will monitor - Hold ACEi until improves.  LBBB on EKG: - Unchanged from previous EKG.   BPH:  - Alfuzosin 10mg  dailly  Reflux:  - continue Pepcid 20mg    FEN/GI: heart healthy carb modified; Miralax daily PRN Prophylaxis: Lovenox  Disposition: admit to teaching service  History of Present Illness:  Stanley Farmer is a 75 y.o. male presenting with abdominal bloating and shortness of breath.   Patient reports of abdominal swelling and subsequent shortness of breath; shortness of breath began around 5 am on morning of admission. Reports of intermittent abdominal swelling over the past year. He reports swelling worsens after eating a meal and somewhat improves with a bowel movement and self-resolves. Last night abdominal swelling did not improve and he subsequently developed shortness of breath which he feels like is due to his abdominal swelling. States he is more comfortable laying on his left side versus right. Denies diarrhea or constipation  He also reports of orthopnea and worsening LE edema for the past 3 days. Denies chest pain, focal weakness, nausea, vomiting, diaphoresis  Review Of Systems: Per HPI with the following additions:  Otherwise the remainder of the systems were negative.  Patient Active Problem List   Diagnosis Date Noted  . CHF exacerbation (HCC) 01/15/2016  .  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 Chronic combined systolic and diastolic CHF (congestive heart failure) (HCC) 04/21/2014  . Acute on chronic combined systolic and diastolic CHF (congestive heart failure) (HCC) 04/21/2014  . Acute  respiratory failure (HCC) 04/21/2014  . Essential hypertension 04/21/2014  . IDDM (insulin dependent diabetes mellitus) (HCC) 04/21/2014  . Non-English speaking patient 04/21/2014  . 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 Overweight 04/21/2014  . Acute on chronic systolic heart failure (HCC) 04/21/2014    Past Medical History: Past Medical History  Diagnosis Date  . Hypertension   . Diabetes mellitus without complication (HCC)   . CHF (congestive heart failure) (HCC)     Past Surgical History: Past Surgical History  Procedure Laterality Date  . Kidney stone surgery      Social History: Social History  Substance Use Topics  . Smoking status: Former Games developer  . Smokeless tobacco: None  . Alcohol Use: No   Please also refer to relevant sections of EMR.  Family History: Family History  Problem Relation Age of Onset  . Diabetes Mother     Allergies and Medications: No Known Allergies No current facility-administered medications on file prior to encounter.   Current Outpatient Prescriptions on File Prior to Encounter  Medication Sig Dispense Refill  . alfuzosin (UROXATRAL) 10 MG 24 hr tablet Take 10 mg by mouth daily.    Marland Kitchen atorvastatin (LIPITOR) 40 MG tablet Take 40 mg by mouth at bedtime.     . carvedilol (COREG) 3.125 MG tablet Take 3.125 mg by mouth 2 (two) times daily with a meal.    . clopidogrel (PLAVIX) 75 MG tablet Take 75 mg by mouth daily.    . enalapril (VASOTEC) 2.5 MG tablet Take 2.5 mg by mouth daily.    . furosemide (LASIX) 80 MG tablet Take 80-160 mg by mouth 2 (two) times daily. Take  by mouth in the morning and take  by mouth at lunch.    . gabapentin (NEURONTIN) 300 MG capsule Take 300 mg by mouth 3 (three) times daily.    . insulin aspart (NOVOLOG) 100 UNIT/ML injection Inject 20-32 Units into the skin 3 (three) times daily with meals. Use 20 units every morning,  use 28-32 units at lunch and 28-32 units every evening    . insulin glargine (LANTUS) 100 UNIT/ML injection Inject 80 Units into the skin every morning.     . metoCLOPramide (REGLAN) 5 MG tablet Take 5 mg by mouth 4 (four) times daily -  before meals and at bedtime.    . bisacodyl (DULCOLAX) 5 MG EC tablet Take 1 tablet (5 mg total) by mouth daily as needed for moderate constipation. (Patient taking differently: Take 5 mg by mouth 2 (two) times daily. ) 14 tablet 0  . famotidine (PEPCID) 20 MG tablet Take 1 tablet (20 mg total) by mouth 2 (two) times daily. 30 tablet 0  . polyethylene glycol powder (GLYCOLAX/MIRALAX) powder Take 127.5 g by mouth daily as needed for mild constipation. 255 g 0    Objective: BP 122/74 mmHg  Pulse 71  Temp(Src) 99 F (37.2 C) (Oral)  Resp 18  Wt 86.728 kg (191 lb 3.2 oz)  SpO2 92% Exam: General: NAD, Speaking in spanish comfortably  Eyes: EMOI, PERRLA (although very difficult to see) ENTM: MMM, OP clear Neck: normal ROM; JVD to mandible, positive hepatojugular reflex  Cardiovascular: RRR, no m/r/g Respiratory: normal effort, bibasilar  crackles, on room air with good saturations Abdomen: soft, mild tenderness to palpation in right upper quadrants but no guarding or rebound, + BS MSK: able to move all extremities without difficulty, +2 pitting edema to tibial tuberosity bilaterally. No pain w/ palpation. Posterior tibialis/dorsalis pedis pulses intact bilaterally. Skin: warm and dry Neuro: CN 2-12 grossly intact, 5/5 strength in upper and lower extremities Psych: normal speech, normal mood and affect  Labs and Imaging: CBC BMET   Recent Labs Lab 01/15/16 0827  WBC 7.5  HGB 14.6  HCT 42.4  PLT 178    Recent Labs Lab 01/15/16 0827  NA 143  K 4.4  CL 106  CO2 27  BUN 19  CREATININE 1.24  GLUCOSE 162*  CALCIUM 9.3    BNP 946.6 EKG with LBBB, unchanged from prior Trop 0.00  CXR:  FINDINGS: Chronic cardiomegaly appears stable. Other  mediastinal contours are within normal limits. Upper limits of normal to hyperinflated lung volumes. No pneumothorax. Trace right pleural effusion. No consolidation. Basilar predominant increased interstitial markings are stable. Pulmonary vascularity appears within normal limits, no acute edema. No acute osseous abnormality identified. Flowing osteophytes in the thoracic spine.  IMPRESSION: Trace right pleural effusion. Otherwise stable chest with chronic cardiomegaly and chronic interstitial markings at the bases which might reflect scarring.  Palma Holter, MD 01/15/2016, 1:57 PM PGY-1, [facility] FPTS Intern pager: [phone], text pages welcome   Upper Level Addendum:  I have seen and evaluated this patient along with Dr. Ottie Glazier and reviewed the above note, making necessary revisions in red.   Kathee Delton, MD,MS,  PGY2 01/15/2016 9:09 PM

## 2016-01-15 NOTE — ED Notes (Signed)
Family medicine at the bedside.

## 2016-01-15 NOTE — ED Provider Notes (Signed)
CSN: [id number]     Arrival date & time 01/15/16  0807 History   First MD Initiated Contact with Patient 01/15/16 1007     Chief Complaint  Patient presents with  . Shortness of Breath     (Consider location/radiation/quality/duration/timing/severity/associated sxs/prior Treatment) HPI Comments: Patient with a history of HTN, DM, and CHF presents today with SOB.  He reports that he has been feeling SOB for the past couple of days, but it was worse this morning around 5 AM.  He reports that the SOB worsens when lying down flat, but states that he always sleeps on 2 pillows.  He has had an associated cough intermittently over the past week.  He has also had increased bilateral LE edema over the past couple of days.  He does not do daily weights.  He had a Echo in 2015, which showed an EF of 15%.  He is currently on Lasix 80 mg once a day and 120 mg once a day.  His daughter is unsure if he has been taking his medication.  She usually sets it out for him, but hasn't been over the past week.  He denies fever, chills, hemoptysis, chest pain, dizziness, or syncope.  He also  Reports abdominal distension over the past couple of days, which he has also had in the past.  He was seen in the ED for similar symptoms five months ago.  At that time he had a CT of his abdomen, which was negative for Ascites or obstruction.  His SOB at that time was thought to be caused by a CHF exacerbation and improved with IV Lasix.  He reports that his last BM was this morning and was normal.  He denies nausea, vomiting, diarrhea, or any other symptoms.    The history is provided by the patient.    Past Medical History  Diagnosis Date  . Hypertension   . Diabetes mellitus without complication (HCC)   . CHF (congestive heart failure) [facility])    Past Surgical History  Procedure Laterality Date  . Kidney stone surgery     Family History  Problem Relation Age of Onset  . Diabetes Mother    Social History  Substance Use  Topics  . Smoking status: Former Games developer  . Smokeless tobacco: None  . Alcohol Use: No    Review of Systems  All other systems reviewed and are negative.     Allergies  Review of patient's allergies indicates no known allergies.  Home Medications   Prior to Admission medications   Medication Sig Start Date End Date Taking? Authorizing Provider  alfuzosin (UROXATRAL) 10 MG 24 hr tablet Take 10 mg by mouth daily.    Historical Provider, MD  atorvastatin (LIPITOR) 40 MG tablet Take 40 mg by mouth at bedtime.     Historical Provider, MD  bisacodyl (DULCOLAX) 5 MG EC tablet Take 1 tablet (5 mg total) by mouth daily as needed for moderate constipation. Patient taking differently: Take 5 mg by mouth 2 (two) times daily.  04/28/14   Trixie Dredge, PA-C  carvedilol (COREG) 3.125 MG tablet Take 3.125 mg by mouth 2 (two) times daily with a meal.    Historical Provider, MD  clopidogrel (PLAVIX) 75 MG tablet Take 75 mg by mouth daily.    Historical Provider, MD  enalapril (VASOTEC) 2.5 MG tablet Take 2.5 mg by mouth daily.    Historical Provider, MD  famotidine (PEPCID) 20 MG tablet Take 1 tablet (20 mg total) by mouth  2 (two) times daily. 08/03/15   Rolan Bucco, MD  furosemide (LASIX) 80 MG tablet Take 80-160 mg by mouth 2 (two) times daily. Take 160mg  by mouth in the morning and take 80mg  by mouth at lunch.    Historical Provider, MD  gabapentin (NEURONTIN) 300 MG capsule Take 300 mg by mouth 3 (three) times daily.    Historical Provider, MD  insulin aspart (NOVOLOG) 100 UNIT/ML injection Inject 20-32 Units into the skin 3 (three) times daily with meals. Use 20 units every morning, use 28-32 units at lunch and 28-32 units every evening    Historical Provider, MD  insulin glargine (LANTUS) 100 UNIT/ML injection Inject 80 Units into the skin every morning.     Historical Provider, MD  metoCLOPramide (REGLAN) 5 MG tablet Take 5 mg by mouth 4 (four) times daily -  before meals and at bedtime.     Historical Provider, MD  polyethylene glycol powder (GLYCOLAX/MIRALAX) powder Take 127.5 g by mouth daily as needed for mild constipation. 10/17/14   Rolland Porter, MD   BP 112/76 mmHg  Pulse 91  Temp(Src) 99 F (37.2 C) (Oral)  Resp 19  Wt 86.728 kg  SpO2 99% Physical Exam  Constitutional: He appears well-developed and well-nourished. No distress.  HENT:  Head: Normocephalic and atraumatic.  Mouth/Throat: Oropharynx is clear and moist.  Neck: Normal range of motion. Neck supple.  Cardiovascular: Normal rate, regular rhythm and normal heart sounds.   Pulmonary/Chest: Effort normal. No accessory muscle usage. No tachypnea. No respiratory distress. He has decreased breath sounds. He has no wheezes. He has no rhonchi. He has rales.  Bilateral crackles at the base of both lungs  Musculoskeletal: Normal range of motion.  2+ bilateral pitting edema from mid tibia distally  Neurological: He is alert.  Skin: Skin is warm and dry. He is not diaphoretic.  Psychiatric: He has a normal mood and affect.  Nursing note and vitals reviewed.   ED Course  Procedures (including critical care time) Labs Review Labs Reviewed  BASIC METABOLIC PANEL - Abnormal; Notable for the following:    Glucose, Bld 162 (*)    GFR calc non Af Amer 55 (*)    All other components within normal limits  CBC  PROTIME-INR  I-STAT TROPOININ, ED    Imaging Review Dg Chest 2 View  01/15/2016  CLINICAL DATA:  75 year old male with shortness of breath and chest pain for 2 days. Initial encounter. EXAM: CHEST  2 VIEW COMPARISON:  08/03/2015 and earlier. FINDINGS: Chronic cardiomegaly appears stable. Other mediastinal contours are within normal limits. Upper limits of normal to hyperinflated lung volumes. No pneumothorax. Trace right pleural effusion. No consolidation. Basilar predominant increased interstitial markings are stable. Pulmonary vascularity appears within normal limits, no acute edema. No acute osseous  abnormality identified. Flowing osteophytes in the thoracic spine. IMPRESSION: Trace right pleural effusion. Otherwise stable chest with chronic cardiomegaly and chronic interstitial markings at the bases which might reflect scarring. Electronically Signed   By: Odessa Fleming M.D.   On: 01/15/2016 09:24   I have personally reviewed and evaluated these images and lab results as part of my medical decision-making.   EKG Interpretation   Date/Time:  Thursday January 15 2016 08:13:02 EDT Ventricular Rate:  115 PR Interval:  176 QRS Duration: 192 QT Interval:  400 QTC Calculation: 553 R Axis:   -64 Text Interpretation:  Sinus tachycardia with frequent and consecutive  Premature ventricular complexes and Fusion complexes Left axis deviation  Left bundle  branch block No significant change since last tracing  Confirmed by [facility]  MD, Alphonzo Lemmings ([id number]) on 01/15/2016 11:16:30 AM      MDM   Final diagnoses:  None   Patient with a history of CHF presents today with bilateral LE edema and SOB for the past couple of days.  History and physical most consistent with CHF exacerbation.  BNP elevated at 946.   It sounds like he has not been compliant with his Lasix over the past week.   Patient given IV Lasix in the ED.  No ischemic changes on EKG.  Troponin negative.  CXR showing trace right pleural effusion.  Patient admitted to [facility] Medicine Teaching service for management of CHF exacerbation.      Santiago Glad, PA-C 01/17/16 1514  Gwyneth Sprout, MD 01/19/16 [phone]

## 2016-01-15 NOTE — ED Notes (Signed)
Pt presents with family to interpret.  Grandson reports pt became short of breath that worsened today.  Pt reports productive cough; swelling in abdomen and both legs.

## 2016-01-16 ENCOUNTER — Inpatient Hospital Stay (HOSPITAL_COMMUNITY): Payer: Medicare Other

## 2016-01-16 DIAGNOSIS — N182 Chronic kidney disease, stage 2 (mild): Secondary | ICD-10-CM

## 2016-01-16 DIAGNOSIS — I5023 Acute on chronic systolic (congestive) heart failure: Secondary | ICD-10-CM

## 2016-01-16 DIAGNOSIS — N189 Chronic kidney disease, unspecified: Secondary | ICD-10-CM | POA: Insufficient documentation

## 2016-01-16 DIAGNOSIS — I509 Heart failure, unspecified: Secondary | ICD-10-CM | POA: Insufficient documentation

## 2016-01-16 DIAGNOSIS — R140 Abdominal distension (gaseous): Secondary | ICD-10-CM | POA: Insufficient documentation

## 2016-01-16 DIAGNOSIS — R0600 Dyspnea, unspecified: Secondary | ICD-10-CM

## 2016-01-16 DIAGNOSIS — E118 Type 2 diabetes mellitus with unspecified complications: Secondary | ICD-10-CM | POA: Insufficient documentation

## 2016-01-16 LAB — COMPREHENSIVE METABOLIC PANEL
ALT: 16 U/L — ABNORMAL LOW (ref 17–63)
AST: 19 U/L (ref 15–41)
Albumin: 4 g/dL (ref 3.5–5.0)
Alkaline Phosphatase: 68 U/L (ref 38–126)
Anion gap: 10 (ref 5–15)
BILIRUBIN TOTAL: 1.8 mg/dL — AB (ref 0.3–1.2)
BUN: 26 mg/dL — AB (ref 6–20)
CALCIUM: 9.2 mg/dL (ref 8.9–10.3)
CO2: 28 mmol/L (ref 22–32)
CREATININE: 1.57 mg/dL — AB (ref 0.61–1.24)
Chloride: 102 mmol/L (ref 101–111)
GFR calc Af Amer: 48 mL/min — ABNORMAL LOW (ref >60)
GFR, EST NON AFRICAN AMERICAN: 41 mL/min — AB (ref >60)
Glucose, Bld: 215 mg/dL — ABNORMAL HIGH (ref 65–99)
POTASSIUM: 3.9 mmol/L (ref 3.5–5.1)
Sodium: 140 mmol/L (ref 135–145)
TOTAL PROTEIN: 7.5 g/dL (ref 6.5–8.1)

## 2016-01-16 LAB — GLUCOSE, CAPILLARY
GLUCOSE-CAPILLARY: 179 mg/dL — AB (ref 65–99)
Glucose-Capillary: 212 mg/dL — ABNORMAL HIGH (ref 65–99)
Glucose-Capillary: 271 mg/dL — ABNORMAL HIGH (ref 65–99)
Glucose-Capillary: 274 mg/dL — ABNORMAL HIGH (ref 65–99)

## 2016-01-16 LAB — ECHOCARDIOGRAM COMPLETE
Height: 64 in
WEIGHTICAEL: 2896 [oz_av]

## 2016-01-16 LAB — CBC
HEMATOCRIT: 41.8 % (ref 39.0–52.0)
Hemoglobin: 14.2 g/dL (ref 13.0–17.0)
MCH: 30.9 pg (ref 26.0–34.0)
MCHC: 34 g/dL (ref 30.0–36.0)
MCV: 90.9 fL (ref 78.0–100.0)
Platelets: 176 10*3/uL (ref 150–400)
RBC: 4.6 MIL/uL (ref 4.22–5.81)
RDW: 13.6 % (ref 11.5–15.5)
WBC: 6 10*3/uL (ref 4.0–10.5)

## 2016-01-16 LAB — LIPID PANEL
CHOLESTEROL: 110 mg/dL (ref 0–200)
HDL: 29 mg/dL — ABNORMAL LOW (ref >40)
LDL CALC: 59 mg/dL (ref 0–99)
Total CHOL/HDL Ratio: 3.8 RATIO
Triglycerides: 110 mg/dL (ref <150)
VLDL: 22 mg/dL (ref 0–40)

## 2016-01-16 MED ORDER — FUROSEMIDE 80 MG PO TABS
160.0000 mg | ORAL_TABLET | Freq: Every day | ORAL | Status: DC
  Administered 2016-01-16 – 2016-01-17 (×2): 160 mg via ORAL
  Filled 2016-01-16 (×2): qty 2

## 2016-01-16 MED ORDER — FUROSEMIDE 80 MG PO TABS
80.0000 mg | ORAL_TABLET | Freq: Every evening | ORAL | Status: DC
  Administered 2016-01-16: 80 mg via ORAL
  Filled 2016-01-16: qty 1

## 2016-01-16 NOTE — Progress Notes (Signed)
Late entry  Patient had repeat echo with 2 different EFs read: 10-15% vs 20-25%. Either way, patient does not have a cardiologist and would benefit from going to HF clinic +/- ICD placement.  Consulted cardiology to evaluate patient. Hopeful discharge on 4/1.  Joanna Puff, MD [facility] Family Medicine Resident  01/16/2016, 6:52 PM

## 2016-01-16 NOTE — Progress Notes (Signed)
Family Medicine Teaching Service Daily Progress Note Intern Pager: [phone]  Patient name: Stanley Farmer Medical record number: [id number] Date of birth: 10-10-1941 Age: 75 y.o. Gender: male  Primary Care Provider: [name], MD Consultants: none Code Status: FULL  Pt Overview and Major Events to Date:  3/20: admit for CHF exacerbation   Assessment and Plan: [name] is a 75 y.o. male presenting with abdominal bloating and shortness of breath . PMH is significant for HTN, DM, CKDII, CHF (EF 15% in 2015).   Dyspnea: Likely 2/2 CHF exacerbation: Patient has EF of 15% on ECHO from 2015.Patient may not have been taking medications correctly this past week per daughter.  S/p IV Lasix  in ED. TSH 1.3 (wnl) UDS negative.  - Strict I/O: Out 1.9L + x1 unmeasured; net -1.855L - Daily weight: 191 >181lb - Switched to PO Lasix  AM, Lasix  PM (home dose) - ECHO ordered - vitals with pulse ox per floor protocol - PT consult: HH PT, Rolling walker with 5" wheels  Abdominal discomfort, RUQ, resolved: unknown etiology at this time. Per report this is intermittent and chronic but acutely worsened on day of admission. Considering if abdominal swelling is symptom of right heart failure (hepatic congestion). Considered liver pathology but no history of this; per chart review normal LFTs but mildly elevated t-bili in 07/2015. No h/o hepatitis. Denies significant EtOH use.Patient does have h/o cholecystectomy. Consider possible complications of adhesions if labs are not concerning.  - Abdominal US: mildly dilated 7mm CBD but similar to prior imaging (common finding s/p cholecystectomy); otherwise normal  - LFTs are wnl, but with mildly elevated tbili 1.8 - Hepatitis panel in process  Heart Failure (HFrEF): 2015 ECHO with EF 15%. No cardiologist; PCP managing  - continue home Coreg 3.125 BID - Hold Enalapril 2.5mg  daily >> restart in AM if Cr improves - Lipid panel: chol 110,  HDL 29, TAG 110, LDL 59 - Continue Lipitor  daily   AKI: On admission, Creatinine 1.2 (baseline 1.2)  > 1.57 this AM. Likely due to diuresis.  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 Hgb A1c 6.5 in 06/2015.  CBG 140-215, AM 179. - Lantus 40 units starting 3/31 - CBG and sensitive  SSI ACHS - A1c in process  LBBB on EKG: Unchanged from previous EKG  BPH:  - Alfuzosin  dailly  Reflux:  - continue Pepcid    FEN/GI: heart healthy carb modified; Miralax daily PRN Prophylaxis: Lovenox  Disposition: pending management/evaluation of CHF  Subjective:  States his breathing is much better from yesterday; no SOB. Abdominal bloating/swelling has also resolved. Has a BM this morning which was normal; no blood. He has not eaten breakfast, but ate dinner last night. States, he had some abdominal fullness after eating dinner which resolved.   Objective: Temp:  [98 F (36.7 C)-99 F (37.2 C)] 98.2 F (36.8 C) (03/31 0543) Pulse Rate:  [70-101] 77 (03/31 0543) Resp:  [15-28] 20 (03/31 0543) BP: (109-135)/(64-88) 124/70 mmHg (03/31 0543) SpO2:  [88 %-100 %] 96 % (03/31 0543) Weight:  [83.961 kg (185 lb 1.6 oz)-86.728 kg (191 lb 3.2 oz)] 83.961 kg (185 lb 1.6 oz) (03/30 2000) Physical Exam: General: NAD, working with PT Cardiovascular: RRR. No m/r/g Respiratory: normal effort, CTAB  Abdomen: soft, NT, ND, + BS Extremities: pitting edema mid shin, no calf tenderness   Laboratory:  Recent Labs Lab  01/15/16 0827 01/15/16 2000 01/16/16 0435  WBC 7.5 6.0 6.0  HGB 14.6 14.7 14.2  HCT 42.4 43.2 41.8  PLT 178 175 176    Recent Labs Lab 01/15/16 0827 01/15/16 2000 01/16/16 0435  NA 143  --  140  K 4.4  --  3.9  CL 106  --  102  CO2 27  --  28  BUN 19  --  26*  CREATININE 1.24 1.43* 1.57*  CALCIUM 9.3  --  9.2  PROT  --  7.6 7.5  BILITOT  --  1.7* 1.8*  ALKPHOS  --   71 68  ALT  --  18 16*  AST  --  22 19  GLUCOSE 162*  --  215*   On admission:  BNP 946.6 EKG with LBBB, unchanged from prior Trop 0.00  TSH 1.3 (wnl) Lipid panel: chol 110, HDL 29, TAG 110, LDL 59 UDS: negative   Imaging/Diagnostic Tests: 3/30: CXR:  FINDINGS: Chronic cardiomegaly appears stable. Other mediastinal contours are within normal limits. Upper limits of normal to hyperinflated lung volumes. No pneumothorax. Trace right pleural effusion. No consolidation. Basilar predominant increased interstitial markings are stable. Pulmonary vascularity appears within normal limits, no acute edema. No acute osseous abnormality identified. Flowing osteophytes in the thoracic spine.  IMPRESSION: Trace right pleural effusion. Otherwise stable chest with chronic cardiomegaly and chronic interstitial markings at the bases which might reflect scarring.  Palma Holter, MD 01/16/2016, 6:40 AM PGY-1, [facility] FPTS Intern pager: [phone], text pages welcome

## 2016-01-16 NOTE — Progress Notes (Signed)
  Echocardiogram 2D Echocardiogram has been performed.  Tye Savoy 01/16/2016, 12:24 PM

## 2016-01-16 NOTE — Discharge Summary (Signed)
Family Medicine Teaching [facility] Discharge Summary  Patient name: Stanley Farmer Medical record number: [id number] Date of birth: 1941/06/08 Age: 75 y.o. Gender: male Date of Admission: 01/15/2016  Date of Discharge: 01/17/2016  Admitting Physician: Doreene Eland, MD  Primary Care Provider: [name], MD Consultants: none  Indication for Hospitalization: SOB and abdominal bloating likely 2/2 CHF Exacerbation  Discharge Diagnoses/Problem List:  HFrEF with exacerbation Abdominal discomfort AKI T2DM with neuropathy/retinopathy LBBB BPH GERD  Disposition: home  Discharge Condition: improved and stable   Discharge Exam:  Filed Vitals:   01/17/16 0601 01/17/16 1207  BP: 107/63 122/68  Pulse: 66 78  Temp: 98.5 F (36.9 C) 98 F (36.7 C)  Resp: 20 18   Gen: well appearing, NAD HEENT: MMM, NCAT, PERRL CV: RRR, no m/r/g. Pulm: CTAB, normal WOB, on RA Abd: Soft, NTND, +BS Ext: trace mitting edema b/l to mid-calf  Brief Hospital Course:   Dyspnea 2/2 CHF Exacerbation; History of HF with Reduced EF at 15%:  Patient presented with shortness of breath that began early morning on day of presentation. Patient reported of 3 day history of orthopnea and worsening LE swelling.Patient's daughter reports that she usually distributes his medications into a weekly pill box, however she did not this past week and is unsure if he has been taking his medications as prescribed. Per chart review, his ECHO in 2015 showed an EF of 15% (patient did not have ICD). BNP on admission was elevated at 946. Lung exam with bibasilar crackles, however patient was on room air with good oxygen saturations. CXR showed trace right pleural effusion. EKG with old LBBB and was unchanged; troponin was 0. He was diuresed with IV lasix and was eventually transitioned to his home PO lasix. Repeat ECHO showed EF 10-15% vs 20-25%. Cardiology was consulted and. Cardiology recommended OP f/u and their office  will call with an appointment on 4/3.  His symptoms resolved and was stable for discharge. Patient's Lipitor and coreg were continued during hospitalization. Enalopril was initially held for AKI and resumed on d/c.  PT was consulted and recommended home health PT and rolling walker which were arranged prior to discharge.  Abdominal Discomfort:  Patient reported of intermittent and self-resolving abdominal "swelling/bloating" for the past year. However the night prior to presentation, his symptoms did not resolve and worsened his dyspnea. Symptom worsened after eating a meal and improved somewhat with a bowel movement. He denied constipation/diarrhea. He reported on cholecystectomy and denied alcohol use. His liver function tests were unremarkable other than mildly elevated T. Bili. Abdominal US was obtained which showed no acute changes; his common bile duct was mildly dilated but per radiology this is common after a cholecystectomy. Hepatitis panel was obtained which was normal. His symptom resolved after IV diuresis. His symptoms are possibly due to gastroparesis in the setting of long standing diabetes  AKI:  Patient's creatinine was at baseline at 1.2 on admission. Creatinine increased with IV diuresis and stabilized. Lisinopril was initially held and resumed on discharge.  DMII with neuropathy/retinopathy: Repeat A1c was 8.9.  Due to low CBGs, lantus was decreased to 40 units daily during admission and continued on discharge.  Issues for Follow Up:  1. F/u fluid status - patient is to be scheduled for cardiology f/u 2. F/u Cr to ensure downtrending 3. F/u DM control and titrate lantus as indicated  Significant Procedures: none  Significant Labs and Imaging:   Recent Labs Lab 01/15/16 0827 01/15/16 2000 01/16/16 0435  WBC 7.5 6.0 6.0  HGB 14.6 14.7 14.2  HCT 42.4 43.2 41.8  PLT 178 175 176    Recent Labs Lab 01/15/16 0827 01/15/16 2000 01/16/16 0435  NA 143  --  140  K 4.4   --  3.9  CL 106  --  102  CO2 27  --  28  GLUCOSE 162*  --  215*  BUN 19  --  26*  CREATININE 1.24 1.43* 1.57*  CALCIUM 9.3  --  9.2  ALKPHOS  --  71 68  AST  --  22 19  ALT  --  18 16*  ALBUMIN  --  4.1 4.0   BNP 946.6 EKG with LBBB, unchanged from prior Trop 0.00  CXR:  FINDINGS: Chronic cardiomegaly appears stable. Other mediastinal contours are within normal limits. Upper limits of normal to hyperinflated lung volumes. No pneumothorax. Trace right pleural effusion. No consolidation. Basilar predominant increased interstitial markings are stable. Pulmonary vascularity appears within normal limits, no acute edema. No acute osseous abnormality identified. Flowing osteophytes in the thoracic spine.  IMPRESSION: Trace right pleural effusion. Otherwise stable chest with chronic cardiomegaly and chronic interstitial markings at the bases which might reflect scarring.   Results/Tests Pending at Time of Discharge: none  Discharge Medications:    Medication List    TAKE these medications        alfuzosin 10 MG 24 hr tablet  Commonly known as:  UROXATRAL  Take 10 mg by mouth daily.     atorvastatin 40 MG tablet  Commonly known as:  LIPITOR  Take 40 mg by mouth at bedtime.     bisacodyl 5 MG EC tablet  Commonly known as:  DULCOLAX  Take 1 tablet (5 mg total) by mouth daily as needed for moderate constipation.     carvedilol 3.125 MG tablet  Commonly known as:  COREG  Take 3.125 mg by mouth 2 (two) times daily with a meal.     clopidogrel 75 MG tablet  Commonly known as:  PLAVIX  Take 75 mg by mouth daily.     enalapril 2.5 MG tablet  Commonly known as:  VASOTEC  Take 2.5 mg by mouth daily.     famotidine 20 MG tablet  Commonly known as:  PEPCID  Take 1 tablet (20 mg total) by mouth 2 (two) times daily.     furosemide 80 MG tablet  Commonly known as:  LASIX  Take 80-160 mg by mouth 2 (two) times daily. Take  by mouth in the morning and take  by  mouth at lunch.     gabapentin 300 MG capsule  Commonly known as:  NEURONTIN  Take 300 mg by mouth 3 (three) times daily.     insulin aspart 100 UNIT/ML injection  Commonly known as:  novoLOG  Inject 20-32 Units into the skin 3 (three) times daily with meals. Use 20 units every morning, use 28-32 units at lunch and 28-32 units every evening     insulin glargine 100 UNIT/ML injection  Commonly known as:  LANTUS  Inject 0.4 mLs (40 Units total) into the skin every morning.     metoCLOPramide 5 MG tablet  Commonly known as:  REGLAN  Take 5 mg by mouth 4 (four) times daily -  before meals and at bedtime.     polyethylene glycol powder powder  Commonly known as:  GLYCOLAX/MIRALAX  Take 127.5 g by mouth daily as needed for mild constipation.  Discharge Instructions: Please refer to Patient Instructions section of EMR for full details.  Patient was counseled important signs and symptoms that should prompt return to medical care, changes in medications, dietary instructions, activity restrictions, and follow up appointments.   Follow-Up Appointments: Follow-up Information    Follow up with [name], MD. Schedule an appointment as soon as possible for a visit in 1 week.   Specialty:  Family Medicine   Why:  For hospital follow-up   Contact information:   [address] [place] [address] [phone]       Follow up with [facility].   Why:  Cardiologist will call you with appointment for follow-up on 01/19/16.  Please call the office if they do not contact you.   Contact information:   [address] [place] [address]       Erasmo Downer, MD 01/17/2016, 1:19 PM PGY-2, [facility]

## 2016-01-16 NOTE — Evaluation (Signed)
Physical Therapy Evaluation Patient Details Name: Stanley Farmer MRN: [id number] DOB: [birth year] Today's Date: 01/16/2016   History of Present Illness  75 Y/O M with PMX of HTN, DM2, CHF, CKD present with few days hx of SOB which worsened today. He coughs on and off. SOB is worse with lying flat in bed although he uses 2 pillows to sleep at baseline. He endorsed worsening leg swelling in the last few days. Denies chest pain, no fever. No sick contact. He is compliant with all his home meds.   Clinical Impression  Pt presents with general deconditioning, SOB, and decreased balance. Pt has a h/o bilateral Knee pain due to arthritis affecting his mobility. Pt was Independent prior to admission using a cane at times. Pt required min assist without an assistive device and a fall risk. Pt was much safer using a RW and pt/wife agreeable to use it when d/c home. Recommend D/C home when medically stable, HHPT, order a RW, and supervision with gait. Pt will benefit from skilled PT to maximize mobility and independence until d/c home with spouse.    Follow Up Recommendations Home health PT    Equipment Recommendations  Rolling walker with 5" wheels    Recommendations for Other Services       Precautions / Restrictions Precautions Precautions: Fall Restrictions Weight Bearing Restrictions: No      Mobility  Bed Mobility Overal bed mobility: Independent                Transfers Overall transfer level: Modified independent Equipment used: Rolling walker (2 wheeled)             General transfer comment: visual and demonstration cues used to reinforce hand placement during sit to stand transfers.  Ambulation/Gait Ambulation/Gait assistance: Min guard Ambulation Distance (Feet): 200 Feet Assistive device: Rolling walker (2 wheeled) Gait Pattern/deviations: Step-through pattern;Trunk flexed Gait velocity: decreased Gait velocity interpretation: Below normal speed for  age/gender General Gait Details: pt ambulated with no assistive device min assistance with general decreased balance and decreased fluidity of movement. Pt was much safer using the RW. Pt has a h/o knee pain and weakness making walking more difficult.  Stairs            Wheelchair Mobility    Modified Rankin (Stroke Patients Only)       Balance Overall balance assessment: Needs assistance Sitting-balance support: No upper extremity supported Sitting balance-Leahy Scale: Good     Standing balance support: No upper extremity supported Standing balance-Leahy Scale: Fair                               Pertinent Vitals/Pain Pain Assessment: Faces    Home Living Family/patient expects to be discharged to:: Private residence Living Arrangements: Spouse/significant other Available Help at Discharge: Family Type of Home: Mobile home Home Access: Stairs to enter Entrance Stairs-Rails: Can reach both Entrance Stairs-Number of Steps: 4 Home Layout: One level Home Equipment: Cane - single point      Prior Function Level of Independence: Independent with assistive device(s)               Hand Dominance        Extremity/Trunk Assessment   Upper Extremity Assessment: Defer to OT evaluation           Lower Extremity Assessment: Overall WFL for tasks assessed         Communication   Communication: Interpreter utilized  Cognition Arousal/Alertness: Awake/alert Behavior During Therapy: WFL for tasks assessed/performed Overall Cognitive Status: Difficult to assess                      General Comments      Exercises        Assessment/Plan    PT Assessment Patient needs continued PT services  PT Diagnosis Difficulty walking   PT Problem List Decreased activity tolerance;Decreased balance;Decreased mobility;Decreased knowledge of use of DME;Decreased strength  PT Treatment Interventions DME instruction;Gait training;Stair  training;Therapeutic activities;Therapeutic exercise;Balance training;Patient/family education   PT Goals (Current goals can be found in the Care Plan section) Acute Rehab PT Goals Patient Stated Goal: to walk PT Goal Formulation: With patient/family Time For Goal Achievement: 01/30/16 Potential to Achieve Goals: Good    Frequency Min 3X/week   Barriers to discharge        Co-evaluation               End of Session Equipment Utilized During Treatment: Gait belt Activity Tolerance: Patient tolerated treatment well Patient left: in bed;with call bell/phone within reach;with family/visitor present;Other (comment) (dr. in the room) Nurse Communication: Mobility status         Time: [phone] PT Time Calculation (min) (ACUTE ONLY): 40 min   Charges:   PT Evaluation $PT Eval Moderate Complexity: 1 Procedure PT Treatments $Gait Training: 8-22 mins $Therapeutic Activity: 8-22 mins   PT G Codes:        Greggory Stallion 01/16/2016, 8:50 AM

## 2016-01-17 DIAGNOSIS — I10 Essential (primary) hypertension: Secondary | ICD-10-CM

## 2016-01-17 LAB — HEMOGLOBIN A1C
Hgb A1c MFr Bld: 8.9 % — ABNORMAL HIGH (ref 4.8–5.6)
Mean Plasma Glucose: 209 mg/dL

## 2016-01-17 LAB — GLUCOSE, CAPILLARY
GLUCOSE-CAPILLARY: 169 mg/dL — AB (ref 65–99)
Glucose-Capillary: 153 mg/dL — ABNORMAL HIGH (ref 65–99)

## 2016-01-17 LAB — HEPATITIS PANEL, ACUTE
HEP A IGM: NEGATIVE
HEP B S AG: NEGATIVE
Hep B C IgM: NEGATIVE

## 2016-01-17 MED ORDER — INSULIN GLARGINE 100 UNIT/ML ~~LOC~~ SOLN: 40.0000 [IU] | Freq: Every morning | SUBCUTANEOUS | Status: DC

## 2016-01-17 MED ORDER — CARVEDILOL 3.125 MG PO TABS
3.1250 mg | ORAL_TABLET | Freq: Two times a day (BID) | ORAL | Status: DC
  Administered 2016-01-17: 3.125 mg via ORAL
  Filled 2016-01-17: qty 1

## 2016-01-17 NOTE — Care Management Note (Signed)
Case Management Note  Patient Details  Name: Stanley Farmer MRN: [id number] Date of Birth: 1941/07/25  Subjective/Objective:                  SOB, CHF exacerbation   Action/Plan: Cm spoke to patient and daughter at the bedside. Son put on speaker phone. CM spoke to family about discharge planning and advised that HH Pt not covered with Medicaid as verified with Fara Chute with [facility]. Family agreeable to outpatient PT so CM [facility] and faxed order with confirmation. CM placed information on discharge AVS. Patient felt walker would be helpful so CM arranged RW from [facility], Trey Paula. Patient lives at home with spouse and all decline further CM needs at this time. Address confirmed to be [address] [place] and additional phone number [phone].   Expected Discharge Date:  01/17/16               Expected Discharge Plan:  Home/Self Care  In-House Referral:     Discharge planning Services  CM Consult  Post Acute Care Choice:    Choice offered to:  Patient, Adult Children  DME Arranged:  Walker rolling DME Agency:  [facility]  HH Arranged:    [facility] Agency:     Status of Service:  Completed, signed off  Medicare Important Message Given:    Date Medicare IM Given:    Medicare IM give by:    Date Additional Medicare IM Given:    Additional Medicare Important Message give by:     If discussed at Long Length of Stay Meetings, dates discussed:    Additional Comments:  Darcel Smalling, RN 01/17/2016, 3:28 PM

## 2016-01-17 NOTE — Discharge Instructions (Signed)
Lantus dose decreased to 40 units daily.  All other medications remain the same.  Insuficiencia cardaca (Heart Failure) La insuficiencia cardaca es una afeccin en la que el corazn tiene dificultad para bombear la [place]. El corazn no bombea sangre de [place] eficiente para que el organismo pueda funcionar bien. En algunos casos de insuficiencia cardaca, los lquidos vuelven a los pulmones, o puede haber hinchazn (edema) en la zona inferior de las piernas. La insuficiencia cardaca por lo general es una enfermedad de larga duracin (crnica). Es importante que se cuide mucho y que siga el plan de tratamiento que le indique su mdico. CAUSAS  Algunas enfermedades y condiciones pueden causar insuficiencia cardaca. Estas incluyen lo siguiente:  Presin arterial elevada (hipertensin). La hipertensin hace que el msculo cardaco trabaje ms de lo normal. Cuando la presin en los vasos sanguneos es alta, el corazn tiene que bombear (contraerse) con ms fuerza para Insurance account manager la sangre por todo el cuerpo. La hipertensin arterial hace que con el tiempo el corazn se vuelva rgido y dbil.  Enfermedad arterial coronaria (EAC). La enfermedad arterial coronaria es la acumulacin de colesterol y grasa (placa) en las arterias del corazn. La obstruccin de las arterias priva al msculo cardaco de sangre y oxgeno. Esto ocasiona dolor en el pecho y puede conducir a un infarto. La hipertensin arterial tambin favorece la enfermedad arterial coronaria.  Ataque cardaco (infarto de miocardio). El ataque cardaco se produce cuando se obstruye una o ms arterias del corazn. La prdida de oxgeno daa el tejido muscular cardaco. Cuando esto ocurre, una parte del msculo cardaco muere. El tejido daado no se contrae bien y Scientist, research (life sciences) la capacidad del corazn para Insurance account manager.  Vlvulas cardacas anormales. Cuando las vlvulas cardacas no se abren y cierran como corresponde, pueden causar insuficiencia  cardaca. Esto hace que el msculo cardaco tenga que bombear con ms intensidad para que la [place].  Enfermedad del msculo cardaco (miocardiopata o miocarditis). En esta enfermedad, el msculo cardaco est daado por diversas causas. Entre ellas se encuentran el consumo de alcohol o drogas, las infecciones o pueden ser causas desconocidas. Todas estas causas aumentan el riesgo de insuficiencia cardaca.  Enfermedad pulmonar. Enfermedad pulmonar que hace que el corazn se esfuerce ms porque los pulmones no funcionan correctamente. Esto puede hacer que el corazn se tensione, y causar insuficiencia.  Diabetes. La diabetes aumenta el riesgo de insuficiencia cardaca. El nivel elevado de azcar en la sangre contribuye a aumentar los [place] de grasas (lpidos) en la [place]. La diabetes tambin favorece que lentamente se daen los pequeos vasos sanguneos que transportan nutrientes importantes al el msculo cardaco. Cuando el corazn no obtiene oxgeno y alimento suficiente, se debilita y se torna rgido. Por lo tanto no se contrae de Firefighter.  Hay otras enfermedades y condiciones que pueden contribuir a la insuficiencia cardaca. Entre ellas se incluyen el ritmo cardaco anormal, los problemas de tiroides y los recuentos bajos de glbulos rojos (anemia). Determinadas conductas perjudiciales aumentan el riesgo de insuficiencia cardaca, por ejemplo:  Tener sobrepeso.  Fumar o mascar tabaco.  Consumir alimentos ricos en grasas y colesterol.  Abusar de las drogas ilcitas o del alcohol.  La falta de actividad fsica. SNTOMAS  Los sntomas pueden variar y ser difciles de Engineer, manufacturing. Los sntomas pueden incluir:  Falta de aire al realizar actividades como subir escaleras.  Tos persistente.  Hinchazn de los pies, tobillos, piernas o abdomen.  Aumento de peso sin motivo.  Dificultad para respirar al estar acostado (ortopnea).  Despertarse con la necesidad de sentarse y  tomar aire.  Latidos cardacos rpidos.  Fatiga y prdida de Engineer, drilling.  Mareos, o sensacin de desmayo o desvanecimiento.  Prdida del apetito.  Nuseas.  Orina con ms frecuencia durante la noche (nocturia). DIAGNSTICO  El diagnstico se realiza segn la historia clnica, los sntomas, el examen fsico y las pruebas diagnsticas. Las pruebas diagnsticas son:  Science writer.  Electrocardiograma.  Radiografa de trax.  Anlisis de [place].  Prueba de esfuerzo.  Angiografa cardaca.  Gammagrafa. TRATAMIENTO  El tratamiento est destinado al control de los sntomas. Podr ser necesario que tome medicamentos, realice cambios en su estilo de vida o se someta a una intervencin [place] para tratar la insuficiencia cardaca.  Los medicamentos para el tratamiento de la insuficiencia cardaca son:  Inhibidores de la enzima convertidora de angiotensina (IECA). Este tipo de Automatic Data bloquea los efectos de una protena llamada enzima convertidora de angiotensina. Los inhibidores de la ECA Banker (dilatan) los vasos sanguneos y ayudan a Technical sales engineer la presin arterial.  Bloqueadores de los receptores de angiotensina [place]). Este tipo de medicamento bloquea las acciones de una protena de la sangre llamada angiotensina. Los bloqueadores de los receptores de angiotensina dilatan los vasos sanguneos y [place] a reducir la presin arterial.  Medicamentos para eliminar los lquidos (diurticos). Los diurticos [employer] los riones eliminen la sal y el agua de la [place]. El lquido en exceso es eliminado con la [place]. Esta eliminacin del exceso de lquido disminuye el volumen de sangre que el corazn bombea.  Betabloqueantes. Los betabloqueantes evitan que el corazn palpite demasiado rpido y mejoran la fuerza del msculo cardaco.  Digitlicos. Aumentan la fuerza de los latidos cardacos.  Los [employer] un estilo de vida saludable incluyen:  Barista y Pharmacologist un peso  saludable.  Dejar de fumar o mascar tabaco.  Consumir alimentos cardiosaludables.  Limitar o evitar el alcohol.  Dejar de consumir drogas ilcitas.  Hacer actividad fsica segn las indicaciones de su mdico.  Los tratamientos quirrgicos para la insuficiencia cardaca pueden ser:  Un procedimiento para abrir las arterias obstruidas, reparacin de vlvulas cardacas daadas o la extirpacin del tejido muscular cardaco daado.  La colocacin de un marcapasos para ayudar al funcionamiento del msculo cardaco y para controlar ciertos ritmos cardacos anormales.  Un desfibrilador cardioversor interno para tratar determinados ritmos cardacos graves anormales.  Un dispositivo de asistencia ventricular izquierda (DAVI) para ayudar a que el corazn [employer]. INSTRUCCIONES PARA EL CUIDADO EN EL HOGAR   Tome los medicamentos solamente como se lo haya indicado el mdico. Los medicamentos son importantes para reducir la carga de trabajo del corazn, disminuir la progresin de la insuficiencia cardaca y Scientist, clinical (histocompatibility and immunogenetics) los sntomas.  No deje de tomar los medicamentos, excepto si se lo indica su mdico.  No se saltee ninguna dosis de los medicamentos.  Pida una nueva receta antes de quedarse sin medicamentos. Es necesario que tome sus medicamentos todos [place].  Haga actividad fsica moderada si se lo indica su mdico. La actividad fsica moderada puede beneficiar a International aid/development worker. Los ancianos y las personas con insuficiencia cardaca grave deben consultarle a su mdico qu actividades fsicas son recomendables para ellos.  Consuma alimentos cardiosaludables. Los alimentos no deben incluir grasas trans y deben tener bajo contenido de grasas saturadas, colesterol y sal (sodio). Las opciones saludables incluyen frutas frescas o congeladas y verduras, pescado, carnes magras, legumbres, productos lcteos descremados o bajos en grasa y cereales integrales o alimentos con alto contenido de [place].  Hable con un nutricionista para aprender ms sobre los alimentos cardiosaludables.  Limite el sodio si se lo indica su mdico. La restriccin de sodio puede reducir los sntomas de insuficiencia cardaca en [employer]. Hable con un nutricionista para aprender ms sobre los condimentos cardiosaludables.  Use mtodos de coccin saludables. Los mtodos de coccin saludables incluyen asar, Software engineer, hervir, Development worker, community, cocer al vapor o Actor. Hable con un nutricionista para aprender ms acerca de los mtodos de coccin saludables.  Limite los lquidos si se lo indica su mdico. La restriccin de lquidos puede reducir los sntomas de insuficiencia cardaca en [employer].  Controle su peso a diario. Es importante que se pese todos los [address] para reconocer anticipadamente cuando hay exceso de lquido. Hgalo todas las maanas despus de orinar y antes de Engineer, maintenance. Use la misma ropa cada vez que se pese. Anote su [employer]. Lleve a su mdico el registro de [address].  Controle y registre su presin arterial si se lo indica su mdico.  Contrlese el pulso si se lo indica su mdico.  Pierda peso si se lo indica su mdico. La prdida de peso puede reducir los sntomas de insuficiencia cardaca en International aid/development worker.  Deje de fumar o mascar tabaco. La nicotina hace que el corazn trabaje ms y [employer] vasos sanguneos. No utilice chicles ni parches de nicotina antes de hablar con su mdico.  Concurra a todas las visitas de control como se lo haya indicado el mdico. Esto es importante.  Limite el consumo de alcohol a no ms de 1 medida por da en las mujeres no embarazadas y 2 medidas en los hombres. Una medida equivale a 12onzas de cerveza, 5onzas de vino o 1onzas de bebidas alcohlicas de alta graduacin. Beber ms puede ser daino para el corazn. Informe a su mdico si bebe alcohol varias veces a la semana. Hable con su mdico acerca de si el alcohol es seguro para usted. Si el  corazn ya ha sufrido un dao por el alcohol o sufre una insuficiencia cardaca grave, debe dejar de consumirlo completamente.  Deje de consumir drogas ilcitas.  Mantngase al da con las vacunas. Esto es especialmente importante prevenir las infecciones respiratorias con vacunas actualizadas contra el neumococo y la gripe.  Controle otros problemas de [place], como hipertensin, diabetes, enfermedad de la tiroides o ritmos cardacos anormales segn las indicaciones de su mdico.  Aprenda a Dealer.  Planifique perodos de descanso cuando se sienta fatigado.  Aprenda estrategias para manejar las altas temperaturas. Si el clima es extremadamente caluroso:  Evite la actividad fsica intensa.  Use el aire acondicionado o los ventiladores o busque un lugar ms fresco.  Evite la cafena y el alcohol.  Use ropa holgada, ligera y de colores claros.  Aprenda estrategias para manejar el fro. Si el clima es extremadamente fro:  Evite la actividad fsica intensa.  Vstase con varias capas de ropa.  Use mitones o guantes, un sombrero y [place] bufanda cuando salga.  Evite el alcohol.  Reciba asesoramiento y apoyo si lo necesita.  Busque programas de rehabilitacin y participe en ellos para mantener o mejorar su independencia y su calidad de vida. SOLICITE ATENCIN MDICA SI:   Aumenta de peso rpidamente.  Nota que le falta el aire y esto no es habitual en usted.  No puede participar en sus actividades fsicas habituales.  Se cansa con facilidad.  Tose ms de lo normal, especialmente al realizar actividad fsica.  Observa que sus manos,  pies, tobillos o abdomen se le hinchan o estn ms hinchados que lo habitual.  Le cuesta dormir debido a que Engineer, manufacturing systems.  Siente que el corazn late rpido (palpitaciones).  Siente mareos o sensacin de desvanecimiento al ponerse de pie. SOLICITE ATENCIN MDICA DE INMEDIATO SI:   Tiene dificultad para  respirar.  Hay una modificacin en el estado mental, como disminucin de la lucidez o dificultad para concentrarse.  Siente dolor o International aid/development worker.  Se desmaya una vez (sncope). ASEGRESE DE QUE:   Comprende estas instrucciones.  Controlar su afeccin.  Recibir ayuda de inmediato si no mejora o si empeora.   Esta informacin no tiene Theme park manager el consejo del mdico. Asegrese de hacerle al mdico cualquier pregunta que tenga.   Document Released: 10/04/2005 Document Revised: 02/18/2015 Elsevier Interactive Patient Education [employer]

## 2016-01-17 NOTE — Discharge Summary (Signed)
Pt got discharged to home, discharge instructions provided and patient showed understanding to it, IV taken out,Telemonitor DC,pt left unit in wheelchair with all of the belongings accompanied with a family member (Daughter) 

## 2016-01-18 ENCOUNTER — Emergency Department (HOSPITAL_COMMUNITY)
Admission: EM | Admit: 2016-01-18 | Discharge: 2016-01-19 | Disposition: A | Discharge status: Home / Self Care | Payer: Medicare Other | Attending: Emergency Medicine | Admitting: Emergency Medicine

## 2016-01-18 ENCOUNTER — Encounter (HOSPITAL_COMMUNITY): Payer: Self-pay

## 2016-01-18 DIAGNOSIS — K5900 Constipation, unspecified: Secondary | ICD-10-CM

## 2016-01-18 DIAGNOSIS — I10 Essential (primary) hypertension: Secondary | ICD-10-CM | POA: Diagnosis not present

## 2016-01-18 DIAGNOSIS — I509 Heart failure, unspecified: Secondary | ICD-10-CM | POA: Diagnosis not present

## 2016-01-18 DIAGNOSIS — R140 Abdominal distension (gaseous): Secondary | ICD-10-CM

## 2016-01-18 DIAGNOSIS — R600 Localized edema: Secondary | ICD-10-CM | POA: Diagnosis not present

## 2016-01-18 DIAGNOSIS — Z79899 Other long term (current) drug therapy: Secondary | ICD-10-CM | POA: Insufficient documentation

## 2016-01-18 DIAGNOSIS — E119 Type 2 diabetes mellitus without complications: Secondary | ICD-10-CM | POA: Insufficient documentation

## 2016-01-18 DIAGNOSIS — Z794 Long term (current) use of insulin: Secondary | ICD-10-CM | POA: Diagnosis not present

## 2016-01-18 DIAGNOSIS — Z87891 Personal history of nicotine dependence: Secondary | ICD-10-CM | POA: Insufficient documentation

## 2016-01-18 LAB — COMPREHENSIVE METABOLIC PANEL
ALK PHOS: 69 U/L (ref 38–126)
ALT: 33 U/L (ref 17–63)
ANION GAP: 11 (ref 5–15)
AST: 40 U/L (ref 15–41)
Albumin: 4 g/dL (ref 3.5–5.0)
BUN: 33 mg/dL — ABNORMAL HIGH (ref 6–20)
CHLORIDE: 102 mmol/L (ref 101–111)
CO2: 28 mmol/L (ref 22–32)
Calcium: 9.6 mg/dL (ref 8.9–10.3)
Creatinine, Ser: 1.58 mg/dL — ABNORMAL HIGH (ref 0.61–1.24)
GFR, EST AFRICAN AMERICAN: 48 mL/min — AB (ref >60)
GFR, EST NON AFRICAN AMERICAN: 41 mL/min — AB (ref >60)
Glucose, Bld: 107 mg/dL — ABNORMAL HIGH (ref 65–99)
Potassium: 4.1 mmol/L (ref 3.5–5.1)
SODIUM: 141 mmol/L (ref 135–145)
Total Bilirubin: 1.2 mg/dL (ref 0.3–1.2)
Total Protein: 7.6 g/dL (ref 6.5–8.1)

## 2016-01-18 LAB — URINALYSIS, ROUTINE W REFLEX MICROSCOPIC
Bilirubin Urine: NEGATIVE
GLUCOSE, UA: NEGATIVE mg/dL
Ketones, ur: NEGATIVE mg/dL
LEUKOCYTES UA: NEGATIVE
NITRITE: NEGATIVE
PH: 6 (ref 5.0–8.0)
PROTEIN: NEGATIVE mg/dL
Specific Gravity, Urine: 1.012 (ref 1.005–1.030)

## 2016-01-18 LAB — CBC
HEMATOCRIT: 41.5 % (ref 39.0–52.0)
HEMOGLOBIN: 14 g/dL (ref 13.0–17.0)
MCH: 30.4 pg (ref 26.0–34.0)
MCHC: 33.7 g/dL (ref 30.0–36.0)
MCV: 90.2 fL (ref 78.0–100.0)
Platelets: 190 10*3/uL (ref 150–400)
RBC: 4.6 MIL/uL (ref 4.22–5.81)
RDW: 13.5 % (ref 11.5–15.5)
WBC: 5.4 10*3/uL (ref 4.0–10.5)

## 2016-01-18 LAB — URINE MICROSCOPIC-ADD ON: WBC UA: NONE SEEN WBC/hpf (ref 0–5)

## 2016-01-18 LAB — LIPASE, BLOOD: LIPASE: 30 U/L (ref 11–51)

## 2016-01-18 NOTE — ED Notes (Signed)
Pt reports RUQ abdominal pain, describes it as a bloating feeling. He denies N/V/D. Abdomen appears distended and firm. LBM: today.

## 2016-01-18 NOTE — ED Provider Notes (Signed)
CSN: [id number]     Arrival date & time 01/18/16  2232 History   By signing my name below, I, Arlan Organ, attest that this documentation has been prepared under the direction and in the presence of Shon Baton, MD.  Electronically Signed: Arlan Organ, ED Scribe. 01/19/2016. 12:38 AM.   Chief Complaint  Patient presents with  . Abdominal Pain   The history is provided by the patient. No language interpreter was used.    HPI Comments: Stanley Farmer is a 75 y.o. male with a PMHx of HTN, DM, and CHF who presents to the Emergency Department complaining of constant, ongoing abdominal bloating onset this afternoon after eating. Pt states this is typical for him with each meal. However, current episode did not resolve after a period of time as it typically does. No aggravating or alleviating factors reported. Pt denies any pain at this time. No OTC medications or home remedies attempted prior to arrival. No recent fever, chills, nausea, vomiting, or diarrhea. Pt denies any recent high or low blood sugar levels. Pt was recently discharged from the hospital on 4/1 after being admitted for abdominal distention And heart failure.  PCP: [name], MD    Past Medical History  Diagnosis Date  . Hypertension   . Diabetes mellitus without complication (HCC)   . CHF (congestive heart failure) [facility])    Past Surgical History  Procedure Laterality Date  . Kidney stone surgery     Family History  Problem Relation Age of Onset  . Diabetes Mother    Social History  Substance Use Topics  . Smoking status: Former Games developer  . Smokeless tobacco: None  . Alcohol Use: No    Review of Systems  Constitutional: Negative for fever and chills.  Respiratory: Negative for shortness of breath.   Cardiovascular: Negative for chest pain.  Gastrointestinal: Positive for abdominal distention. Negative for nausea, vomiting, abdominal pain and diarrhea.  Neurological: Negative for headaches.   Psychiatric/Behavioral: Negative for confusion.  All other systems reviewed and are negative.     Allergies  Review of patient's allergies indicates no known allergies.  Home Medications   Prior to Admission medications   Medication Sig Start Date End Date Taking? Authorizing Provider  alfuzosin (UROXATRAL) 10 MG 24 hr tablet Take 10 mg by mouth daily.   Yes Historical Provider, MD  atorvastatin (LIPITOR) 40 MG tablet Take 40 mg by mouth at bedtime.    Yes Historical Provider, MD  bisacodyl (DULCOLAX) 5 MG EC tablet Take 1 tablet (5 mg total) by mouth daily as needed for moderate constipation. Patient taking differently: Take 5 mg by mouth 2 (two) times daily.  04/28/14  Yes Trixie Dredge, PA-C  carvedilol (COREG) 3.125 MG tablet Take 3.125 mg by mouth 2 (two) times daily with a meal.   Yes Historical Provider, MD  clopidogrel (PLAVIX) 75 MG tablet Take 75 mg by mouth daily.   Yes Historical Provider, MD  enalapril (VASOTEC) 2.5 MG tablet Take 2.5 mg by mouth daily.   Yes Historical Provider, MD  famotidine (PEPCID) 20 MG tablet Take 1 tablet (20 mg total) by mouth 2 (two) times daily. 08/03/15  Yes Rolan Bucco, MD  furosemide (LASIX) 80 MG tablet Take 80-160 mg by mouth 2 (two) times daily. Take 160mg  by mouth in the morning and take 80mg  by mouth at lunch.   Yes Historical Provider, MD  gabapentin (NEURONTIN) 300 MG capsule Take 300 mg by mouth 3 (three) times daily.   Yes  Historical Provider, MD  insulin aspart (NOVOLOG) 100 UNIT/ML injection Inject 20-32 Units into the skin 3 (three) times daily with meals. Use 20 units every morning, use 28-32 units at lunch and 28-32 units every evening   Yes Historical Provider, MD  metoCLOPramide (REGLAN) 5 MG tablet Take 5 mg by mouth 4 (four) times daily -  before meals and at bedtime.   Yes Historical Provider, MD  insulin glargine (LANTUS) 100 UNIT/ML injection Inject 0.4 mLs (40 Units total) into the skin every morning. Patient not taking:  Reported on 01/19/2016 01/17/16   Erasmo Downer, MD  polyethylene glycol powder [facility]) powder Take one capful by mouth daily until stools are loose 01/19/16   Shon Baton, MD   Triage Vitals: BP 117/64 mmHg  Pulse 66  Temp(Src) 98.2 F (36.8 C) (Oral)  Resp 17  SpO2 92%   Physical Exam  Constitutional: He is oriented to person, place, and time. He appears well-developed and well-nourished. No distress.  HENT:  Head: Normocephalic and atraumatic.  Cardiovascular: Normal rate, regular rhythm and normal heart sounds.   No murmur heard. Pulmonary/Chest: Effort normal and breath sounds normal. No respiratory distress. He has no wheezes.  Abdominal: Soft. Bowel sounds are normal. He exhibits distension. There is no tenderness. There is no rebound.  Mild distention  Musculoskeletal: He exhibits edema.  Trace bilateral lower extremity edema  Neurological: He is alert and oriented to person, place, and time.  Skin: Skin is warm and dry.  Psychiatric: He has a normal mood and affect.  Nursing note and vitals reviewed.   ED Course  Procedures (including critical care time)  DIAGNOSTIC STUDIES: Oxygen Saturation is 95% on RA, adequate by my interpretation.    COORDINATION OF CARE: 12:16 AM- Will order blood work and urinalysis. Discussed treatment plan with pt at bedside and pt agreed to plan.     Labs Review Labs Reviewed  COMPREHENSIVE METABOLIC PANEL - Abnormal; Notable for the following:    Glucose, Bld 107 (*)    BUN 33 (*)    Creatinine, Ser 1.58 (*)    GFR calc non Af Amer 41 (*)    GFR calc Af Amer 48 (*)    All other components within normal limits  URINALYSIS, ROUTINE W REFLEX MICROSCOPIC (NOT AT [facility]) - Abnormal; Notable for the following:    Hgb urine dipstick SMALL (*)    All other components within normal limits  BRAIN NATRIURETIC PEPTIDE - Abnormal; Notable for the following:    B Natriuretic Peptide 604.3 (*)    All other components within  normal limits  URINE MICROSCOPIC-ADD ON - Abnormal; Notable for the following:    Squamous Epithelial / LPF 0-5 (*)    Bacteria, UA RARE (*)    Casts HYALINE CASTS (*)    All other components within normal limits  LIPASE, BLOOD  CBC    Imaging Review Dg Abd Acute W/chest  01/19/2016  CLINICAL DATA:  Right upper quadrant abdominal pain EXAM: DG ABDOMEN ACUTE W/ 1V CHEST COMPARISON:  None. FINDINGS: Cardiac shadow is mildly enlarged. Right basilar infiltrate is noted. No sizable effusion is seen. Scattered large and small bowel gas is noted. Fecal material is noted throughout the colon consistent with a mild degree of constipation. Degenerative changes of lumbar spine are noted. No free air is seen. IMPRESSION: Mild constipation. Mild right basilar infiltrate. Electronically Signed   By: Alcide Clever M.D.   On: 01/19/2016 01:43   I have personally  reviewed and evaluated these images and lab results as part of my medical decision-making.   EKG Interpretation   Date/Time:  Monday January 19 2016 00:43:46 EDT Ventricular Rate:  65 PR Interval:  198 QRS Duration: 185 QT Interval:  522 QTC Calculation: 543 R Axis:   -68 Text Interpretation:  Sinus rhythm Left bundle branch block Confirmed by  HORTON  MD, COURTNEY ([id number]) on 01/19/2016 12:49:11 AM      MDM   Final diagnoses:  Abdominal distension (gaseous)  Constipation, unspecified constipation type    Patient presents with abdominal bloating. Denies abdominal pain. History of the same but worsened tonight. History of heart failure with recent admission. He does not appear volume overloaded on exam.  Lab work up is largely unremarkable. BNP is 600. On previous admission it was 900. Acute abdominal series shows persistent right-sided basilar infiltrate versus effusion. This is been present on prior EKGs. Patient denies any infectious symptoms. Will not treat at this time. Patient's x-ray does show evidence of constipation. Will place on  MiraLAX daily. No evidence of obstruction.  After history, exam, and medical workup I feel the patient has been appropriately medically screened and is safe for discharge home. Pertinent diagnoses were discussed with the patient. Patient was given return precautions.  I personally performed the services described in this documentation, which was scribed in my presence. The recorded information has been reviewed and is accurate.   Shon Baton, MD 01/19/16 [phone]

## 2016-01-19 ENCOUNTER — Emergency Department (HOSPITAL_COMMUNITY): Payer: Medicare Other

## 2016-01-19 LAB — BRAIN NATRIURETIC PEPTIDE: B Natriuretic Peptide: 604.3 pg/mL — ABNORMAL HIGH (ref 0.0–100.0)

## 2016-01-19 MED ORDER — POLYETHYLENE GLYCOL 3350 17 GM/SCOOP PO POWD: ORAL | Status: DC

## 2016-01-19 NOTE — ED Notes (Signed)
Patient transported to X-ray 

## 2016-01-19 NOTE — Discharge Instructions (Signed)
Estreimiento - Adultos (Constipation, Adult) Estreimiento significa que una persona tiene menos de tres evacuaciones en una semana, dificultad para defecar, o que las heces son secas, duras, o ms grandes que lo normal. A medida que envejecemos el estreimiento es ms comn. Una dieta baja en fibra, no tomar suficientes lquidos y el uso de ciertos medicamentos pueden empeorar el estreimiento.  CAUSAS   Ciertos medicamentos, como los antidepresivos, analgsicos, suplementos de hierro, anticidos y diurticos.  Algunas enfermedades, como la diabetes, el sndrome del colon irritable, enfermedad de la tiroides, o depresin.  No beber suficiente agua.  No consumir suficientes alimentos ricos en fibra.  Situaciones de estrs o viajes.  Falta de actividad fsica o de ejercicio.  Ignorar la necesidad sbita de defecar.  Uso en exceso de laxantes. SIGNOS Y SNTOMAS   Defecar menos de tres veces por semana.  Dificultad para defecar.  Tener las heces secas y duras, o ms grandes que las normales.  Sensacin de estar lleno o hinchado.  Dolor en la parte baja del abdomen.  No sentir alivio despus de defecar. DIAGNSTICO  El mdico le har una historia clnica y un examen fsico. Pueden hacerle exmenes adicionales para el estreimiento grave. Estos estudios pueden ser:  Un radiografa con enema de bario para examinar el recto, el colon y, en algunos casos, el intestino delgado.  Una sigmoidoscopia para examinar el colon inferior.  Una colonoscopia para examinar todo el colon. TRATAMIENTO  El tratamiento depender de la gravedad del estreimiento y de la causa. Algunos tratamientos nutricionales son beber ms lquidos y comer ms alimentos ricos en fibra. El cambio en el estilo de vida incluye hacer ejercicios de manera regular. Si estas recomendaciones para realizar cambios en la dieta y en el estilo de vida no ayudan, el mdico le puede indicar el uso de laxantes de venta libre  para ayudarlo a defecar. Los medicamentos recetados se pueden prescribir si los medicamentos de venta libre no lo ayudan.  INSTRUCCIONES PARA EL CUIDADO EN EL HOGAR   Consuma alimentos con alto contenido de fibra, como frutas, vegetales, cereales integrales y porotos.  Limite los alimentos procesados ricos en grasas y azcar, como las papas fritas, hamburguesas, galletas, dulces y refrescos.  Puede agregar un suplemento de fibra a su dieta si no obtiene lo suficiente de los alimentos.  Beba suficiente lquido para mantener la orina clara o de color amarillo plido.  Haga ejercicio regularmente o segn las indicaciones del mdico.  Vaya al bao cuando sienta la necesidad de ir. No se aguante las ganas.  Tome solo medicamentos de venta libre o recetados, segn las indicaciones del mdico. No tome otros medicamentos para el estreimiento sin consultarlo antes con su mdico. SOLICITE ATENCIN MDICA DE INMEDIATO SI:   Observa sangre brillante en las heces.  El estreimiento dura ms de 4 das o empeora.  Siente dolor abdominal o rectal.  Las heces son delgadas como un lpiz.  Pierde peso de manera inexplicable. ASEGRESE DE QUE:   Comprende estas instrucciones.  Controlar su afeccin.  Recibir ayuda de inmediato si no mejora o si empeora.   Esta informacin no tiene como fin reemplazar el consejo del mdico. Asegrese de hacerle al mdico cualquier pregunta que tenga.   Document Released: 10/24/2007 Document Revised: 10/25/2014 Elsevier Interactive Patient Education 2016 [employer]  

## 2016-01-19 NOTE — ED Notes (Signed)
Pt back from xray; urinal provided

## 2016-04-08 ENCOUNTER — Other Ambulatory Visit: Payer: Self-pay | Admitting: Internal Medicine

## 2016-04-12 ENCOUNTER — Telehealth (HOSPITAL_COMMUNITY): Payer: Self-pay | Admitting: Vascular Surgery

## 2016-04-12 NOTE — Telephone Encounter (Signed)
Left message w/ family member to call back to make appt w/ send letter

## 2016-04-14 ENCOUNTER — Encounter (HOSPITAL_COMMUNITY): Payer: Self-pay | Admitting: Vascular Surgery

## 2016-04-14 ENCOUNTER — Telehealth (HOSPITAL_COMMUNITY): Payer: Self-pay | Admitting: Vascular Surgery

## 2016-04-14 ENCOUNTER — Other Ambulatory Visit: Payer: Self-pay | Admitting: Internal Medicine

## 2016-04-14 NOTE — Telephone Encounter (Signed)
Called pt several times will send letter to make f/u appt

## 2016-04-14 NOTE — Telephone Encounter (Signed)
Called pt several times , will send letter

## 2016-05-03 ENCOUNTER — Other Ambulatory Visit: Payer: Self-pay | Admitting: Internal Medicine

## 2016-05-06 ENCOUNTER — Other Ambulatory Visit (HOSPITAL_COMMUNITY): Payer: Self-pay

## 2016-05-17 ENCOUNTER — Other Ambulatory Visit: Payer: Self-pay | Admitting: Internal Medicine

## 2016-07-15 ENCOUNTER — Other Ambulatory Visit: Payer: Self-pay | Admitting: Internal Medicine

## 2016-09-11 ENCOUNTER — Other Ambulatory Visit: Payer: Self-pay | Admitting: Internal Medicine

## 2017-02-17 ENCOUNTER — Other Ambulatory Visit (HOSPITAL_COMMUNITY): Payer: Self-pay | Admitting: Cardiology

## 2017-04-08 ENCOUNTER — Other Ambulatory Visit (HOSPITAL_COMMUNITY): Payer: Self-pay | Admitting: *Deleted

## 2017-05-07 IMAGING — DX DG CHEST 2V
2 series · 2 of 2 positions shown · non-contrast
Comparison: 08/03/2015 and earlier.

CLINICAL DATA: 74-year-old male with shortness of breath and chest
pain for 2 days. Initial encounter.

EXAM:
CHEST  2 VIEW

[w chest pa]
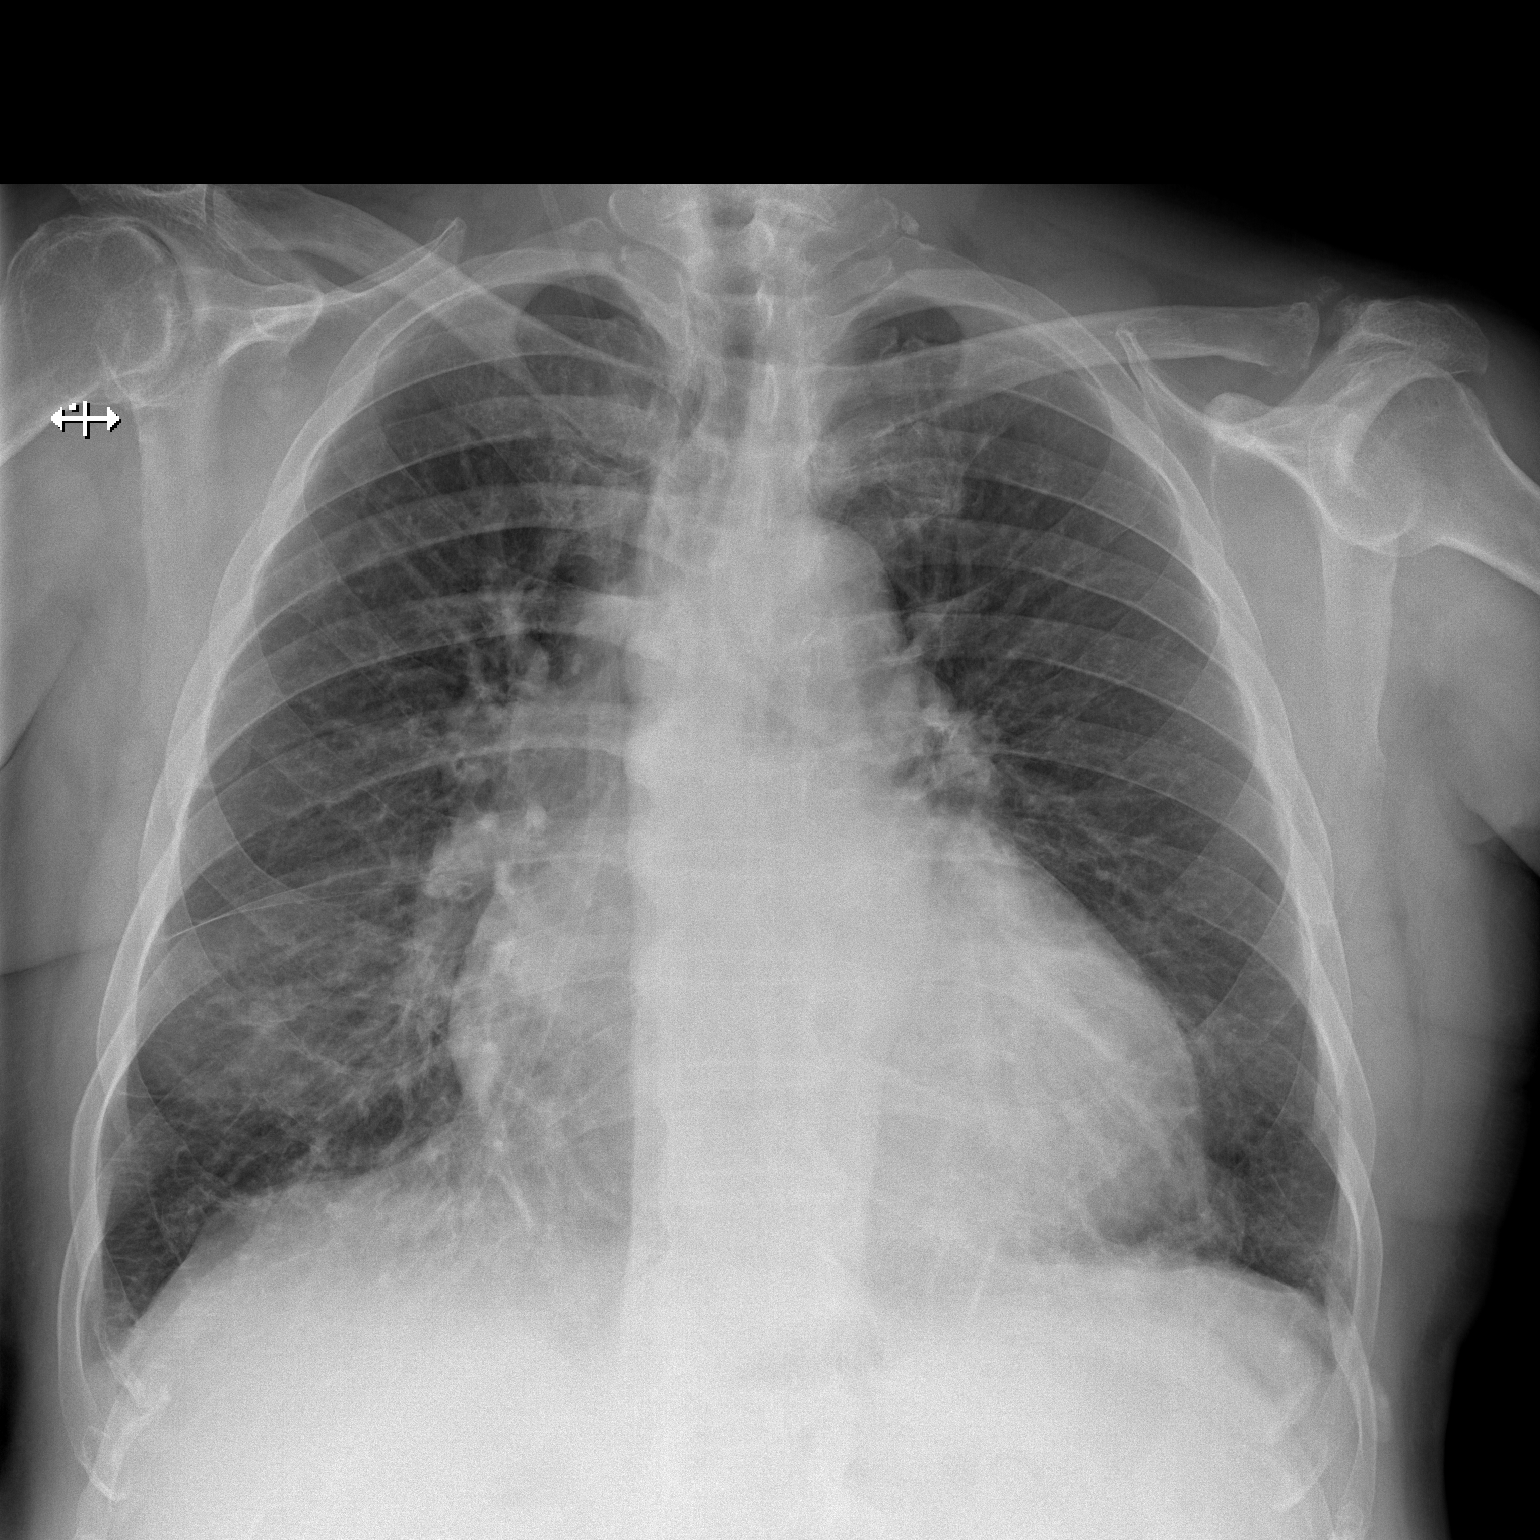

[w chest lat]
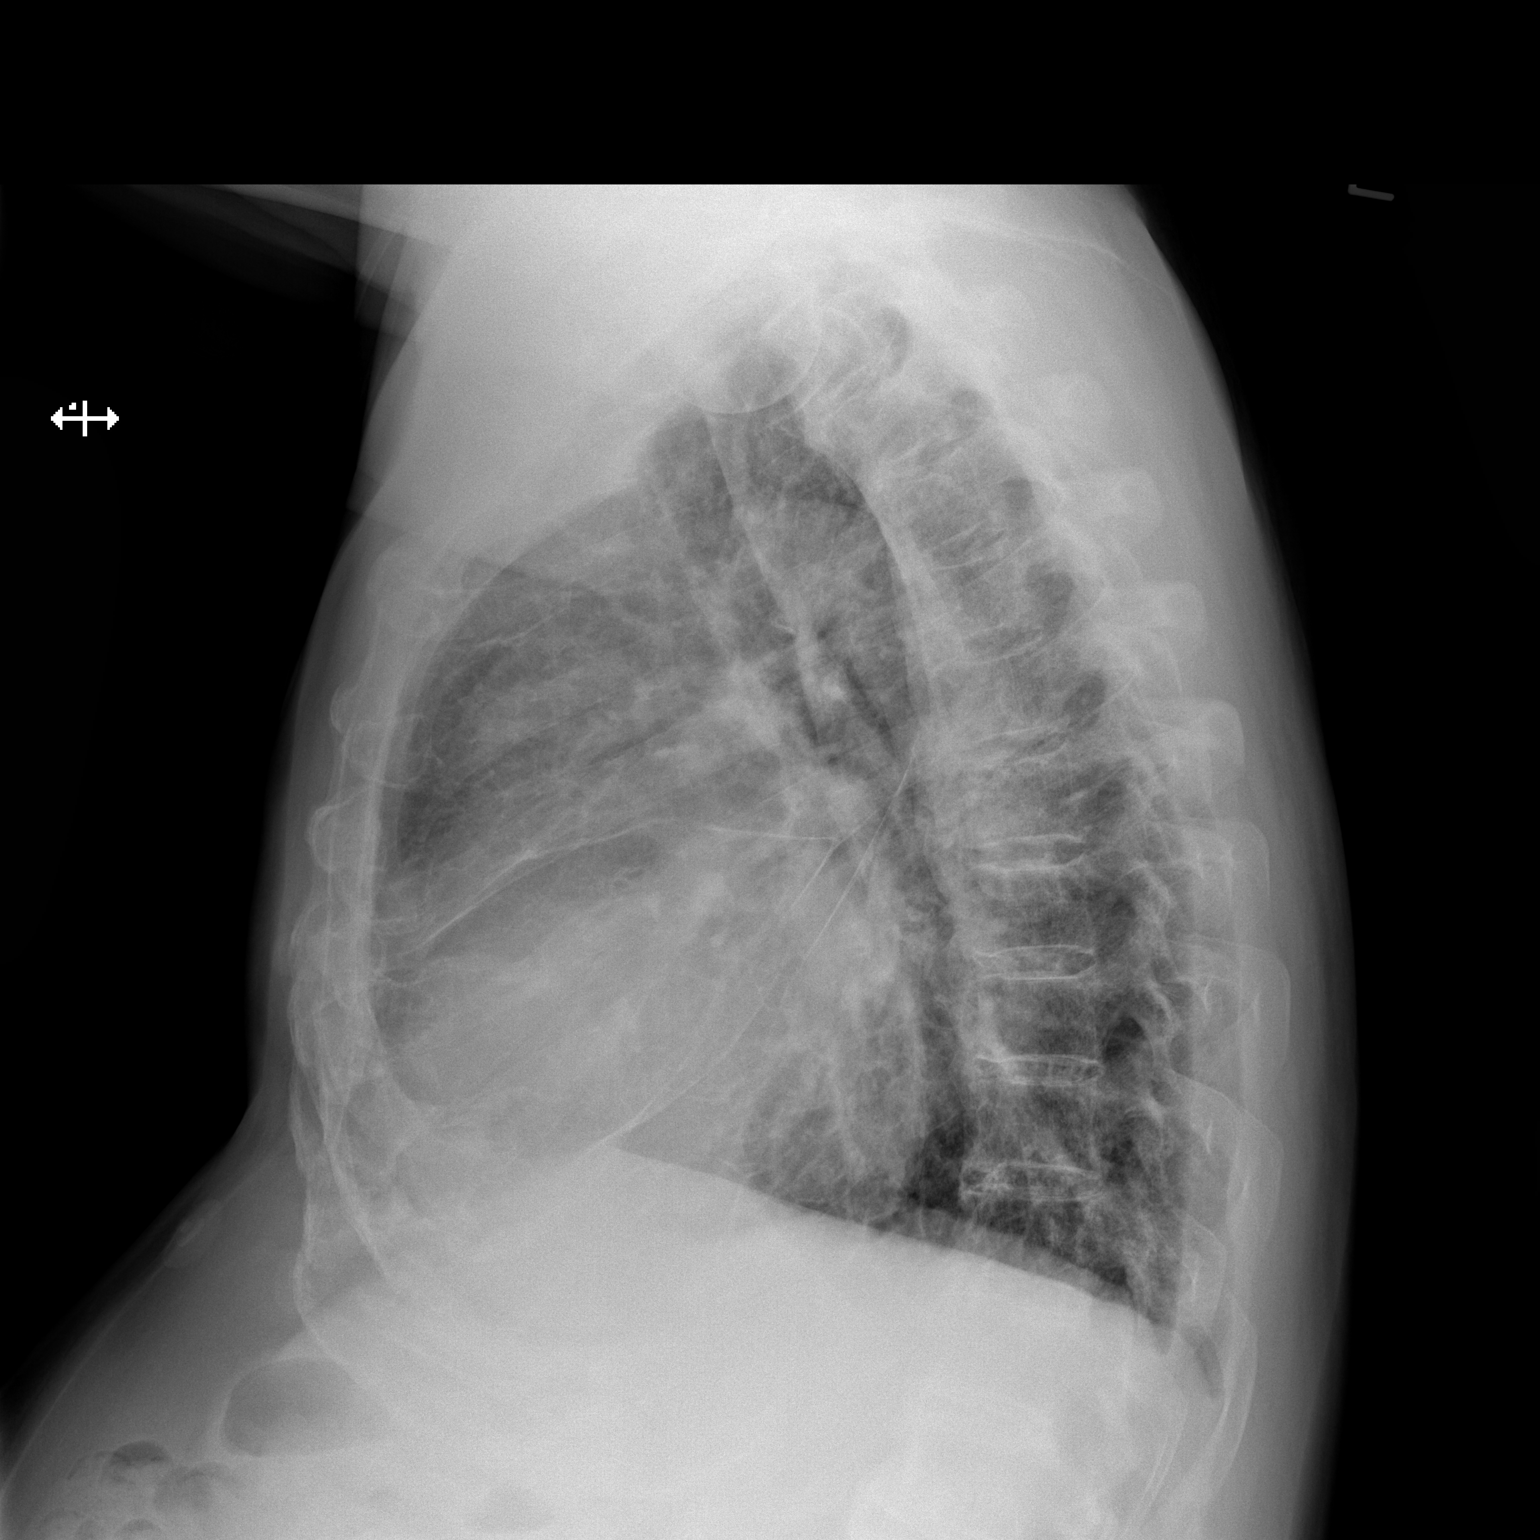

[2 of 2 positions shown; findings below may reference images not displayed]

FINDINGS: Chronic cardiomegaly appears stable. Other mediastinal contours are
within normal limits. Upper limits of normal to hyperinflated lung
volumes. No pneumothorax. Trace right pleural effusion. No
consolidation. Basilar predominant increased interstitial markings
are stable. Pulmonary vascularity appears within normal limits, no
acute edema. No acute osseous abnormality identified. Flowing
osteophytes in the thoracic spine.
IMPRESSION: Trace right pleural effusion. Otherwise stable chest with chronic
cardiomegaly and chronic interstitial markings at the bases which
might reflect scarring.

## 2017-11-17 ENCOUNTER — Emergency Department (HOSPITAL_COMMUNITY): Payer: Medicare Other

## 2017-11-17 ENCOUNTER — Inpatient Hospital Stay (HOSPITAL_COMMUNITY)
Admission: EM | Admit: 2017-11-17 | Discharge: 2017-11-27 | DRG: 291 | Disposition: A | Discharge status: Home Health | Payer: Medicare Other | Attending: Family Medicine | Admitting: Family Medicine

## 2017-11-17 ENCOUNTER — Encounter (HOSPITAL_COMMUNITY): Payer: Self-pay

## 2017-11-17 ENCOUNTER — Other Ambulatory Visit: Payer: Self-pay

## 2017-11-17 DIAGNOSIS — E1142 Type 2 diabetes mellitus with diabetic polyneuropathy: Secondary | ICD-10-CM | POA: Diagnosis present

## 2017-11-17 DIAGNOSIS — N179 Acute kidney failure, unspecified: Secondary | ICD-10-CM

## 2017-11-17 DIAGNOSIS — Z66 Do not resuscitate: Secondary | ICD-10-CM | POA: Diagnosis present

## 2017-11-17 DIAGNOSIS — I2510 Atherosclerotic heart disease of native coronary artery without angina pectoris: Secondary | ICD-10-CM | POA: Diagnosis present

## 2017-11-17 DIAGNOSIS — I358 Other nonrheumatic aortic valve disorders: Secondary | ICD-10-CM | POA: Diagnosis present

## 2017-11-17 DIAGNOSIS — N400 Enlarged prostate without lower urinary tract symptoms: Secondary | ICD-10-CM | POA: Diagnosis present

## 2017-11-17 DIAGNOSIS — I130 Hypertensive heart and chronic kidney disease with heart failure and stage 1 through stage 4 chronic kidney disease, or unspecified chronic kidney disease: Secondary | ICD-10-CM | POA: Diagnosis not present

## 2017-11-17 DIAGNOSIS — Z79899 Other long term (current) drug therapy: Secondary | ICD-10-CM

## 2017-11-17 DIAGNOSIS — I5082 Biventricular heart failure: Secondary | ICD-10-CM | POA: Diagnosis present

## 2017-11-17 DIAGNOSIS — H9190 Unspecified hearing loss, unspecified ear: Secondary | ICD-10-CM | POA: Diagnosis present

## 2017-11-17 DIAGNOSIS — N189 Chronic kidney disease, unspecified: Secondary | ICD-10-CM | POA: Diagnosis present

## 2017-11-17 DIAGNOSIS — I480 Paroxysmal atrial fibrillation: Secondary | ICD-10-CM | POA: Diagnosis present

## 2017-11-17 DIAGNOSIS — Z550 Illiteracy and low-level literacy: Secondary | ICD-10-CM

## 2017-11-17 DIAGNOSIS — I509 Heart failure, unspecified: Secondary | ICD-10-CM | POA: Diagnosis not present

## 2017-11-17 DIAGNOSIS — N183 Chronic kidney disease, stage 3 (moderate): Secondary | ICD-10-CM | POA: Diagnosis present

## 2017-11-17 DIAGNOSIS — Z87442 Personal history of urinary calculi: Secondary | ICD-10-CM

## 2017-11-17 DIAGNOSIS — I454 Nonspecific intraventricular block: Secondary | ICD-10-CM

## 2017-11-17 DIAGNOSIS — I272 Pulmonary hypertension, unspecified: Secondary | ICD-10-CM | POA: Diagnosis present

## 2017-11-17 DIAGNOSIS — R0902 Hypoxemia: Secondary | ICD-10-CM | POA: Diagnosis present

## 2017-11-17 DIAGNOSIS — R570 Cardiogenic shock: Secondary | ICD-10-CM

## 2017-11-17 DIAGNOSIS — I313 Pericardial effusion (noninflammatory): Secondary | ICD-10-CM | POA: Diagnosis present

## 2017-11-17 DIAGNOSIS — I10 Essential (primary) hypertension: Secondary | ICD-10-CM | POA: Diagnosis present

## 2017-11-17 DIAGNOSIS — I447 Left bundle-branch block, unspecified: Secondary | ICD-10-CM | POA: Diagnosis present

## 2017-11-17 DIAGNOSIS — I420 Dilated cardiomyopathy: Secondary | ICD-10-CM | POA: Diagnosis present

## 2017-11-17 DIAGNOSIS — Z7902 Long term (current) use of antithrombotics/antiplatelets: Secondary | ICD-10-CM

## 2017-11-17 DIAGNOSIS — E1122 Type 2 diabetes mellitus with diabetic chronic kidney disease: Secondary | ICD-10-CM | POA: Diagnosis present

## 2017-11-17 DIAGNOSIS — Z8673 Personal history of transient ischemic attack (TIA), and cerebral infarction without residual deficits: Secondary | ICD-10-CM

## 2017-11-17 DIAGNOSIS — R140 Abdominal distension (gaseous): Secondary | ICD-10-CM | POA: Diagnosis present

## 2017-11-17 DIAGNOSIS — E785 Hyperlipidemia, unspecified: Secondary | ICD-10-CM | POA: Diagnosis present

## 2017-11-17 DIAGNOSIS — I5043 Acute on chronic combined systolic (congestive) and diastolic (congestive) heart failure: Secondary | ICD-10-CM | POA: Diagnosis present

## 2017-11-17 DIAGNOSIS — K5900 Constipation, unspecified: Secondary | ICD-10-CM | POA: Diagnosis present

## 2017-11-17 DIAGNOSIS — E11649 Type 2 diabetes mellitus with hypoglycemia without coma: Secondary | ICD-10-CM

## 2017-11-17 DIAGNOSIS — Z87891 Personal history of nicotine dependence: Secondary | ICD-10-CM

## 2017-11-17 DIAGNOSIS — Z794 Long term (current) use of insulin: Secondary | ICD-10-CM

## 2017-11-17 HISTORY — DX: Chronic kidney disease, unspecified: N18.9

## 2017-11-17 HISTORY — DX: Dilated cardiomyopathy: I42.0

## 2017-11-17 HISTORY — DX: Nonspecific intraventricular block: I45.4

## 2017-11-17 HISTORY — DX: Acute kidney failure, unspecified: N17.9

## 2017-11-17 HISTORY — DX: Paroxysmal atrial fibrillation: I48.0

## 2017-11-17 LAB — COMPREHENSIVE METABOLIC PANEL
ALBUMIN: 3.8 g/dL (ref 3.5–5.0)
ALK PHOS: 70 U/L (ref 38–126)
ALT: 16 U/L — ABNORMAL LOW (ref 17–63)
ANION GAP: 14 (ref 5–15)
AST: 21 U/L (ref 15–41)
BILIRUBIN TOTAL: 0.8 mg/dL (ref 0.3–1.2)
BUN: 59 mg/dL — ABNORMAL HIGH (ref 6–20)
CO2: 34 mmol/L — ABNORMAL HIGH (ref 22–32)
Calcium: 9.4 mg/dL (ref 8.9–10.3)
Chloride: 94 mmol/L — ABNORMAL LOW (ref 101–111)
Creatinine, Ser: 1.9 mg/dL — ABNORMAL HIGH (ref 0.61–1.24)
GFR calc Af Amer: 38 mL/min — ABNORMAL LOW (ref >60)
GFR, EST NON AFRICAN AMERICAN: 32 mL/min — AB (ref >60)
GLUCOSE: 66 mg/dL (ref 65–99)
POTASSIUM: 3.3 mmol/L — AB (ref 3.5–5.1)
Sodium: 142 mmol/L (ref 135–145)
TOTAL PROTEIN: 7.5 g/dL (ref 6.5–8.1)

## 2017-11-17 LAB — URINALYSIS, ROUTINE W REFLEX MICROSCOPIC
BILIRUBIN URINE: NEGATIVE
Glucose, UA: NEGATIVE mg/dL
Hgb urine dipstick: NEGATIVE
KETONES UR: NEGATIVE mg/dL
Leukocytes, UA: NEGATIVE
NITRITE: NEGATIVE
Protein, ur: NEGATIVE mg/dL
SPECIFIC GRAVITY, URINE: 1.008 (ref 1.005–1.030)
pH: 8 (ref 5.0–8.0)

## 2017-11-17 LAB — BRAIN NATRIURETIC PEPTIDE: B NATRIURETIC PEPTIDE 5: 1047.1 pg/mL — AB (ref 0.0–100.0)

## 2017-11-17 LAB — LIPASE, BLOOD: Lipase: 30 U/L (ref 11–51)

## 2017-11-17 LAB — CBC
HEMATOCRIT: 40.3 % (ref 39.0–52.0)
HEMOGLOBIN: 13.7 g/dL (ref 13.0–17.0)
MCH: 30.4 pg (ref 26.0–34.0)
MCHC: 34 g/dL (ref 30.0–36.0)
MCV: 89.4 fL (ref 78.0–100.0)
Platelets: 279 10*3/uL (ref 150–400)
RBC: 4.51 MIL/uL (ref 4.22–5.81)
RDW: 14.1 % (ref 11.5–15.5)
WBC: 7.1 10*3/uL (ref 4.0–10.5)

## 2017-11-17 LAB — CBG MONITORING, ED
GLUCOSE-CAPILLARY: 69 mg/dL (ref 65–99)
GLUCOSE-CAPILLARY: 79 mg/dL (ref 65–99)

## 2017-11-17 MED ORDER — FUROSEMIDE 10 MG/ML IJ SOLN
60.0000 mg | Freq: Once | INTRAMUSCULAR | Status: AC
  Administered 2017-11-17: 60 mg via INTRAVENOUS
  Filled 2017-11-17: qty 6

## 2017-11-17 NOTE — ED Notes (Signed)
Pt had episode of bradycardia and hypotension that resolved on its own, pt asymptomatic.

## 2017-11-17 NOTE — ED Notes (Addendum)
Pt brought to triage to for sugar check, CBG 69. Pt given OJ and will recheck in 15 min

## 2017-11-17 NOTE — ED Triage Notes (Signed)
Pt reports to the ed for complaints of discomfort in abdomen from it being so distended x 3 days. Pt also reports nausea. Translator used to triage.

## 2017-11-17 NOTE — ED Provider Notes (Signed)
[facility] EMERGENCY DEPARTMENT Provider Note   CSN: [id number] Arrival date & time: 11/17/17  1802     History   Chief Complaint Chief Complaint  Patient presents with  . Abdominal Pain    HPI Stanley Farmer is a 77 y.o. male.  Patient is a 77 year old male with a history of CHF, chronic kidney disease, diabetes, hypertension, abdominal distention, dilated cardiomyopathy sending today with 3 days of abdominal distention and exertional shortness of breath.  Patient has had some mild nausea and a bad taste in the back of his throat but no vomiting.  No change in his stools.  May be some slight abdominal pain on the left upper side but mostly just distention and tightness.  Patient does not check his weight regularly.  He has not had cough, fever or congestion.  He has not noticed if he has had swelling in his legs.  He is still taking all of his medications as prescribed.  He does take Demadex 80 mg twice daily and currently is also on insulin.  He has had decreased p.o. intake because he just does not feel hungry.  He denies any chest pain.   The history is provided by the patient and a relative.    Past Medical History:  Diagnosis Date  . CHF (congestive heart failure) (HCC)   . Diabetes mellitus without complication (HCC)   . Hypertension     Patient Active Problem List   Diagnosis Date Noted  . Abdominal distension   . Acute on chronic congestive heart failure (HCC)   . Type 2 diabetes mellitus with complication (HCC)   . CKD (chronic kidney disease)   . CHF exacerbation (HCC) 01/15/2016  . CHF (congestive heart failure) (HCC) 01/15/2016  . Abdominal bloating   . 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 Chronic combined systolic and diastolic CHF (congestive heart failure) (HCC) 04/21/2014  . Acute on chronic combined systolic and diastolic CHF (congestive heart failure) (HCC) 04/21/2014  . Acute respiratory failure (HCC) 04/21/2014  .  Essential hypertension 04/21/2014  . IDDM (insulin dependent diabetes mellitus) (HCC) 04/21/2014  . Non-English speaking patient 04/21/2014  . 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 Overweight 04/21/2014  . Acute on chronic systolic heart failure (HCC) 04/21/2014    Past Surgical History:  Procedure Laterality Date  . KIDNEY STONE SURGERY         Home Medications    Prior to Admission medications   Medication Sig Start Date End Date Taking? Authorizing Provider  torsemide (DEMADEX) 20 MG tablet Take 80 mg by mouth 2 (two) times daily. 03/17/16  Yes [provider]  alfuzosin (UROXATRAL) 10 MG 24 hr tablet Take 10 mg by mouth daily.    [provider]  atorvastatin (LIPITOR) 40 MG tablet Take 40 mg by mouth at bedtime.     [provider]  bisacodyl (DULCOLAX) 5 MG EC tablet Take 1 tablet (5 mg total) by mouth daily as needed for moderate constipation. Patient taking differently: Take 5 mg by mouth 2 (two) times daily.  04/28/14   Trixie Dredge, PA-C  carvedilol (COREG) 3.125 MG tablet Take 3.125 mg by mouth 2 (two) times daily with a meal.    [provider]  clopidogrel (PLAVIX) 75 MG tablet Take 75 mg by mouth daily.    [provider]  enalapril (VASOTEC) 2.5 MG tablet Take 2.5 mg by mouth  daily.    [provider]  famotidine (PEPCID) 20 MG tablet Take 1 tablet (20 mg total) by mouth 2 (two) times daily. 08/03/15   Rolan Bucco, MD  furosemide (LASIX) 80 MG tablet Take 80-160 mg by mouth 2 (two) times daily. Take 160mg  by mouth in the morning and take 80mg  by mouth at lunch.    [provider]  gabapentin (NEURONTIN) 300 MG capsule Take 300 mg by mouth 3 (three) times daily.    [provider]  insulin aspart (NOVOLOG) 100 UNIT/ML injection Inject 20-32 Units into the skin 3 (three) times daily with meals. Use 20 units every morning,  use 28-32 units at lunch and 28-32 units every evening    [provider]  insulin glargine (LANTUS) 100 UNIT/ML injection Inject 0.4 mLs (40 Units total) into the skin every morning. Patient not taking: Reported on 01/19/2016 01/17/16   Erasmo Downer, MD  KLOR-CON M20 20 MEQ tablet TAKE 1 TABLET BY MOUTH EVERY DAY 09/15/16   Bensimhon, Bevelyn Buckles, MD  metoCLOPramide (REGLAN) 5 MG tablet Take 5 mg by mouth 4 (four) times daily -  before meals and at bedtime.    [provider]  polyethylene glycol powder (GLYCOLAX/MIRALAX) powder Take one capful by mouth daily until stools are loose 01/19/16   Horton, Mayer Masker, MD    Family History Family History  Problem Relation Age of Onset  . Diabetes Mother     Social History Social History   Tobacco Use  . Smoking status: Former Smoker  Substance Use Topics  . Alcohol use: No  . Drug use: No     Allergies   Patient has no known allergies.   Review of Systems Review of Systems  All other systems reviewed and are negative.    Physical Exam Updated Vital Signs BP 101/68   Pulse 86   Temp 98.1 F (36.7 C) (Oral)   Resp 16   Ht 5\' 4"  (1.626 m)   Wt 81.6 kg (180 lb)   SpO2 99%   BMI 30.90 kg/m   Physical Exam  Constitutional: He is oriented to person, place, and time. He appears well-developed and well-nourished. No distress.  HENT:  Head: Normocephalic and atraumatic.  Mouth/Throat: Oropharynx is clear and moist.  Eyes: Conjunctivae and EOM are normal. Pupils are equal, round, and reactive to light.  Neck: Normal range of motion. Neck supple.  Cardiovascular: Normal rate, regular rhythm and intact distal pulses.  No murmur heard. Pulmonary/Chest: Effort normal. No respiratory distress. He has decreased breath sounds in the right lower field and the left lower field. He has no wheezes. He has no rales.  Abdominal: Soft. He exhibits distension. There is no tenderness. There is no rebound and no guarding.    Musculoskeletal: Normal range of motion. He exhibits edema. He exhibits no tenderness.  1+ pitting edema in bilateral ankles  Neurological: He is alert and oriented to person, place, and time.  Skin: Skin is warm and dry. No rash noted. No erythema.  Psychiatric: He has a normal mood and affect. His behavior is normal.  Nursing note and vitals reviewed.    ED Treatments / Results  Labs (all labs ordered are listed, but only abnormal results are displayed) Labs Reviewed  COMPREHENSIVE METABOLIC PANEL - Abnormal; Notable for the following components:      Result Value   Potassium 3.3 (*)    Chloride 94 (*)    CO2 34 (*)    BUN  59 (*)    Creatinine, Ser 1.90 (*)    ALT 16 (*)    GFR calc non Af Amer 32 (*)    GFR calc Af Amer 38 (*)    All other components within normal limits  BRAIN NATRIURETIC PEPTIDE - Abnormal; Notable for the following components:   B Natriuretic Peptide 1,047.1 (*)    All other components within normal limits  LIPASE, BLOOD  CBC  URINALYSIS, ROUTINE W REFLEX MICROSCOPIC  CBG MONITORING, ED  CBG MONITORING, ED    EKG  EKG Interpretation  Date/Time:  Thursday November 17 2017 18:29:39 EST Ventricular Rate:  95 PR Interval:    QRS Duration: 162 QT Interval:  458 QTC Calculation: 575 R Axis:   153 Text Interpretation:  Atrial fibrillation with premature ventricular or aberrantly conducted complexes Right axis deviation Non-specific intra-ventricular conduction block No significant change since last tracing Confirmed by Gwyneth Sprout ([id number]) on 11/17/2017 6:32:28 PM       Radiology Dg Abd Acute W/chest  Result Date: 11/17/2017 CLINICAL DATA:  Abdominal distention and shortness of breath. EXAM: DG ABDOMEN ACUTE W/ 1V CHEST COMPARISON:  01/19/2016 FINDINGS: Cardiac enlargement. Linear atelectasis or fibrosis in the left mid and lower lung. No airspace disease or consolidation. No pulmonary vascular congestion. No blunting of costophrenic angles. No  pneumothorax. Calcification of the aorta. Mediastinal contours appear intact. Gas and stool throughout the colon. No small or large bowel distention. No free intraperitoneal air. No abnormal air-fluid levels. No radiopaque stones. Calcified granuloma in the right cardiophrenic angle. Surgical clips in the right upper quadrant. Degenerative changes in the spine and hips. Vascular calcifications. IMPRESSION: Cardiac enlargement.  No evidence of active pulmonary disease. Nonobstructive bowel gas pattern with scattered stool throughout the colon. Electronically Signed   By: Burman Nieves M.D.   On: 11/17/2017 21:36    Procedures Procedures (including critical care time)  Medications Ordered in ED Medications  furosemide (LASIX) injection 60 mg (not administered)     Initial Impression / Assessment and Plan / ED Course  I have reviewed the triage vital signs and the nursing notes.  Pertinent labs & imaging results that were available during my care of the patient were reviewed by me and considered in my medical decision making (see chart for details).     Patient with multiple medical problems presenting today with abdominal distention and exertional dyspnea.  This is been ongoing for the last 3 days.  He denies any infectious symptoms and is still having bowel movements.  Low suspicion for small bowel obstruction.  Also he does not have significant abdominal pain it is more just distention and tightness.  Low suspicion at this time for kidney stone, pancreatitis, hepatitis or appendicitis.  Patient's labs are also negative for these things with a normal lipase, normal LFTs and bilirubin.  Concern for possible CHF as the cause of his symptoms he also has CKD with a creatinine of 1.9 and an GFR of 32 last CR 2 years ago was 1.5.  BNP, acute abdominal series pending.  Patient is in no acute distress at this time.   11:09 PM Images are wnl.  BNP shows elevated BNP of 1,047 from baseline in the  600's.  Discussed with pt and his family.  They would like to try IV diuresis and if is improved would like to go home to f/u with cards and PCP in [place].  Last echo was 2017 with EF of 20-25%.  11:11 PM Pt checked  out to Dr. Manus Gunning  Final Clinical Impressions(s) / ED Diagnoses   Final diagnoses:  Acute on chronic congestive heart failure, unspecified heart failure type [facility])    ED Discharge Orders    None       Gwyneth Sprout, MD 11/17/17 2312

## 2017-11-18 ENCOUNTER — Inpatient Hospital Stay (HOSPITAL_COMMUNITY): Payer: Medicare Other

## 2017-11-18 DIAGNOSIS — Z7902 Long term (current) use of antithrombotics/antiplatelets: Secondary | ICD-10-CM | POA: Diagnosis not present

## 2017-11-18 DIAGNOSIS — Z66 Do not resuscitate: Secondary | ICD-10-CM | POA: Diagnosis present

## 2017-11-18 DIAGNOSIS — K5900 Constipation, unspecified: Secondary | ICD-10-CM | POA: Diagnosis present

## 2017-11-18 DIAGNOSIS — I130 Hypertensive heart and chronic kidney disease with heart failure and stage 1 through stage 4 chronic kidney disease, or unspecified chronic kidney disease: Secondary | ICD-10-CM | POA: Diagnosis present

## 2017-11-18 DIAGNOSIS — I480 Paroxysmal atrial fibrillation: Secondary | ICD-10-CM

## 2017-11-18 DIAGNOSIS — I481 Persistent atrial fibrillation: Secondary | ICD-10-CM | POA: Diagnosis not present

## 2017-11-18 DIAGNOSIS — Z87891 Personal history of nicotine dependence: Secondary | ICD-10-CM | POA: Diagnosis not present

## 2017-11-18 DIAGNOSIS — I361 Nonrheumatic tricuspid (valve) insufficiency: Secondary | ICD-10-CM | POA: Diagnosis not present

## 2017-11-18 DIAGNOSIS — N400 Enlarged prostate without lower urinary tract symptoms: Secondary | ICD-10-CM | POA: Diagnosis present

## 2017-11-18 DIAGNOSIS — N183 Chronic kidney disease, stage 3 (moderate): Secondary | ICD-10-CM | POA: Diagnosis present

## 2017-11-18 DIAGNOSIS — R0902 Hypoxemia: Secondary | ICD-10-CM | POA: Diagnosis present

## 2017-11-18 DIAGNOSIS — I10 Essential (primary) hypertension: Secondary | ICD-10-CM | POA: Diagnosis not present

## 2017-11-18 DIAGNOSIS — E1142 Type 2 diabetes mellitus with diabetic polyneuropathy: Secondary | ICD-10-CM | POA: Diagnosis present

## 2017-11-18 DIAGNOSIS — I2510 Atherosclerotic heart disease of native coronary artery without angina pectoris: Secondary | ICD-10-CM | POA: Diagnosis present

## 2017-11-18 DIAGNOSIS — I340 Nonrheumatic mitral (valve) insufficiency: Secondary | ICD-10-CM | POA: Diagnosis not present

## 2017-11-18 DIAGNOSIS — I272 Pulmonary hypertension, unspecified: Secondary | ICD-10-CM | POA: Diagnosis present

## 2017-11-18 DIAGNOSIS — I5023 Acute on chronic systolic (congestive) heart failure: Secondary | ICD-10-CM

## 2017-11-18 DIAGNOSIS — Z87442 Personal history of urinary calculi: Secondary | ICD-10-CM | POA: Diagnosis not present

## 2017-11-18 DIAGNOSIS — E1122 Type 2 diabetes mellitus with diabetic chronic kidney disease: Secondary | ICD-10-CM | POA: Diagnosis present

## 2017-11-18 DIAGNOSIS — Z794 Long term (current) use of insulin: Secondary | ICD-10-CM | POA: Diagnosis not present

## 2017-11-18 DIAGNOSIS — I509 Heart failure, unspecified: Secondary | ICD-10-CM

## 2017-11-18 DIAGNOSIS — I358 Other nonrheumatic aortic valve disorders: Secondary | ICD-10-CM | POA: Diagnosis present

## 2017-11-18 DIAGNOSIS — N179 Acute kidney failure, unspecified: Secondary | ICD-10-CM | POA: Diagnosis present

## 2017-11-18 DIAGNOSIS — R140 Abdominal distension (gaseous): Secondary | ICD-10-CM

## 2017-11-18 DIAGNOSIS — Z79899 Other long term (current) drug therapy: Secondary | ICD-10-CM | POA: Diagnosis not present

## 2017-11-18 DIAGNOSIS — I5043 Acute on chronic combined systolic (congestive) and diastolic (congestive) heart failure: Secondary | ICD-10-CM | POA: Diagnosis present

## 2017-11-18 DIAGNOSIS — I420 Dilated cardiomyopathy: Secondary | ICD-10-CM | POA: Diagnosis present

## 2017-11-18 DIAGNOSIS — I313 Pericardial effusion (noninflammatory): Secondary | ICD-10-CM | POA: Diagnosis present

## 2017-11-18 DIAGNOSIS — H9190 Unspecified hearing loss, unspecified ear: Secondary | ICD-10-CM | POA: Diagnosis present

## 2017-11-18 DIAGNOSIS — R570 Cardiogenic shock: Secondary | ICD-10-CM | POA: Diagnosis present

## 2017-11-18 DIAGNOSIS — E785 Hyperlipidemia, unspecified: Secondary | ICD-10-CM | POA: Diagnosis present

## 2017-11-18 LAB — GLUCOSE, CAPILLARY
Glucose-Capillary: 234 mg/dL — ABNORMAL HIGH (ref 65–99)
Glucose-Capillary: 221 mg/dL — ABNORMAL HIGH (ref 65–99)

## 2017-11-18 LAB — ECHOCARDIOGRAM COMPLETE
CHL CUP LVOT MV VTI INDEX: 0.21 cm2/m2
CHL CUP MV DEC (S): 194
CHL CUP RV SYS PRESS: 76 mmHg
CHL CUP TV REG PEAK VELOCITY: 390 cm/s
E decel time: 194 msec
FS: 3 % — AB (ref 28–44)
Height: 64 in
IV/PV OW: 0.83
LA ID, A-P, ES: 48 mm
LA vol index: 43.5 mL/m2
LA vol: 84.8 mL
LADIAMINDEX: 2.46 cm/m2
LAVOLA4C: 69.1 mL
LEFT ATRIUM END SYS DIAM: 48 mm
LVOT MV VTI: 0.41
LVOT VTI: 12.6 cm
LVOT area: 2.84 cm2
LVOT diameter: 19 mm
LVOTPV: 81.6 cm/s
LVOTSV: 36 mL
MRPISAEROA: 0.2 cm2
MV Annulus VTI: 87.3 cm
MV M vel: 211
MV Peak grad: 5 mmHg
MV VTI: 133 cm
MVPKEVEL: 111 m/s
Mean grad: 26 mmHg
P 1/2 time: 357 ms
PW: 11.9 mm — AB (ref 0.6–1.1)
TAPSE: 14.1 mm
TR max vel: 390 cm/s
Weight: 2880 oz

## 2017-11-18 LAB — CBC WITH DIFFERENTIAL/PLATELET
BASOS ABS: 0 10*3/uL (ref 0.0–0.1)
Basophils Relative: 1 %
Eosinophils Absolute: 0.1 10*3/uL (ref 0.0–0.7)
Eosinophils Relative: 2 %
HCT: 37.9 % — ABNORMAL LOW (ref 39.0–52.0)
HEMOGLOBIN: 12.3 g/dL — AB (ref 13.0–17.0)
LYMPHS PCT: 34 %
Lymphs Abs: 2.2 10*3/uL (ref 0.7–4.0)
MCH: 29.4 pg (ref 26.0–34.0)
MCHC: 32.5 g/dL (ref 30.0–36.0)
MCV: 90.5 fL (ref 78.0–100.0)
MONO ABS: 0.4 10*3/uL (ref 0.1–1.0)
Monocytes Relative: 6 %
NEUTROS PCT: 57 %
Neutro Abs: 3.6 10*3/uL (ref 1.7–7.7)
Platelets: 271 10*3/uL (ref 150–400)
RBC: 4.19 MIL/uL — ABNORMAL LOW (ref 4.22–5.81)
RDW: 14.5 % (ref 11.5–15.5)
WBC: 6.3 10*3/uL (ref 4.0–10.5)

## 2017-11-18 LAB — CBG MONITORING, ED
GLUCOSE-CAPILLARY: 97 mg/dL (ref 65–99)
GLUCOSE-CAPILLARY: 175 mg/dL — AB (ref 65–99)
GLUCOSE-CAPILLARY: 95 mg/dL (ref 65–99)
GLUCOSE-CAPILLARY: 193 mg/dL — AB (ref 65–99)

## 2017-11-18 LAB — TROPONIN I
Troponin I: 0.06 ng/mL (ref <0.03)
Troponin I: 0.06 ng/mL (ref <0.03)
Troponin I: 0.06 ng/mL (ref <0.03)

## 2017-11-18 LAB — MAGNESIUM: Magnesium: 2.5 mg/dL — ABNORMAL HIGH (ref 1.7–2.4)

## 2017-11-18 MED ORDER — AMIODARONE HCL IN DEXTROSE 360-4.14 MG/200ML-% IV SOLN
30.0000 mg/h | INTRAVENOUS | Status: DC
  Administered 2017-11-19 – 2017-11-25 (×11): 30 mg/h via INTRAVENOUS
  Filled 2017-11-18 (×13): qty 200

## 2017-11-18 MED ORDER — SODIUM CHLORIDE 0.9 % IV SOLN: 250.0000 mL | INTRAVENOUS | Status: DC | PRN

## 2017-11-18 MED ORDER — ATORVASTATIN CALCIUM 40 MG PO TABS
40.0000 mg | ORAL_TABLET | Freq: Every day | ORAL | Status: DC
  Administered 2017-11-18 – 2017-11-26 (×9): 40 mg via ORAL
  Filled 2017-11-18 (×10): qty 1

## 2017-11-18 MED ORDER — AMIODARONE HCL IN DEXTROSE 360-4.14 MG/200ML-% IV SOLN
60.0000 mg/h | INTRAVENOUS | Status: AC
  Administered 2017-11-18 (×2): 60 mg/h via INTRAVENOUS
  Filled 2017-11-18 (×2): qty 200

## 2017-11-18 MED ORDER — SODIUM CHLORIDE 0.9% FLUSH: 3.0000 mL | INTRAVENOUS | Status: DC | PRN

## 2017-11-18 MED ORDER — BISACODYL 5 MG PO TBEC: 5.0000 mg | DELAYED_RELEASE_TABLET | Freq: Every day | ORAL | Status: DC | PRN

## 2017-11-18 MED ORDER — INSULIN ASPART 100 UNIT/ML ~~LOC~~ SOLN
0.0000 [IU] | Freq: Three times a day (TID) | SUBCUTANEOUS | Status: DC
  Administered 2017-11-18: 4 [IU] via SUBCUTANEOUS
  Administered 2017-11-18: 7 [IU] via SUBCUTANEOUS
  Administered 2017-11-18: 4 [IU] via SUBCUTANEOUS
  Administered 2017-11-19: 3 [IU] via SUBCUTANEOUS
  Administered 2017-11-19 – 2017-11-20 (×2): 4 [IU] via SUBCUTANEOUS
  Administered 2017-11-20: 7 [IU] via SUBCUTANEOUS
  Administered 2017-11-21 – 2017-11-22 (×2): 11 [IU] via SUBCUTANEOUS
  Administered 2017-11-23: 3 [IU] via SUBCUTANEOUS
  Administered 2017-11-23: 11 [IU] via SUBCUTANEOUS
  Administered 2017-11-24 – 2017-11-26 (×5): 7 [IU] via SUBCUTANEOUS
  Administered 2017-11-26: 3 [IU] via SUBCUTANEOUS
  Administered 2017-11-27: 4 [IU] via SUBCUTANEOUS
  Filled 2017-11-18 (×2): qty 1

## 2017-11-18 MED ORDER — ONDANSETRON HCL 4 MG/2ML IJ SOLN: 4.0000 mg | Freq: Four times a day (QID) | INTRAMUSCULAR | Status: DC | PRN

## 2017-11-18 MED ORDER — FUROSEMIDE 10 MG/ML IJ SOLN
100.0000 mg | Freq: Two times a day (BID) | INTRAVENOUS | Status: DC
  Filled 2017-11-18: qty 10

## 2017-11-18 MED ORDER — POLYETHYLENE GLYCOL 3350 17 G PO PACK
17.0000 g | PACK | Freq: Two times a day (BID) | ORAL | Status: DC
  Administered 2017-11-18 – 2017-11-27 (×14): 17 g via ORAL
  Filled 2017-11-18 (×15): qty 1

## 2017-11-18 MED ORDER — ACETAMINOPHEN 325 MG PO TABS
650.0000 mg | ORAL_TABLET | ORAL | Status: DC | PRN
  Administered 2017-11-23: 650 mg via ORAL
  Filled 2017-11-18: qty 2

## 2017-11-18 MED ORDER — GABAPENTIN 300 MG PO CAPS
300.0000 mg | ORAL_CAPSULE | Freq: Three times a day (TID) | ORAL | Status: DC
  Administered 2017-11-18 – 2017-11-21 (×11): 300 mg via ORAL
  Filled 2017-11-18 (×11): qty 1

## 2017-11-18 MED ORDER — INSULIN GLARGINE 100 UNIT/ML ~~LOC~~ SOLN
40.0000 [IU] | Freq: Every morning | SUBCUTANEOUS | Status: DC
  Administered 2017-11-18 – 2017-11-24 (×7): 40 [IU] via SUBCUTANEOUS
  Filled 2017-11-18 (×7): qty 0.4

## 2017-11-18 MED ORDER — APIXABAN 5 MG PO TABS
5.0000 mg | ORAL_TABLET | Freq: Two times a day (BID) | ORAL | Status: DC
  Administered 2017-11-18 – 2017-11-27 (×18): 5 mg via ORAL
  Filled 2017-11-18 (×18): qty 1

## 2017-11-18 MED ORDER — FAMOTIDINE 20 MG PO TABS
20.0000 mg | ORAL_TABLET | Freq: Two times a day (BID) | ORAL | Status: DC
  Administered 2017-11-18 – 2017-11-19 (×3): 20 mg via ORAL
  Filled 2017-11-18 (×3): qty 1

## 2017-11-18 MED ORDER — POTASSIUM CHLORIDE CRYS ER 20 MEQ PO TBCR
40.0000 meq | EXTENDED_RELEASE_TABLET | ORAL | Status: AC
  Administered 2017-11-18: 40 meq via ORAL
  Filled 2017-11-18: qty 2

## 2017-11-18 MED ORDER — AMIODARONE LOAD VIA INFUSION
150.0000 mg | Freq: Once | INTRAVENOUS | Status: AC
  Administered 2017-11-18: 150 mg via INTRAVENOUS
  Filled 2017-11-18: qty 83.34

## 2017-11-18 MED ORDER — SODIUM CHLORIDE 0.9% FLUSH
3.0000 mL | Freq: Two times a day (BID) | INTRAVENOUS | Status: DC
  Administered 2017-11-18 – 2017-11-26 (×11): 3 mL via INTRAVENOUS
  Administered 2017-11-26: 10 mL via INTRAVENOUS

## 2017-11-18 MED ORDER — ENOXAPARIN SODIUM 40 MG/0.4ML ~~LOC~~ SOLN
40.0000 mg | Freq: Every day | SUBCUTANEOUS | Status: DC
  Filled 2017-11-18: qty 0.4

## 2017-11-18 MED ORDER — POTASSIUM CHLORIDE CRYS ER 20 MEQ PO TBCR
40.0000 meq | EXTENDED_RELEASE_TABLET | Freq: Every day | ORAL | Status: DC
  Administered 2017-11-18 – 2017-11-27 (×10): 40 meq via ORAL
  Filled 2017-11-18 (×10): qty 2

## 2017-11-18 MED ORDER — CARVEDILOL 3.125 MG PO TABS
3.1250 mg | ORAL_TABLET | Freq: Two times a day (BID) | ORAL | Status: DC
  Filled 2017-11-18 (×2): qty 1

## 2017-11-18 MED ORDER — CLOPIDOGREL BISULFATE 75 MG PO TABS
75.0000 mg | ORAL_TABLET | Freq: Every day | ORAL | Status: DC
  Administered 2017-11-18: 75 mg via ORAL
  Filled 2017-11-18: qty 1

## 2017-11-18 MED ORDER — PERFLUTREN LIPID MICROSPHERE
1.0000 mL | INTRAVENOUS | Status: AC | PRN
  Administered 2017-11-18: 2 mL via INTRAVENOUS
  Filled 2017-11-18: qty 10

## 2017-11-18 MED ORDER — FUROSEMIDE 10 MG/ML IJ SOLN
80.0000 mg | Freq: Two times a day (BID) | INTRAMUSCULAR | Status: DC
  Administered 2017-11-18 – 2017-11-21 (×7): 80 mg via INTRAVENOUS
  Filled 2017-11-18 (×8): qty 8

## 2017-11-18 MED ORDER — ALFUZOSIN HCL ER 10 MG PO TB24
10.0000 mg | ORAL_TABLET | Freq: Every day | ORAL | Status: DC
  Administered 2017-11-18 – 2017-11-27 (×10): 10 mg via ORAL
  Filled 2017-11-18 (×11): qty 1

## 2017-11-18 NOTE — Consult Note (Signed)
Advanced Heart Failure Team Consult Note   Primary Physician: Arlan Organ Primary Cardiologist: [facility]- Elissa Hefty, NP  Reason for Consultation: Acute on chronic combined HF, NICM  HPI:    Stanley Farmer is seen today for evaluation of acute on chronic systolic HF at the request of internal medicine.   Stanley Farmer is a 77 y.o. 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 PMH also includes CKD stage III, DM type II, hyperlipidemia, mild cognitive impairment, and remote alcohol tobacco abuse. He presented to the ED on 11/17/17 with 3 days of abdominal swelling, dyspnea on exertion, fatigue, and decreased UOP. At baseline, patient ambulates with a walker. Pt had an echo at [facility] in 10/18, where EF was 20%.   Patient admitted for diuresis and re-evaluation of HF. Pertinent labs on admission: Cr 1.9 (baseline ~ 1.5), K 3.3, Mg 2.5, BNP 1047, Ti 0.06. Chest/Abdomen XR showed cardiac enlargement with no active pulmonary disease. Was given IV Lasix 80mg  BID and echo ordered. EKG on admission showed possible atrial fibrillation and prolonged QTC of 575.   Engineer, structural Present  Patient poor historian. Complicated further by hearing and cognitive impairment with history of stroke. Wife in room, provided limited history. Pt states he has felt bad "for years" with no specific reason he came in this time other than the "extra fluid".  Patient had not been feeling well "for a long time" and his greatest concern was "the fluid." Denies chest pain. Pt has never undergone cardiac cath, and has repeated refused ICD. He denies SOB at rest or with walking, but then pantomimes being SOB with activity. His wife endorses SOB with exertion. He denies orthopnea, but as above not clear he understood despite multiple re-phrasings. He knows that his heart is weak, and per wife has previously been told  it is "out of rhythm".  Nuclear Stress 2011 (Per [facility] Note) with fixed filling defect and no ischemia.   Echo (01/16/16): LVEF: 10-15%, Diffuse HK, Grade II DD, Mod AR, mild LAE, RV function moderately reduced, moderate LAE, moderate TR, PA peak pressure 57mm Hg, mild posterior pericardial effusion   Echo 07/2016 ([place]) EF 20%  DG Abd Acute w/ Chest (11/17/17): Cardiac enlargement. No evidence of active pulmonary disease.   EKG (11/17/17): atrial fibrillation, HR 95, with PVCs, LBBB. No significant change since last EKG in 4/17.   Review of systems complete and found to be negative unless listed in HPI.    Home Medications Prior to Admission medications   Medication Sig Start Date End Date Taking? Authorizing Provider  alfuzosin (UROXATRAL) 10 MG 24 hr tablet Take 10 mg by mouth daily.   Yes [provider]  atorvastatin (LIPITOR) 40 MG tablet Take 40 mg by mouth at bedtime.    Yes [provider]  carvedilol (COREG) 3.125 MG tablet Take 3.125 mg by mouth 2 (two) times daily with a meal.   Yes [provider]  clopidogrel (PLAVIX) 75 MG tablet Take 75 mg by mouth daily.   Yes [provider]  enalapril (VASOTEC) 2.5 MG tablet Take 2.5 mg by mouth daily.   Yes [provider]  famotidine (PEPCID) 20 MG tablet Take 1 tablet (20 mg total) by mouth 2 (two) times daily. 08/03/15  Yes Rolan Bucco, MD  gabapentin (NEURONTIN) 300 MG capsule Take 300 mg by mouth 3 (three) times daily.   Yes [provider]  insulin aspart (NOVOLOG) 100 UNIT/ML injection Inject 20-32 Units into the skin 3 (three) times daily with meals. Use 20 units every morning, use 28-32 units at lunch and 28-32 units every evening   Yes [provider]  insulin glargine (LANTUS) 100 UNIT/ML injection Inject 0.4 mLs (40 Units total) into the skin every morning. Patient taking differently: Inject 80 Units into the skin every morning.  01/17/16  Yes  Bacigalupo, Marzella Schlein, MD  KLOR-CON M20 20 MEQ tablet TAKE 1 TABLET BY MOUTH EVERY DAY Patient taking differently: TAKE 40 mg TABLET BY MOUTH EVERY DAY 09/15/16  Yes Adelei Scobey, Bevelyn Buckles, MD  metolazone (ZAROXOLYN) 5 MG tablet Take 5 mg by mouth 2 (two) times a week. 10/26/17  Yes [provider]  nitroGLYCERIN (NITROLINGUAL) 0.4 MG/SPRAY spray Place 1 spray under the tongue every 5 (five) minutes x 3 doses as needed for chest pain.   Yes [provider]  polyethylene glycol powder (GLYCOLAX/MIRALAX) powder Take one capful by mouth daily until stools are loose Patient taking differently: Take 17 g by mouth 2 (two) times daily. Take one capful by mouth daily until stools are loose 01/19/16  Yes Horton, Mayer Masker, MD  polyethylene glycol powder (GLYCOLAX/MIRALAX) powder Take 17 g by mouth 2 (two) times daily. 09/15/17  Yes [provider]  torsemide (DEMADEX) 20 MG tablet Take 80 mg by mouth 2 (two) times daily. 03/17/16  Yes [provider]  bisacodyl (DULCOLAX) 5 MG EC tablet Take 1 tablet (5 mg total) by mouth daily as needed for moderate constipation. Patient taking differently: Take 5 mg by mouth 2 (two) times daily.  04/28/14   Trixie Dredge, PA-C    Past Medical History: Past Medical History:  Diagnosis Date  . CHF (congestive heart failure) (HCC)   . Diabetes mellitus without complication (HCC)   . Hypertension     Past Surgical History: Past Surgical History:  Procedure Laterality Date  . KIDNEY STONE SURGERY      Family History: Family History  Problem Relation Age of Onset  . Diabetes Mother     Social History: Social History   Socioeconomic History  . Marital status: Married    Spouse name: None  . Number of children: None  . Years of education: None  . Highest education level: None  Social Needs  . Financial resource strain: None  . Food insecurity - worry: None  . Food insecurity - inability: None  . Transportation needs - medical:  None  . Transportation needs - non-medical: None  Occupational History  . None  Tobacco Use  . Smoking status: Former Smoker  Substance and Sexual Activity  . Alcohol use: No  . Drug use: No  . Sexual activity: Not Currently  Other Topics Concern  . None  Social History Narrative  . None    Allergies:  No Known Allergies  Objective:    Vital Signs:   Temp:  [98.1 F (36.7 C)] 98.1 F (36.7 C) (01/31 1823) Pulse Rate:  [43-97] 92 (02/01 0830) Resp:  [14-21] 17 (02/01 0830) BP: (87-119)/(46-98) 104/81 (02/01 0830) SpO2:  [89 %-100 %] 95 % (02/01 0830) Weight:  [180 lb (81.6 kg)] 180 lb (81.6 kg) (01/31 2034)    Weight change: Filed Weights   11/17/17 1830 11/17/17 2034  Weight: 180 lb (81.6 kg) 180 lb (81.6 kg)    Intake/Output:   Intake/Output Summary (Last 24 hours) at 11/18/2017 0938 Last data filed at 11/18/2017 0658 Gross per 24 hour  Intake -  Output 1700 ml  Net -1700 ml      Physical Exam    General:  Obese male. No resp difficulty on exam. HEENT: Hearing decreased, Blind in left eye.  Neck: Supple. JVP elevated to jaw. Carotids 2+ bilat; no bruits. No lymphadenopathy or thyromegaly appreciated. Cor: PMI nondisplaced. Irregular rate & rhythm. + s3 Lungs: Diminished bilaterally.  Abdomen: soft, nontender, ++ distended, No hepatosplenomegaly. No bruits or masses. Good bowel sounds. Extremities: No cyanosis, clubbing, or rash. Pitting edema 2+ to knees bilaterally.  Neuro: alert & oriented to person and place, difficulty with simple conversation despite Spanish translator, cranial nerves grossly intact. moves all 4 extremities w/o difficulty. Affect pleasant  Telemetry   Afib with PVCs, Rate 90s-100s, Personally reviewed.   EKG    EKG (11/17/17): atrial fibrillation, HR 95, with PVCs, LBBB. No significant change since last EKG in 4/17.  Labs   Basic Metabolic Panel: Recent Labs  Lab 11/17/17 1831 11/18/17 0310  NA 142  --   K 3.3*  --   CL  94*  --   CO2 34*  --   GLUCOSE 66  --   BUN 59*  --   CREATININE 1.90*  --   CALCIUM 9.4  --   MG  --  2.5*    Liver Function Tests: Recent Labs  Lab 11/17/17 1831  AST 21  ALT 16*  ALKPHOS 70  BILITOT 0.8  PROT 7.5  ALBUMIN 3.8   Recent Labs  Lab 11/17/17 1831  LIPASE 30   No results for input(s): AMMONIA in the last 168 hours.  CBC: Recent Labs  Lab 11/17/17 1831 11/18/17 0310  WBC 7.1 6.3  NEUTROABS  --  3.6  HGB 13.7 12.3*  HCT 40.3 37.9*  MCV 89.4 90.5  PLT 279 271    Cardiac Enzymes: Recent Labs  Lab 11/18/17 0310  TROPONINI 0.06*    BNP: BNP (last 3 results) Recent Labs    11/17/17 1831  BNP 1,047.1*    ProBNP (last 3 results) No results for input(s): PROBNP in the last 8760 hours.   CBG: Recent Labs  Lab 11/17/17 1954 11/17/17 2017 11/18/17 0157 11/18/17 0809  GLUCAP 69 79 97 175*    Coagulation Studies: No results for input(s): LABPROT, INR in the last 72 hours.   Imaging   Dg Abd Acute W/chest  Result Date: 11/17/2017 CLINICAL DATA:  Abdominal distention and shortness of breath. EXAM: DG ABDOMEN ACUTE W/ 1V CHEST COMPARISON:  01/19/2016 FINDINGS: Cardiac enlargement. Linear atelectasis or fibrosis in the left mid and lower lung. No airspace disease or consolidation. No pulmonary vascular congestion. No blunting of costophrenic angles. No pneumothorax. Calcification of the aorta. Mediastinal contours appear intact. Gas and stool throughout the colon. No small or large bowel distention. No free intraperitoneal air. No abnormal air-fluid levels. No radiopaque stones. Calcified granuloma in the right cardiophrenic angle. Surgical clips in the right upper quadrant. Degenerative changes in the spine and hips. Vascular calcifications. IMPRESSION: Cardiac enlargement.  No evidence of active pulmonary disease. Nonobstructive bowel gas pattern with scattered stool throughout the colon. Electronically Signed   By: Burman Nieves M.D.    On: 11/17/2017 21:36      Medications:     Current Medications: . alfuzosin  10 mg Oral Daily  . atorvastatin  40 mg Oral QHS  . carvedilol  3.125 mg Oral BID WC  . clopidogrel  75 mg Oral Daily  . enoxaparin (LOVENOX) injection  40 mg Subcutaneous Daily  . famotidine  20 mg Oral BID  . furosemide  80 mg Intravenous BID  . gabapentin  300 mg Oral TID  . insulin aspart  0-20 Units Subcutaneous TID WC  . insulin glargine  40 Units Subcutaneous q morning - 10a  . polyethylene glycol  17 g Oral BID  . potassium chloride SA  40 mEq Oral Daily  . sodium chloride flush  3 mL Intravenous Q12H     Infusions: . sodium chloride         Patient Profile   Stanley Farmer is a 77 y.o. 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 PMH also includes CKD stage III, DM type II, hyperlipidemia, mild cognitive impairment, and remote alcohol tobacco abuse.   Assessment/Plan  1. Acute on Chronic Combined HF, Unclear Etiology, most recent echo on file: EF 15-20%, Grade II DD, decreased RV function - Long standing CMP, no h/o of cath, but Nuclear stress 2011 with fixed defect and no ischemia - Echo 07/2016 20%. Echo today shows EF 10%. Personally reviewed by Dr. Gala Romney. - Fluid status markedly elevated on exam - Diuresed well overnight with 80 mg IV lasix with > 1.7 L out. Will continue  IV Lasix 80 mg BID - Receive repeat echo results - Cr 1.9 (baseline ~ 1.5), K 3.3, Mg 2.5 - Hold coreg with decompensation.  - Supplement K BID - Hold enalapril this am. Consider adding Losartan as pressure improves.  - Consider adding spiro as BP improves - Check daily BMET  2. Atrial fibrillation - Unclear chronicity. May be related to his decompensation. He denies palpitations, though is a terrible historian so no way to establish a time line.   Most recent EKGs in chart in April/May 2017 show NSR.  -  CHADSVaSc is 8. Will discuss optimal anticoagulation with MD. Wife asks if we will speak to her son, who speaks english.  - Will use amiodarone gtt for rate control.  We are aware of risk of stroke, but pt is a poor candidate to other rate control agents such as BB with severe HF.  - If does not improve on amiodarone, will attempt TEE/DCCV on Monday.   3. Hypermagnesemia - Continue diuretics as above.   3. Elevated troponin - 0.06 on admission. Continue to trend. - Denies chest pain.  4. CKD III - Cr 1.9 on admission.  - Continue daily BMET.  5. HTN - Soft today. Will manage in setting of treating his HF.   6. H/o CVA - With cognitive deficits.   - It is unclear why he is not on long term anticoagulation.  - He is on Plavix. No h/o of cath or PAD in chart.   Very difficult patient with large number of co-morbidities. He is not a candidate for advanced therapies with same and advanced age. Will discuss TEE/DCCV and anticoagulation with MD and patient, if not candidate will need to aim for rate control.  Continue to diurese as above.  Can consider PICC line for CVP/Coox.  Medication concerns reviewed with patient and pharmacy team. Barriers identified: Compliance. Language Barrier. Cognitive impairment.  Length of Stay: 0  Frances Furbish, [place] Neighbor  11/18/2017, 9:38 AM  Advanced Heart Failure Team Pager [phone] (M-F; 7a - 4p)  Please contact [facility] for night-coverage after hours (4p -7a ) and weekends on [email or web address]  Patient seen and examined with the above-signed Student.  I personally reviewed laboratory data, imaging studies and relevant notes. I independently examined the patient and formulated the important aspects of the plan. I have edited the note to reflect any of my changes or salient points. I have personally discussed the plan with the patient and/or family.   Graciella Freer, PA-C  11/18/2017 12:25 PM   Patient seen and examined with the above-signed  Advanced Practice Provider and/or Housestaff. I personally reviewed laboratory data, imaging studies and relevant notes. I independently examined the patient and formulated the important aspects of the plan. I have edited the note to reflect any of my changes or salient points. I have personally discussed the plan with the patient and/or family.  77 y/o Hispanic male with long history of systolic HF with markedly depressed EF. Previously followed by Dr. Tomie China. Felt to be NICM but has not had formal ischemic work-up.   History very difficult to obtain due to his hearing loss and seemingly poor comprehension despite use of interpreter line and presence of his wife. Presented to the hospital due to abdominal swelling, fatigue and feeling of doom. Started on IV lasix with improvement and says he now feels much better.   Work-up notable for volume overload and AF with RVR (denies history of this). Denies recent ETOH use. ("years ago" per wife)  Echo today (Personally reviewed) shows severe biventricular dysfunction with EF 10-15%.   On exam. JVP to jaw Cor IRR tachy +s3 Ab distended Mild edema. Ext warm  Suspect HF deterioration may be due to AF with RVR. Will start Eliquis and IV amio. Plan TEE and DC-CV next week. If creatinine improves may be worth doing R/L cath prior to DC-CV to clearly assess etiology.   [name], MD  6:12 PM

## 2017-11-18 NOTE — Progress Notes (Signed)
  Echocardiogram 2D Echocardiogram has been performed.  Janalyn Harder 11/18/2017, 3:34 PM

## 2017-11-18 NOTE — ED Notes (Signed)
Echo at bedside

## 2017-11-18 NOTE — H&P (Addendum)
History and Physical    [name] VZD:[id number] DOB: 1941/01/22 DOA: 11/17/2017  Referring MD/NP/PA: Dr. Manus Gunning PCP: [name], MD  Patient coming from:   Chief Complaint: Abdominal swelling I have personally briefly reviewed patient's old medical records in [facility]   HPI: Stanley Farmer is a 77 y.o. spanish speaking male with medical history significant for systolic CHF last EF noted to be 07/2016 with EF 20%, dilated cardiomyopathy, CKD stage III, DM type II, hyperlipidemia, and remote alcohol tobacco abuse; who presents with complaints of 3 days of abdominal swelling and shortness of breath with exertion.  Patient's son is at bedside and helps translate.  Patient has had similar symptoms like this past with CHF exacerbations per review of notes on care everywhere.  Baseline patient ambulates with use of a walker.  He has been more fatigued than he has had generalized weakness not wanting to do much.  Associated symptoms include decreased urine output and abdominal discomfort.  ED Course: Upon admission into the emergency department patient was noted to be afebrile pulse of 43-93, respirations 14-18, blood pressure 87/72-109/98, O2 saturation noted to be as low as 82% with ambulation.  Labs revealed normal CBC, potassium 3.3, BUN 59, creatinine 1.9, glucose 66, BNP 1047.1.  Acute abdominal series showed nonobstructive bowel gas pattern with scattered stool throughout the colon and cardiac enlargement without significant active disease.  Urinalysis was negative for any abnormalities.  Patient was given 60 mg of Lasix IV.  TRH called to admit.  Review of Systems  Constitutional: Positive for malaise/fatigue.  HENT: Negative for ear discharge and ear pain.   Eyes: Negative for photophobia and discharge.  Respiratory: Positive for shortness of breath. Negative for cough and hemoptysis.   Cardiovascular: Positive for orthopnea and leg swelling. Negative for chest pain.    Gastrointestinal: Positive for abdominal pain.  Genitourinary: Negative for dysuria and frequency.  Musculoskeletal: Negative for falls and myalgias.  Skin: Negative for itching and rash.  Neurological: Positive for weakness. Negative for speech change and focal weakness.  Psychiatric/Behavioral: Negative for memory loss and substance abuse. The patient is not nervous/anxious.     Past Medical History:  Diagnosis Date  . CHF (congestive heart failure) (HCC)   . Diabetes mellitus without complication (HCC)   . Hypertension     Past Surgical History:  Procedure Laterality Date  . KIDNEY STONE SURGERY       reports that he has quit smoking. He does not have any smokeless tobacco history on file. He reports that he does not drink alcohol or use drugs.  No Known Allergies  Family History  Problem Relation Age of Onset  . Diabetes Mother     Prior to Admission medications   Medication Sig Start Date End Date Taking? Authorizing Provider  alfuzosin (UROXATRAL) 10 MG 24 hr tablet Take 10 mg by mouth daily.   Yes [provider]  atorvastatin (LIPITOR) 40 MG tablet Take 40 mg by mouth at bedtime.    Yes [provider]  carvedilol (COREG) 3.125 MG tablet Take 3.125 mg by mouth 2 (two) times daily with a meal.   Yes [provider]  clopidogrel (PLAVIX) 75 MG tablet Take 75 mg by mouth daily.   Yes [provider]  enalapril (VASOTEC) 2.5 MG tablet Take 2.5 mg by mouth daily.   Yes [provider]  famotidine (PEPCID) 20 MG tablet Take 1 tablet (20 mg total) by mouth 2 (two) times daily. 08/03/15  Yes Rolan Bucco, MD  gabapentin (NEURONTIN) 300 MG capsule Take 300 mg by mouth 3 (three) times daily.   Yes [provider]  insulin aspart (NOVOLOG) 100 UNIT/ML injection Inject 20-32 Units into the skin 3 (three) times daily with meals. Use 20 units every morning, use 28-32 units at lunch and 28-32 units every evening   Yes [provider]  insulin glargine (LANTUS) 100 UNIT/ML injection Inject 0.4 mLs (40 Units total) into the skin every morning. Patient taking differently: Inject 80 Units into the skin every morning.  01/17/16  Yes Bacigalupo, Marzella Schlein, MD  KLOR-CON M20 20 MEQ tablet TAKE 1 TABLET BY MOUTH EVERY DAY Patient taking differently: TAKE 40 mg TABLET BY MOUTH EVERY DAY 09/15/16  Yes Bensimhon, Bevelyn Buckles, MD  metolazone (ZAROXOLYN) 5 MG tablet Take 5 mg by mouth 2 (two) times a week. 10/26/17  Yes [provider]  nitroGLYCERIN (NITROLINGUAL) 0.4 MG/SPRAY spray Place 1 spray under the tongue every 5 (five) minutes x 3 doses as needed for chest pain.   Yes [provider]  polyethylene glycol powder (GLYCOLAX/MIRALAX) powder Take one capful by mouth daily until stools are loose Patient taking differently: Take 17 g by mouth 2 (two) times daily. Take one capful by mouth daily until stools are loose 01/19/16  Yes Horton, Mayer Masker, MD  polyethylene glycol powder (GLYCOLAX/MIRALAX) powder Take 17 g by mouth 2 (two) times daily. 09/15/17  Yes [provider]  torsemide (DEMADEX) 20 MG tablet Take 80 mg by mouth 2 (two) times daily. 03/17/16  Yes [provider]  bisacodyl (DULCOLAX) 5 MG EC tablet Take 1 tablet (5 mg total) by mouth daily as needed for moderate constipation. Patient taking differently: Take 5 mg by mouth 2 (two) times daily.  04/28/14   Trixie Dredge, PA-C    Physical Exam:  Constitutional: Elderly male sitting straight up in no acute distress at this time. Vitals:   11/17/17 2300 11/17/17 2315 11/17/17 2330 11/18/17 0015  BP: 108/67 93/65 101/63 106/68  Pulse:    80  Resp: 16  14 14   Temp:      TempSrc:      SpO2:    100%  Weight:      Height:       Eyes: PERRL, lids and conjunctivae normal ENMT: Mucous membranes are moist. Posterior pharynx clear of any exudate or lesions. Neck: normal, supple, no masses, no thyromegaly Respiratory: Decreased overall  aeration with some mild crackles appreciated.  No significant wheezing noted Cardiovascular: Regular rate and rhythm, no murmurs / rubs / gallops.  +1 pitting bilateral lower extremity edema. 2+ pedal pulses. No carotid bruits.  Abdomen: Protuberant abdomen with positive fluid wave, no masses palpated. No hepatosplenomegaly. Bowel sounds positive.  Musculoskeletal: no clubbing / cyanosis. No joint deformity upper and lower extremities. Good ROM, no contractures. Normal muscle tone.  Skin: no rashes, lesions, ulcers. No induration Neurologic: CN 2-12 grossly intact. Sensation intact, DTR normal. Strength 5/5 in all 4.  Psychiatric: Normal judgment and insight. Alert and oriented x 3. Normal mood.     Labs on Admission: I have personally reviewed following labs and imaging studies  CBC: Recent Labs  Lab 11/17/17 1831  WBC 7.1  HGB 13.7  HCT 40.3  MCV 89.4  PLT 279   Basic Metabolic Panel: Recent Labs  Lab 11/17/17 1831  NA 142  K 3.3*  CL 94*  CO2 34*  GLUCOSE 66  BUN 59*  CREATININE 1.90*  CALCIUM 9.4   GFR: Estimated Creatinine Clearance: 31.4 mL/min (A) (by C-G formula based on SCr of 1.9 mg/dL (H)). Liver Function Tests: Recent Labs  Lab 11/17/17 1831  AST 21  ALT 16*  ALKPHOS 70  BILITOT 0.8  PROT 7.5  ALBUMIN 3.8   Recent Labs  Lab 11/17/17 1831  LIPASE 30   No results for input(s): AMMONIA in the last 168 hours. Coagulation Profile: No results for input(s): INR, PROTIME in the last 168 hours. Cardiac Enzymes: No results for input(s): CKTOTAL, CKMB, CKMBINDEX, TROPONINI in the last 168 hours. BNP (last 3 results) No results for input(s): PROBNP in the last 8760 hours. HbA1C: No results for input(s): HGBA1C in the last 72 hours. CBG: Recent Labs  Lab 11/17/17 1954 11/17/17 2017  GLUCAP 69 79   Lipid Profile: No results for input(s): CHOL, HDL, LDLCALC, TRIG, CHOLHDL, LDLDIRECT in the last 72 hours. Thyroid Function Tests: No results for  input(s): TSH, T4TOTAL, FREET4, T3FREE, THYROIDAB in the last 72 hours. Anemia Panel: No results for input(s): VITAMINB12, FOLATE, FERRITIN, TIBC, IRON, RETICCTPCT in the last 72 hours. Urine analysis:    Component Value Date/Time   COLORURINE STRAW (A) 11/17/2017 2330   APPEARANCEUR CLEAR 11/17/2017 2330   LABSPEC 1.008 11/17/2017 2330   PHURINE 8.0 11/17/2017 2330   GLUCOSEU NEGATIVE 11/17/2017 2330   HGBUR NEGATIVE 11/17/2017 2330   BILIRUBINUR NEGATIVE 11/17/2017 2330   KETONESUR NEGATIVE 11/17/2017 2330   PROTEINUR NEGATIVE 11/17/2017 2330   UROBILINOGEN 1.0 08/03/2015 2035   NITRITE NEGATIVE 11/17/2017 2330   LEUKOCYTESUR NEGATIVE 11/17/2017 2330   Sepsis Labs: No results found for this or any previous visit (from the past 240 hour(s)).   Radiological Exams on Admission: Dg Abd Acute W/chest  Result Date: 11/17/2017 CLINICAL DATA:  Abdominal distention and shortness of breath. EXAM: DG ABDOMEN ACUTE W/ 1V CHEST COMPARISON:  01/19/2016 FINDINGS: Cardiac enlargement. Linear atelectasis or fibrosis in the left mid and lower lung. No airspace disease or consolidation. No pulmonary vascular congestion. No blunting of costophrenic angles. No pneumothorax. Calcification of the aorta. Mediastinal contours appear intact. Gas and stool throughout the colon. No small or large bowel distention. No free intraperitoneal air. No abnormal air-fluid levels. No radiopaque stones. Calcified granuloma in the right cardiophrenic angle. Surgical clips in the right upper quadrant. Degenerative changes in the spine and hips. Vascular calcifications. IMPRESSION: Cardiac enlargement.  No evidence of active pulmonary disease. Nonobstructive bowel gas pattern with scattered stool throughout the colon. Electronically Signed   By: Burman Nieves M.D.   On: 11/17/2017 21:36    EKG: Independently reviewed.  Suspected atrial fibrillation with QT prolongation  Assessment/Plan systolic congestive heart failure  exacerbation: Acute on chronic.  Patient presents with worsening abdominal and lower extremity swelling.  BNP elevated at 1047.1. Last EF noted to be somewhere around 20% back in 07/2016.  Patient was noted to have declined AICD placement. - Admit to a telemetry bed - Heart failure orders set  initiated  - Continuous pulse oximetry with nasal cannula oxygen as needed to keep O2 saturations >92% - Strict I&Os and daily weights - Elevate lower extremities - Lasix 80mg  IV bid - Reassess in a.m. and adjust diuresis as needed. - Check echocardiogram - Optimize medical management as able - Message sent for cardiology to eval in a.m.  Abdominal distention: Differential includes systolic congestive heart failure vs. constipation vs. other.  Hypoxia: Patient noted to be hypoxic into the 82% with ambulation likely secondary to CHF  exacerbation. - Continuous pulse oximetry with nasal cannula oxygen as needed - Physical therapy to ambulate  Elevated troponin, abnormal EKG: Initial troponin noted to be 0.06.  EKG showing possible atrial fibrillation with prolonged QTC of 575.  Patient denies any complaints of chest pain. - Continue to trend troponin - Follow-up telemetry  Hypoglycemia, diabetes mellitus type 2: Acute.  Initial glucose 66 on admission.  - Hypoglycemic protocol - Continue Lantus at half of home scheduled dose of 80 units daily - CBGs q. before meals and at bedtime with resistant sliding scale insulin - Continue gabapentin  Chronic kidney disease stage III: Patient presents with a creatinine 1.9 and BUN 59.  Symptoms could be related to hypoperfusion related to CHF.  Review of records on care everywhere shows that patient baseline creatinine has ranged from 1.2-1.96 over the last year.  - Recheck BMP in a.m.  Essential hypertension: Blood pressure is low normal on admission. - Continue Coreg as tolerated - Hold enalapril due to kidney function hold  CAD  - continue  Plavix  Hyperlipidemia - Continue atorvastatin   BPH - Check bladder scan - Continue Alfuzosin  DVT prophylaxis: lovenox  Code Status: DNR Family Communication: Discussed plan of care with patient and family present at bedside Disposition Plan: TBD  Consults called: none  Admission status: inpatient  Clydie Braun MD [facility] Pager [phone]   If 7PM-7AM, please contact night-coverage [email or web address] Password [email or web address]  11/18/2017, 1:35 AM

## 2017-11-18 NOTE — ED Provider Notes (Signed)
Care assumed from Dr. Anitra Lauth at shift change.  Patient with history of nonischemic cardiomyopathy, EF of 15% presenting with 3-day history of abdominal distention and exertional shortness of breath.  No chest pain, cough or fever.  Patient receiving IV Lasix and will be attempted to ambulate.  Patient becomes dyspneic with ambulation and O2 saturation dropped to 82%.  Denies chest pain.  Patient remains hypoxic and dyspneic even after IV diuresis.  Will admit for further evaluation. D/w Dr. Katrinka Blazing.   Stanley Octave, MD 11/18/17 [phone]

## 2017-11-18 NOTE — ED Notes (Signed)
Pt able to ambulate on the hallway with assistance using a walker SPO2 dropped to 82% on RA while ambulating.

## 2017-11-18 NOTE — ED Notes (Signed)
Rasmus Berardi to be call if the pt gets a room or for any concern with the pt phone [phone].

## 2017-11-18 NOTE — ED Notes (Signed)
Lunch Ordered °

## 2017-11-18 NOTE — Progress Notes (Signed)
ANTICOAGULATION CONSULT NOTE - Initial Consult  Pharmacy Consult for Apixaban Indication: atrial fibrillation  No Known Allergies  Patient Measurements: Height: 5\' 4"  (162.6 cm) Weight: 180 lb 14.4 oz (82.1 kg) IBW/kg (Calculated) : 59.2   Vital Signs: Temp: 97.9 F (36.6 C) (02/01 1630) Temp Source: Oral (02/01 1630) BP: 122/59 (02/01 1630) Pulse Rate: 112 (02/01 1630)  Labs: Recent Labs    11/17/17 1831 11/18/17 0310 11/18/17 0925  HGB 13.7 12.3*  --   HCT 40.3 37.9*  --   PLT 279 271  --   CREATININE 1.90*  --   --   TROPONINI  --  0.06* 0.06*    Estimated Creatinine Clearance: 31.5 mL/min (A) (by C-G formula based on SCr of 1.9 mg/dL (H)).   Medical History: Past Medical History:  Diagnosis Date  . CHF (congestive heart failure) (HCC)   . Diabetes mellitus without complication (HCC)   . Hypertension       Assessment: 77yom admitted for HF and new AFib RVR 120s.  Start amiodarone for rate control and apixaban for anticoagulation.  Stop clopidogrel to prevent increase risk bleeding.  Cr 1.9, Wt > 60kg, age < 80yo  Goal of Therapy:   Monitor platelets by anticoagulation protocol: Yes   Plan:  Apixaban 5mg  BID Monitor CBC, BMET and s/s bleeding  Leota Sauers Pharm.D. CPP, BCPS Clinical Pharmacist [phone] 11/18/2017 5:11 PM

## 2017-11-18 NOTE — Progress Notes (Signed)
[facility]                                                                              Patient Demographics  Stanley Farmer, is a 77 y.o. male, DOB - 1941-09-27, ZOX:[id number]  Admit date - 11/17/2017   Admitting Physician Clydie Braun, MD  Outpatient Primary MD for the patient is [name], MD  Outpatient specialists:   LOS - 0  days   Medical records reviewed and are as summarized below:    Chief Complaint  Patient presents with  . Abdominal Pain       Brief summary   Patient is a 77 year old Spanish-speaking male with a systolic CHF, EF 81%XBJYNWG cardiomyopathy, CKD stage III, DM type II, hyperlipidemia, and remote alcohol tobacco abuse presented with 3 days complaint of abdominal swelling, shortness of breath with exertion.  BNP 1047, hypoxic with O2 sats 82% with ambulation.  Creatinine 1.9.  Patient was admitted for further workup for CHF exacerbation.  Assessment & Plan    Principal Problem:   Acute on chronic systolic heart failure (HCC) -Patient presented with abdominal distention, lower extremity swelling, BNP elevated 1047. -Last EF 20% per echo in 10/17, apparently patient had declined AICD placement - repeat echo today  -Continue IV Lasix diuresis, strict I's and O's and daily weights  -Cardiology consulted  Active Problems: Mildly elevated troponin, abnormal EKG ? Afib -EKG showed with possible atrial fibrillation with PVCs, prolonged QTC 575, will await cardiology recommendations -Elevated likely due to acute on chronic systolic CHF, no chest pain currently    Essential hypertension -BP soft at the time of admission, hold enalapril due to renal insufficiency -For now continue Coreg as tolerated with BP    DM type 2 with diabetic peripheral neuropathy (HCC) -Presented with hypoglycemia glucose 66 on admission, continue Lantus at half dose, sliding scale insulin -Follow hemoglobin A1c    CKD (chronic kidney disease) stage  III -Baseline creatinine 1.2-1.9 over the last year currently at baseline -Follow closely with diuresis     BPH (benign prostatic hyperplasia) -Continue alfuzosin  CAD -Continue Plavix, statin  Hyperlipidemia -Continue atorvastatin  Code Status: full  DVT Prophylaxis:  Lovenox Family Communication: Discussed in detail with the patient, all imaging results, lab results explained to the patient    Disposition Plan:   Time Spent in minutes  35 minutes  Procedures:    Consultants:   Cardiology  Antimicrobials:      Medications  Scheduled Meds: . alfuzosin  10 mg Oral Daily  . atorvastatin  40 mg Oral QHS  . carvedilol  3.125 mg Oral BID WC  . clopidogrel  75 mg Oral Daily  . enoxaparin (LOVENOX) injection  40 mg Subcutaneous Daily  . famotidine  20 mg Oral BID  . furosemide  80 mg Intravenous BID  . gabapentin  300 mg Oral TID  . insulin aspart  0-20 Units Subcutaneous TID WC  . insulin glargine  40 Units Subcutaneous q morning - 10a  . polyethylene glycol  17 g Oral BID  . potassium chloride SA  40 mEq Oral Daily  . sodium chloride flush  3 mL Intravenous Q12H   Continuous Infusions: . sodium chloride     PRN Meds:.sodium chloride, acetaminophen, bisacodyl, ondansetron (ZOFRAN) IV, sodium chloride flush   Antibiotics   Anti-infectives (From admission, onward)   None        Subjective:   [name] was seen and examined today.  Feels slightly better today, still feels abdominal swelling.  Peripheral edema improving.  No chest pain.  Shortness of breath is improving.  Patient denies dizziness, chest pain,  abdominal pain, N/V/D/C, new weakness, numbess, tingling.   Objective:   Vitals:   11/18/17 0711 11/18/17 0730 11/18/17 0800 11/18/17 0830  BP:  97/66 95/60 104/81  Pulse:  79 97 92  Resp:  17 17 17   Temp:      TempSrc:      SpO2: 96% (!) 89% 99% 95%  Weight:      Height:        Intake/Output Summary (Last 24 hours) at 11/18/2017  1137 Last data filed at 11/18/2017 0944 Gross per 24 hour  Intake -  Output 1975 ml  Net -1975 ml     Wt Readings from Last 3 Encounters:  11/17/17 81.6 kg (180 lb)  01/17/16 81.8 kg (180 lb 4.8 oz)  08/03/15 87.2 kg (192 lb 5 oz)     Exam  General: Alert and oriented x 3, NAD  Eyes:   HEENT:  JVD +  Cardiovascular: S1 S2 auscultated, Regular rate and rhythm.  Respiratory: Diminished breath sounds, bibasilar crackles  Gastrointestinal: Soft, nontender, distended, + bowel sounds  Ext: 1+pedal edema bilaterally  Neuro: AAOx3, Cr N's II- XII. Strength 5/5 upper and lower extremities bilaterally, speech clear, sensations grossly intact  Musculoskeletal: No digital cyanosis, clubbing  Skin: No rashes  Psych: Normal affect and demeanor, alert and oriented x3    Data Reviewed:  I have personally reviewed following labs and imaging studies  Micro Results No results found for this or any previous visit (from the past 240 hour(s)).  Radiology Reports Dg Abd Acute W/chest  Result Date: 11/17/2017 CLINICAL DATA:  Abdominal distention and shortness of breath. EXAM: DG ABDOMEN ACUTE W/ 1V CHEST COMPARISON:  01/19/2016 FINDINGS: Cardiac enlargement. Linear atelectasis or fibrosis in the left mid and lower lung. No airspace disease or consolidation. No pulmonary vascular congestion. No blunting of costophrenic angles. No pneumothorax. Calcification of the aorta. Mediastinal contours appear intact. Gas and stool throughout the colon. No small or large bowel distention. No free intraperitoneal air. No abnormal air-fluid levels. No radiopaque stones. Calcified granuloma in the right cardiophrenic angle. Surgical clips in the right upper quadrant. Degenerative changes in the spine and hips. Vascular calcifications. IMPRESSION: Cardiac enlargement.  No evidence of active pulmonary disease. Nonobstructive bowel gas pattern with scattered stool throughout the colon. Electronically Signed    By: Burman Nieves M.D.   On: 11/17/2017 21:36    Lab Data:  CBC: Recent Labs  Lab 11/17/17 1831 11/18/17 0310  WBC 7.1 6.3  NEUTROABS  --  3.6  HGB 13.7 12.3*  HCT 40.3 37.9*  MCV 89.4 90.5  PLT 279 271   Basic Metabolic Panel: Recent Labs  Lab 11/17/17 1831 11/18/17 0310  NA 142  --   K 3.3*  --   CL 94*  --   CO2 34*  --   GLUCOSE 66  --   BUN 59*  --   CREATININE 1.90*  --   CALCIUM 9.4  --   MG  --  2.5*  GFR: Estimated Creatinine Clearance: 31.4 mL/min (A) (by C-G formula based on SCr of 1.9 mg/dL (H)). Liver Function Tests: Recent Labs  Lab 11/17/17 1831  AST 21  ALT 16*  ALKPHOS 70  BILITOT 0.8  PROT 7.5  ALBUMIN 3.8   Recent Labs  Lab 11/17/17 1831  LIPASE 30   No results for input(s): AMMONIA in the last 168 hours. Coagulation Profile: No results for input(s): INR, PROTIME in the last 168 hours. Cardiac Enzymes: Recent Labs  Lab 11/18/17 0310 11/18/17 0925  TROPONINI 0.06* 0.06*   BNP (last 3 results) No results for input(s): PROBNP in the last 8760 hours. HbA1C: No results for input(s): HGBA1C in the last 72 hours. CBG: Recent Labs  Lab 11/17/17 1954 11/17/17 2017 11/18/17 0157 11/18/17 0809  GLUCAP 69 79 97 175*   Lipid Profile: No results for input(s): CHOL, HDL, LDLCALC, TRIG, CHOLHDL, LDLDIRECT in the last 72 hours. Thyroid Function Tests: No results for input(s): TSH, T4TOTAL, FREET4, T3FREE, THYROIDAB in the last 72 hours. Anemia Panel: No results for input(s): VITAMINB12, FOLATE, FERRITIN, TIBC, IRON, RETICCTPCT in the last 72 hours. Urine analysis:    Component Value Date/Time   COLORURINE STRAW (A) 11/17/2017 2330   APPEARANCEUR CLEAR 11/17/2017 2330   LABSPEC 1.008 11/17/2017 2330   PHURINE 8.0 11/17/2017 2330   GLUCOSEU NEGATIVE 11/17/2017 2330   HGBUR NEGATIVE 11/17/2017 2330   BILIRUBINUR NEGATIVE 11/17/2017 2330   KETONESUR NEGATIVE 11/17/2017 2330   PROTEINUR NEGATIVE 11/17/2017 2330   UROBILINOGEN  1.0 08/03/2015 2035   NITRITE NEGATIVE 11/17/2017 2330   LEUKOCYTESUR NEGATIVE 11/17/2017 2330     Ripudeep Rai M.D. [facility] 11/18/2017, 11:37 AM  Pager: [phone] Between 7am to 7pm - call Pager - [phone]  After 7pm go to [email or web address] - password [email or web address]  Call night coverage person covering after 7pm

## 2017-11-19 ENCOUNTER — Encounter (HOSPITAL_COMMUNITY): Payer: Self-pay

## 2017-11-19 ENCOUNTER — Inpatient Hospital Stay (HOSPITAL_COMMUNITY): Payer: Medicare Other

## 2017-11-19 DIAGNOSIS — N179 Acute kidney failure, unspecified: Secondary | ICD-10-CM

## 2017-11-19 LAB — COOXEMETRY PANEL
Carboxyhemoglobin: 0.9 % (ref 0.5–1.5)
Carboxyhemoglobin: 1.3 % (ref 0.5–1.5)
METHEMOGLOBIN: 0.8 % (ref 0.0–1.5)
Methemoglobin: 0.6 % (ref 0.0–1.5)
O2 Saturation: 33.4 %
O2 Saturation: 51.8 %
TOTAL HEMOGLOBIN: 12.2 g/dL (ref 12.0–16.0)
Total hemoglobin: 13.2 g/dL (ref 12.0–16.0)

## 2017-11-19 LAB — BASIC METABOLIC PANEL
Anion gap: 13 (ref 5–15)
BUN: 62 mg/dL — AB (ref 6–20)
CO2: 32 mmol/L (ref 22–32)
Calcium: 9.1 mg/dL (ref 8.9–10.3)
Chloride: 96 mmol/L — ABNORMAL LOW (ref 101–111)
Creatinine, Ser: 2.04 mg/dL — ABNORMAL HIGH (ref 0.61–1.24)
GFR calc Af Amer: 34 mL/min — ABNORMAL LOW (ref >60)
GFR, EST NON AFRICAN AMERICAN: 30 mL/min — AB (ref >60)
GLUCOSE: 107 mg/dL — AB (ref 65–99)
POTASSIUM: 3.2 mmol/L — AB (ref 3.5–5.1)
SODIUM: 141 mmol/L (ref 135–145)

## 2017-11-19 LAB — GLUCOSE, CAPILLARY
GLUCOSE-CAPILLARY: 147 mg/dL — AB (ref 65–99)
GLUCOSE-CAPILLARY: 101 mg/dL — AB (ref 65–99)
Glucose-Capillary: 171 mg/dL — ABNORMAL HIGH (ref 65–99)
Glucose-Capillary: 172 mg/dL — ABNORMAL HIGH (ref 65–99)

## 2017-11-19 LAB — MRSA PCR SCREENING: MRSA by PCR: NEGATIVE

## 2017-11-19 MED ORDER — POTASSIUM CHLORIDE CRYS ER 20 MEQ PO TBCR
40.0000 meq | EXTENDED_RELEASE_TABLET | Freq: Once | ORAL | Status: AC
  Administered 2017-11-19: 40 meq via ORAL
  Filled 2017-11-19: qty 2

## 2017-11-19 MED ORDER — FAMOTIDINE 20 MG PO TABS
20.0000 mg | ORAL_TABLET | Freq: Every day | ORAL | Status: DC
  Administered 2017-11-20 – 2017-11-27 (×8): 20 mg via ORAL
  Filled 2017-11-19 (×8): qty 1

## 2017-11-19 MED ORDER — SODIUM CHLORIDE 0.9% FLUSH
10.0000 mL | INTRAVENOUS | Status: DC | PRN
  Administered 2017-11-25: 10 mL
  Filled 2017-11-19: qty 40

## 2017-11-19 MED ORDER — MILRINONE LACTATE IN DEXTROSE 20-5 MG/100ML-% IV SOLN
0.3750 ug/kg/min | INTRAVENOUS | Status: DC
  Administered 2017-11-19: 0.25 ug/kg/min via INTRAVENOUS
  Administered 2017-11-20: 0.375 ug/kg/min via INTRAVENOUS
  Administered 2017-11-20: 0.25 ug/kg/min via INTRAVENOUS
  Administered 2017-11-21 – 2017-11-23 (×3): 0.375 ug/kg/min via INTRAVENOUS
  Filled 2017-11-19 (×8): qty 100

## 2017-11-19 NOTE — Evaluation (Signed)
Physical Therapy Evaluation Patient Details Name: Stanley Farmer MRN: [id number] DOB: December 03, 1940 Today's Date: 11/19/2017   History of Present Illness  Pt is a 77 y.o. Spanish-speaking male admitted 11/17/17 with abdominal pain, swelling, and SOB; worked up for CHF exacerbation. PMH includes CHF, CKD III, DM, HLD.    Clinical Impression  Pt presents with an overall decrease in functional mobility secondary to above. PTA, pt mod indep with RW; lives with wife who assists him due to vision and hearing impairments. Today, able to amb short distance with RW and supervision for safety. Demonstrates decreased activity tolerance and balance. Pt would benefit from continued acute PT services to maximize functional mobility and independence prior to d/c with HHPT services.     Follow Up Recommendations Home health PT;Supervision/Assistance - 24 hour    Equipment Recommendations  None recommended by PT    Recommendations for Other Services       Precautions / Restrictions Precautions Precautions: Fall Restrictions Weight Bearing Restrictions: No      Mobility  Bed Mobility               General bed mobility comments: Received sitting EOB  Transfers Overall transfer level: Needs assistance Equipment used: Rolling walker (2 wheeled) Transfers: Sit to/from Stand Sit to Stand: Supervision            Ambulation/Gait Ambulation/Gait assistance: Supervision Ambulation Distance (Feet): 20 Feet Assistive device: Rolling walker (2 wheeled) Gait Pattern/deviations: Step-to pattern;Trunk flexed Gait velocity: Decreased Gait velocity interpretation: <1.8 ft/sec, indicative of risk for recurrent falls General Gait Details: Slow, controlled amb with RW and supervision for safety. Cues for direction as pt has decreased vision. Per wife, she helps guide him at home  Stairs            Wheelchair Mobility    Modified Rankin (Stroke Patients Only)       Balance Overall  balance assessment: Needs assistance   Sitting balance-Leahy Scale: Good       Standing balance-Leahy Scale: Poor Standing balance comment: Reliant on UE support                             Pertinent Vitals/Pain Pain Assessment: Faces Faces Pain Scale: Hurts a little bit Pain Location: abdomen Pain Descriptors / Indicators: Sore Pain Intervention(s): Monitored during session    Home Living Family/patient expects to be discharged to:: Private residence Living Arrangements: Spouse/significant other Available Help at Discharge: Family;Available 24 hours/day Type of Home: Mobile home Home Access: Stairs to enter Entrance Stairs-Rails: Can reach both Entrance Stairs-Number of Steps: 4 Home Layout: One level Home Equipment: Walker - 2 wheels;Cane - single point      Prior Function Level of Independence: Independent with assistive device(s)         Comments: Pt mod indep with RW. Drives     Hand Dominance        Extremity/Trunk Assessment   Upper Extremity Assessment Upper Extremity Assessment: Generalized weakness    Lower Extremity Assessment Lower Extremity Assessment: Generalized weakness       Communication   Communication: Interpreter utilized  Cognition Arousal/Alertness: Awake/alert Behavior During Therapy: Flat affect Overall Cognitive Status: Difficult to assess                                 General Comments: Per family present, reports pt does not see or hear well  General Comments General comments (skin integrity, edema, etc.): Wife and son present throughout session    Exercises     Assessment/Plan    PT Assessment Patient needs continued PT services  PT Problem List Decreased activity tolerance;Decreased balance;Decreased mobility       PT Treatment Interventions DME instruction;Gait training;Stair training;Functional mobility training;Therapeutic activities;Therapeutic exercise;Balance  training;Patient/family education    PT Goals (Current goals can be found in the Care Plan section)  Acute Rehab PT Goals Patient Stated Goal: Return home PT Goal Formulation: With patient Time For Goal Achievement: 12/03/17 Potential to Achieve Goals: Good    Frequency Min 3X/week   Barriers to discharge        Co-evaluation               AM-PAC PT "6 Clicks" Daily Activity  Outcome Measure Difficulty turning over in bed (including adjusting bedclothes, sheets and blankets)?: None Difficulty moving from lying on back to sitting on the side of the bed? : A Little Difficulty sitting down on and standing up from a chair with arms (e.g., wheelchair, bedside commode, etc,.)?: A Little Help needed moving to and from a bed to chair (including a wheelchair)?: A Little Help needed walking in hospital room?: A Little Help needed climbing 3-5 steps with a railing? : A Little 6 Click Score: 19    End of Session   Activity Tolerance: Patient tolerated treatment well;Patient limited by fatigue Patient left: with call bell/phone within reach;in bed;with family/visitor present Nurse Communication: Mobility status PT Visit Diagnosis: Other abnormalities of gait and mobility (R26.89)    Time: 1110-1130 PT Time Calculation (min) (ACUTE ONLY): 20 min   Charges:   PT Evaluation $PT Eval Moderate Complexity: 1 Mod     PT G Codes:       Ina Homes, PT, DPT Acute Rehab Services  Pager: [phone]  Malachy Chamber 11/19/2017, 12:29 PM

## 2017-11-19 NOTE — Progress Notes (Signed)
picc team at bedside.  Will administer lasix post-procedure

## 2017-11-19 NOTE — Progress Notes (Signed)
[facility]                                                                              Patient Demographics  Stanley Farmer, is a 77 y.o. male, DOB - 05-11-1941, ZOX:[id number]  Admit date - 11/17/2017   Admitting Physician Clydie Braun, MD  Outpatient Primary MD for the patient is [name], MD  Outpatient specialists:   LOS - 1  days   Medical records reviewed and are as summarized below:    Chief Complaint  Patient presents with  . Abdominal Pain       Brief summary   Patient is a 77 year old Spanish-speaking male with a systolic CHF, EF 81%XBJYNWG cardiomyopathy, CKD stage III, DM type II, hyperlipidemia, and remote alcohol tobacco abuse presented with 3 days complaint of abdominal swelling, shortness of breath with exertion.  BNP 1047, hypoxic with O2 sats 82% with ambulation.  Creatinine 1.9.  Patient was admitted for further workup for CHF exacerbation.  Assessment & Plan    Principal Problem:   Acute on chronic combined systolic and diastolic heart failure (HCC) -Patient presented with abdominal distention, lower extremity swelling, BNP elevated 1047. -Last EF 20% per echo in 10/17, apparently patient had declined AICD placement -Continue IV Lasix diuresis, negative balance of 1.5 L, creatinine trended up to 2.0 -CHF team following, 2D echo showed EF of 10% with diffuse hypokinesis -Hold Coreg, No ACE/ARB due to renal insufficiency -CHF team recommending patient transferred to heart failure floor, may need inotropes -Patient was started on amiodarone and possible TEE/DCCV early next week  Active Problems: Atrial fibrillation- new onset -EKG on admission showed atrial fibrillation with PVCs, prolonged QTC 575 -Patient started on amiodarone for rate control.  BP soft and with acute CHF, difficult to place on Cardizem or beta-blocker -Started on eliquis, Chads vasc 8 -Per cardiology, if no improvement on amiodarone, TEE/DCCV next week    Essential hypertension -BP soft, hold Coreg, enalapril (no ACE/ARB secondary to renal insufficiency)    DM type 2 with diabetic peripheral neuropathy (HCC) -Presented with hypoglycemia glucose 66 on admission, continue Lantus at half dose, sliding scale insulin -Follow hemoglobin A1c    CKD (chronic kidney disease) stage III -Baseline creatinine 1.2-1.9 over the last year. -Pending trending up with diuresis, 2.0    BPH (benign prostatic hyperplasia) -Continue alfuzosin  CAD -Continue Plavix, statin  Hyperlipidemia -Continue atorvastatin  Code Status: full  DVT Prophylaxis:  Lovenox Family Communication: Discussed in detail with the patient, all imaging results, lab results explained to the patient    Disposition Plan:   Time Spent in minutes 25 minutes  Procedures:    Consultants:   Cardiology  Antimicrobials:      Medications  Scheduled Meds: . alfuzosin  10 mg Oral Daily  . apixaban  5 mg Oral BID  . atorvastatin  40 mg Oral QHS  . famotidine  20 mg Oral BID  . furosemide  80 mg Intravenous BID  . gabapentin  300 mg Oral TID  . insulin aspart  0-20 Units Subcutaneous TID WC  . insulin glargine  40 Units Subcutaneous q morning -  10a  . polyethylene glycol  17 g Oral BID  . potassium chloride SA  40 mEq Oral Daily  . sodium chloride flush  3 mL Intravenous Q12H   Continuous Infusions: . sodium chloride    . amiodarone 30 mg/hr (11/19/17 0737)   PRN Meds:.sodium chloride, acetaminophen, bisacodyl, ondansetron (ZOFRAN) IV, sodium chloride flush   Antibiotics   Anti-infectives (From admission, onward)   None        Subjective:   Stanley Farmer was seen and examined today.  States feels the same, still has shortness of breath.  Abdomen still distended.  Patient denies dizziness, chest pain,  abdominal pain, N/V/D/C, new weakness, numbess, tingling.   Objective:   Vitals:   11/18/17 1630 11/18/17 1918 11/19/17 0010 11/19/17 0444  BP: (!)  122/59 121/68 (!) 93/50 (!) 107/58  Pulse: (!) 112 92 76 81  Resp: 18 18 18 18   Temp: 97.9 F (36.6 C) 98.5 F (36.9 C) 98 F (36.7 C) (!) 97.5 F (36.4 C)  TempSrc: Oral Oral Oral Oral  SpO2: 98% 97% 98% 95%  Weight: 82.1 kg (180 lb 14.4 oz)   81.5 kg (179 lb 9.6 oz)  Height:        Intake/Output Summary (Last 24 hours) at 11/19/2017 1135 Last data filed at 11/19/2017 8867 Gross per 24 hour  Intake 914.05 ml  Output 500 ml  Net 414.05 ml     Wt Readings from Last 3 Encounters:  11/19/17 81.5 kg (179 lb 9.6 oz)  01/17/16 81.8 kg (180 lb 4.8 oz)  08/03/15 87.2 kg (192 lb 5 oz)     Exam General: Alert and oriented x 3, NAD Eyes:  HEENT: JVP + Cardiovascular: Irregularly irregular Respiratory: Clear to auscultation bilaterally, no wheezing, rales or rhonchi Gastrointestinal: Soft, nontender, nondistended, + bowel sounds Ext: trace pedal edema bilaterally Neuro: no new deficits Musculoskeletal: No digital cyanosis, clubbing Skin: No rashes Psych: Normal affect and demeanor, alert and oriented x3      Data Reviewed:  I have personally reviewed following labs and imaging studies  Micro Results No results found for this or any previous visit (from the past 240 hour(s)).  Radiology Reports Dg Abd Acute W/chest  Result Date: 11/17/2017 CLINICAL DATA:  Abdominal distention and shortness of breath. EXAM: DG ABDOMEN ACUTE W/ 1V CHEST COMPARISON:  01/19/2016 FINDINGS: Cardiac enlargement. Linear atelectasis or fibrosis in the left mid and lower lung. No airspace disease or consolidation. No pulmonary vascular congestion. No blunting of costophrenic angles. No pneumothorax. Calcification of the aorta. Mediastinal contours appear intact. Gas and stool throughout the colon. No small or large bowel distention. No free intraperitoneal air. No abnormal air-fluid levels. No radiopaque stones. Calcified granuloma in the right cardiophrenic angle. Surgical clips in the right upper  quadrant. Degenerative changes in the spine and hips. Vascular calcifications. IMPRESSION: Cardiac enlargement.  No evidence of active pulmonary disease. Nonobstructive bowel gas pattern with scattered stool throughout the colon. Electronically Signed   By: Burman Nieves M.D.   On: 11/17/2017 21:36    Lab Data:  CBC: Recent Labs  Lab 11/17/17 1831 11/18/17 0310  WBC 7.1 6.3  NEUTROABS  --  3.6  HGB 13.7 12.3*  HCT 40.3 37.9*  MCV 89.4 90.5  PLT 279 271   Basic Metabolic Panel: Recent Labs  Lab 11/17/17 1831 11/18/17 0310 11/19/17 0412  NA 142  --  141  K 3.3*  --  3.2*  CL 94*  --  96*  CO2  34*  --  32  GLUCOSE 66  --  107*  BUN 59*  --  62*  CREATININE 1.90*  --  2.04*  CALCIUM 9.4  --  9.1  MG  --  2.5*  --    GFR: Estimated Creatinine Clearance: 29.2 mL/min (A) (by C-G formula based on SCr of 2.04 mg/dL (H)). Liver Function Tests: Recent Labs  Lab 11/17/17 1831  AST 21  ALT 16*  ALKPHOS 70  BILITOT 0.8  PROT 7.5  ALBUMIN 3.8   Recent Labs  Lab 11/17/17 1831  LIPASE 30   No results for input(s): AMMONIA in the last 168 hours. Coagulation Profile: No results for input(s): INR, PROTIME in the last 168 hours. Cardiac Enzymes: Recent Labs  Lab 11/18/17 0310 11/18/17 0925 11/18/17 1651  TROPONINI 0.06* 0.06* 0.06*   BNP (last 3 results) No results for input(s): PROBNP in the last 8760 hours. HbA1C: No results for input(s): HGBA1C in the last 72 hours. CBG: Recent Labs  Lab 11/18/17 1201 11/18/17 1417 11/18/17 1712 11/18/17 2035 11/19/17 0727  GLUCAP 95 193* 234* 221* 171*   Lipid Profile: No results for input(s): CHOL, HDL, LDLCALC, TRIG, CHOLHDL, LDLDIRECT in the last 72 hours. Thyroid Function Tests: No results for input(s): TSH, T4TOTAL, FREET4, T3FREE, THYROIDAB in the last 72 hours. Anemia Panel: No results for input(s): VITAMINB12, FOLATE, FERRITIN, TIBC, IRON, RETICCTPCT in the last 72 hours. Urine analysis:    Component Value  Date/Time   COLORURINE STRAW (A) 11/17/2017 2330   APPEARANCEUR CLEAR 11/17/2017 2330   LABSPEC 1.008 11/17/2017 2330   PHURINE 8.0 11/17/2017 2330   GLUCOSEU NEGATIVE 11/17/2017 2330   HGBUR NEGATIVE 11/17/2017 2330   BILIRUBINUR NEGATIVE 11/17/2017 2330   KETONESUR NEGATIVE 11/17/2017 2330   PROTEINUR NEGATIVE 11/17/2017 2330   UROBILINOGEN 1.0 08/03/2015 2035   NITRITE NEGATIVE 11/17/2017 2330   LEUKOCYTESUR NEGATIVE 11/17/2017 2330     Ripudeep Rai M.D. [facility] 11/19/2017, 11:35 AM  Pager: [phone] Between 7am to 7pm - call Pager - [phone]  After 7pm go to [email or web address] - password [email or web address]  Call night coverage person covering after 7pm

## 2017-11-19 NOTE — Progress Notes (Addendum)
Pt to be transferred. 2c10. Receiving RN informed of CBG not crossing over, and fear of peaking rapid insulin with previously received long acting; therefore no fast acting administered for lunch coverage.

## 2017-11-19 NOTE — Progress Notes (Signed)
Advanced Heart Failure Rounding Note   Subjective:     Remains on IV lasix. Started on IV amio and Eliquis yesterday for AF.   Says he feels ok. But breathing just so-so. Wants to go home because he doesn't like the food. Denies CP. Remains in AF in 90s. Weight down 1 pound. Creatinine up 1.9-> 2.0 (baseline 1.4-1.5)   Objective:   Weight Range:  Vital Signs:   Temp:  [97.5 F (36.4 C)-98.5 F (36.9 C)] 97.5 F (36.4 C) (02/02 0444) Pulse Rate:  [60-112] 81 (02/02 0444) Resp:  [16-18] 18 (02/02 0444) BP: (93-123)/(50-85) 107/58 (02/02 0444) SpO2:  [95 %-98 %] 95 % (02/02 0444) Weight:  [81.5 kg (179 lb 9.6 oz)-82.1 kg (180 lb 14.4 oz)] 81.5 kg (179 lb 9.6 oz) (02/02 0444) Last BM Date: 11/18/17  Weight change: Filed Weights   11/17/17 2034 11/18/17 1630 11/19/17 0444  Weight: 81.6 kg (180 lb) 82.1 kg (180 lb 14.4 oz) 81.5 kg (179 lb 9.6 oz)    Intake/Output:   Intake/Output Summary (Last 24 hours) at 11/19/2017 0829 Last data filed at 11/19/2017 0600 Gross per 24 hour  Intake 615.32 ml  Output 775 ml  Net -159.68 ml     Physical Exam: General: Sitting up in bed weak appearing. No resp difficulty HEENT: normal Neck: supple. JVP 9-10 . Carotids 2+ bilat; no bruits. No lymphadenopathy or thryomegaly appreciated. Cor: PMI laterally displaced. Irregular rate & rhythm. 3/6 MR +s3 Lungs: clear Abdomen: soft, nontender, + distended. No hepatosplenomegaly. No bruits or masses. Good bowel sounds. Extremities: no cyanosis, clubbing, rash, trace edema, cool  Neuro: alert & orientedx3, cranial nerves grossly intact. moves all 4 extremities w/o difficulty. Affect pleasant  Telemetry: AF 90s Personally reviewed   Labs: Basic Metabolic Panel: Recent Labs  Lab 11/17/17 1831 11/18/17 0310 11/19/17 0412  NA 142  --  141  K 3.3*  --  3.2*  CL 94*  --  96*  CO2 34*  --  32  GLUCOSE 66  --  107*  BUN 59*  --  62*  CREATININE 1.90*  --  2.04*  CALCIUM 9.4  --  9.1  MG   --  2.5*  --     Liver Function Tests: Recent Labs  Lab 11/17/17 1831  AST 21  ALT 16*  ALKPHOS 70  BILITOT 0.8  PROT 7.5  ALBUMIN 3.8   Recent Labs  Lab 11/17/17 1831  LIPASE 30   No results for input(s): AMMONIA in the last 168 hours.  CBC: Recent Labs  Lab 11/17/17 1831 11/18/17 0310  WBC 7.1 6.3  NEUTROABS  --  3.6  HGB 13.7 12.3*  HCT 40.3 37.9*  MCV 89.4 90.5  PLT 279 271    Cardiac Enzymes: Recent Labs  Lab 11/18/17 0310 11/18/17 0925 11/18/17 1651  TROPONINI 0.06* 0.06* 0.06*    BNP: BNP (last 3 results) Recent Labs    11/17/17 1831  BNP 1,047.1*    ProBNP (last 3 results) No results for input(s): PROBNP in the last 8760 hours.    Other results:  Imaging: Dg Abd Acute W/chest  Result Date: 11/17/2017 CLINICAL DATA:  Abdominal distention and shortness of breath. EXAM: DG ABDOMEN ACUTE W/ 1V CHEST COMPARISON:  01/19/2016 FINDINGS: Cardiac enlargement. Linear atelectasis or fibrosis in the left mid and lower lung. No airspace disease or consolidation. No pulmonary vascular congestion. No blunting of costophrenic angles. No pneumothorax. Calcification of the aorta. Mediastinal contours appear intact. Gas  and stool throughout the colon. No small or large bowel distention. No free intraperitoneal air. No abnormal air-fluid levels. No radiopaque stones. Calcified granuloma in the right cardiophrenic angle. Surgical clips in the right upper quadrant. Degenerative changes in the spine and hips. Vascular calcifications. IMPRESSION: Cardiac enlargement.  No evidence of active pulmonary disease. Nonobstructive bowel gas pattern with scattered stool throughout the colon. Electronically Signed   By: Burman Nieves M.D.   On: 11/17/2017 21:36      Medications:     Scheduled Medications: . alfuzosin  10 mg Oral Daily  . apixaban  5 mg Oral BID  . atorvastatin  40 mg Oral QHS  . famotidine  20 mg Oral BID  . furosemide  80 mg Intravenous BID  .  gabapentin  300 mg Oral TID  . insulin aspart  0-20 Units Subcutaneous TID WC  . insulin glargine  40 Units Subcutaneous q morning - 10a  . polyethylene glycol  17 g Oral BID  . potassium chloride SA  40 mEq Oral Daily  . sodium chloride flush  3 mL Intravenous Q12H     Infusions: . sodium chloride    . amiodarone 30 mg/hr (11/19/17 0737)     PRN Medications:  sodium chloride, acetaminophen, bisacodyl, ondansetron (ZOFRAN) IV, sodium chloride flush   Assessment:    Dempsey Pinedais a 77 y.o.Spanish speakingmalewith medical history significantfor combined HF, NICM, EF 10-15%, with moderately reduced RV function on 01/16/16. PMH also includes CKD stage III, DM type II, hyperlipidemia, mild cognitive impairment, and remote alcohol tobacco abuse.      Plan/Discussion:     1. Acute on Chronic Combined HF, Unclear Etiology, most recent echo on file: EF 15-20%, Grade II DD, decreased RV function - Long standing CMP, no h/o of cath, but Nuclear stress 2011 with fixed defect and no ischemia - Echo 07/2016 20%. Echo 11/18/17 shows EF 10%. With severe RV dysfunction  - Fluid status remains elevated on exam. Creatinine worsening and extremities are cool. Suspect low output shock physiology in setting of AF on top of severe cardiomyopathy - Will move to 2C or 4E and place PICC to follow CVP and co-ox - Hold coreg with decompensation. No ACE/ARB with AKI - Will need inotropes - Will need to discuss R/L Cath with him if renal function imprpvoes.  - Continue amio. Plan TEE/DC-CV early next week - Has wide LBBB may benefit from CRT if not too late  2. Atrial fibrillation - Unclear chronicity. Likely related to his decompensation.  - Most recent EKGs in chart in April/May 2017 show NSR.  - CHADSVaSc is 8. Started Eliquis 2/1 - Will use amiodarone gtt for rate control.  We are aware of risk of stroke, but pt is a poor candidate to other rate control agents such as BB with severe HF.    - If does not improve on amiodarone, will attempt TEE/DCCV early next week  3. Hypermagnesemia - Continue diuretics as above.   3. Elevated troponin - 0.06. Flat trend. No ACS. Likely due to HF - Denies chest pain.  4. Acute on CKD III - Cr 2.0 today (baseline 1.40-1.5) - Continue daily BMET.  5. H/o CVA - With cognitive deficits.   - Continue statin. Eliquis started.    Length of Stay: 1   [name] MD 11/19/2017, 8:29 AM  Advanced Heart Failure Team Pager [phone] (M-F; 7a - 4p)  Please contact [facility] for night-coverage after hours (4p -7a ) and weekends  on [email or web address]

## 2017-11-19 NOTE — Progress Notes (Addendum)
Patient's wife reports him as a former smoker, but not currently.

## 2017-11-19 NOTE — Progress Notes (Signed)
Peripherally Inserted Central Catheter/Midline Placement  The IV Nurse has discussed with the patient and/or persons authorized to consent for the patient, the purpose of this procedure and the potential benefits and risks involved with this procedure.  The benefits include less needle sticks, lab draws from the catheter, and the patient may be discharged home with the catheter. Risks include, but not limited to, infection, bleeding, blood clot (thrombus formation), and puncture of an artery; nerve damage and irregular heartbeat and possibility to perform a PICC exchange if needed/ordered by physician.  Alternatives to this procedure were also discussed.  Bard Power PICC patient education guide, fact sheet on infection prevention and patient information card has been provided to patient /or left at bedside. Patient, wife and son at bedside with translator. Discussed PICC for approximately an hour. All questions answered to their satisfaction. Patient unable to sign name and indicated acceptance with an 'X' signature. Gave verbal and implied consent to this author, Chari Manning, RN, and translator. Family also agreeable.   PICC/Midline Placement Documentation  PICC Triple Lumen 11/19/17 PICC Right Brachial 37 cm 0 cm (Active)  Indication for Insertion or Continuance of Line Vasoactive infusions 11/19/2017  1:43 PM  Exposed Catheter (cm) 0 cm 11/19/2017  1:43 PM  Site Assessment Clean;Dry;Intact 11/19/2017  1:43 PM  Lumen #1 Status Flushed;Blood return noted;Saline locked 11/19/2017  1:43 PM  Lumen #2 Status Flushed;Blood return noted;Saline locked 11/19/2017  1:43 PM  Lumen #3 Status Blood return noted;Flushed;Saline locked 11/19/2017  1:43 PM  Dressing Type Transparent 11/19/2017  1:43 PM  Dressing Status Clean;Dry;Intact;Antimicrobial disc in place 11/19/2017  1:43 PM  Line Care Connections checked and tightened 11/19/2017  1:43 PM  Line Adjustment (NICU/IV Team Only) No 11/19/2017  1:43 PM  Dressing Intervention New  dressing 11/19/2017  1:43 PM  Dressing Change Due 11/26/17 11/19/2017  1:43 PM       Morrell Riddle, Arnetha Massy 11/19/2017, 1:45 PM

## 2017-11-20 DIAGNOSIS — N179 Acute kidney failure, unspecified: Secondary | ICD-10-CM

## 2017-11-20 DIAGNOSIS — R570 Cardiogenic shock: Secondary | ICD-10-CM

## 2017-11-20 DIAGNOSIS — I480 Paroxysmal atrial fibrillation: Secondary | ICD-10-CM

## 2017-11-20 HISTORY — DX: Acute kidney failure, unspecified: N17.9

## 2017-11-20 HISTORY — DX: Paroxysmal atrial fibrillation: I48.0

## 2017-11-20 LAB — BASIC METABOLIC PANEL
Anion gap: 13 (ref 5–15)
BUN: 63 mg/dL — ABNORMAL HIGH (ref 6–20)
CO2: 30 mmol/L (ref 22–32)
CREATININE: 2.3 mg/dL — AB (ref 0.61–1.24)
Calcium: 8.8 mg/dL — ABNORMAL LOW (ref 8.9–10.3)
Chloride: 96 mmol/L — ABNORMAL LOW (ref 101–111)
GFR calc non Af Amer: 26 mL/min — ABNORMAL LOW (ref >60)
GFR, EST AFRICAN AMERICAN: 30 mL/min — AB (ref >60)
Glucose, Bld: 115 mg/dL — ABNORMAL HIGH (ref 65–99)
Potassium: 3.2 mmol/L — ABNORMAL LOW (ref 3.5–5.1)
Sodium: 139 mmol/L (ref 135–145)

## 2017-11-20 LAB — COOXEMETRY PANEL
Carboxyhemoglobin: 1.3 % (ref 0.5–1.5)
Carboxyhemoglobin: 1.6 % — ABNORMAL HIGH (ref 0.5–1.5)
METHEMOGLOBIN: 0.8 % (ref 0.0–1.5)
Methemoglobin: 0.7 % (ref 0.0–1.5)
O2 Saturation: 32.4 %
O2 Saturation: 53.4 %
Total hemoglobin: 12.2 g/dL (ref 12.0–16.0)
Total hemoglobin: 12.1 g/dL (ref 12.0–16.0)

## 2017-11-20 LAB — HEMOGLOBIN A1C
HEMOGLOBIN A1C: 6.5 % — AB (ref 4.8–5.6)
Mean Plasma Glucose: 139.85 mg/dL

## 2017-11-20 LAB — GLUCOSE, CAPILLARY
GLUCOSE-CAPILLARY: 181 mg/dL — AB (ref 65–99)
GLUCOSE-CAPILLARY: 204 mg/dL — AB (ref 65–99)
Glucose-Capillary: 119 mg/dL — ABNORMAL HIGH (ref 65–99)
Glucose-Capillary: 264 mg/dL — ABNORMAL HIGH (ref 65–99)

## 2017-11-20 LAB — MAGNESIUM: MAGNESIUM: 2.2 mg/dL (ref 1.7–2.4)

## 2017-11-20 MED ORDER — POTASSIUM CHLORIDE CRYS ER 20 MEQ PO TBCR
40.0000 meq | EXTENDED_RELEASE_TABLET | Freq: Once | ORAL | Status: AC
  Administered 2017-11-20: 40 meq via ORAL
  Filled 2017-11-20: qty 2

## 2017-11-20 NOTE — Progress Notes (Signed)
CoOx returns at 53.4.  Dr. Gala Romney paged.  New order received to increase Milrinone to 0.324mcg/kg/min.  Cardioversion to be placed on hold until tomorrow.  Patient and family notified.

## 2017-11-20 NOTE — Progress Notes (Signed)
Dr. Gala Romney and Dr. Sunnie Nielsen both at bedside to discuss plan of care w/patient and family (daughter and grandson).  Plan for today will most likely include cardioversion; procedure and necessity explained to patient and family; informed consent signed by both patient and daughter.  Patient remains on Milrinone gtt @ 0.3mcg/kg/min and Amiodarone at 30mg /hr.  Will await CoOx result to determine if patient goes to procedure today or tomorrow.

## 2017-11-20 NOTE — Progress Notes (Signed)
PROGRESS NOTE  Stanley Farmer OFB:[id number] DOB: 11/11/1940 DOA: 11/17/2017 PCP: Maris Berger, MD   LOS: 2 days   Brief Narrative / Interim history: Patient is a 77 year old Spanish-speaking male with a systolic CHF, EF 78%EUMPNTI cardiomyopathy, CKD stage III, DM type II, hyperlipidemia, and remote alcohol tobacco abuse presented with 3 days complaint of abdominal swelling, shortness of breath with exertion.  BNP 1047, hypoxic with O2 sats 82% with ambulation.  Creatinine 1.9.  Patient was admitted for further workup for CHF exacerbation.  Assessment & Plan: Principal Problem:   Cardiogenic shock ([place]) Active Problems:   Acute on chronic combined systolic and diastolic CHF (congestive heart failure) (HCC)   Essential hypertension   DM type 2 with diabetic peripheral neuropathy (HCC)   Abdominal distension   CKD (chronic kidney disease)   BPH (benign prostatic hyperplasia)   PAF (paroxysmal atrial fibrillation) (HCC)   AKI (acute kidney injury) ([place])   Acute on chronic combined systolic and diastolic heart failure ([place]) with concern for cardiogenic shock -Patient presented with abdominal distention, lower extremity swelling, BNP elevated 1047. -Last EF 20% per echo in 10/17, apparently patient had declined AICD placement -Continue IV Lasix diuresis, negative balance of 1.5 L, creatinine trended up to 2.0 -CHF team following, repeat 2D echo during this hospitalization showed EF of 10% with diffuse hypokinesis -Hold Coreg, No ACE/ARB due to renal insufficiency -Patient started on milrinone, transfer to stepdown, improved Co-ox  Atrial fibrillation- new onset -EKG on admission showed atrial fibrillation with PVCs, prolonged QTC 575 -Contributing to #1, worsening output -Patient started on amiodarone for rate control.  BP soft and with acute CHF, difficult to place on Cardizem or beta-blocker -Started on eliquis, Chads vasc 8 -Patient is to get TEE/DCCV tomorrow  Acute kidney  injury on CKD (chronic kidney disease) stage III -Baseline creatinine 1.2-1.9 over the last year. -Creatinine continues to climb, likely cardiorenal syndrome, on milrinone, presence of A. fib certainly does not help, continue amiodarone and patient to get cardioverted tomorrow  Essential hypertension -BP soft, hold Coreg, enalapril (no ACE/ARB secondary to renal insufficiency)  DM type 2 with diabetic peripheral neuropathy (HCC) -Presented with hypoglycemia glucose 66 on admission, continue Lantus at half dose, sliding scale insulin -Hemoglobin A1c 6.5 -Fasting CBG is 115 this morning continue current regimen  BPH (benign prostatic hyperplasia) -Continue alfuzosin  CAD -Continue Plavix, statin  Hyperlipidemia -Continue atorvastatin    DVT prophylaxis: Eliquis Code Status: DNR Family Communication: family at bedside Disposition Plan: home when ready   Consultants:   Cardiology - CHF  Procedures:   2D echo:  Impressions: - LVEF<10%, severely dilated ventricle, global hypokinesis, calcified aortic valve with poor leaflet excursion (mostly due to very low cardiac output), trivial AI, moderate MR with mild mitral stenosis and moderate MAC, moderate biatrial enlargement, severe pulmonary hypertension with RVSP of 76 mmHg, dilated IVC.  Antimicrobials:  None    Subjective: -Feeling a little bit better, less short of breath.  No chest pain.  Objective: Vitals:   11/19/17 2326 11/20/17 0330 11/20/17 0604 11/20/17 0733  BP: 92/65 (!) 107/53  (!) 103/58  Pulse: 87 92    Resp: (!) 22 (!) 21  (!) 22  Temp: 98.3 F (36.8 C) 98.3 F (36.8 C)  98.9 F (37.2 C)  TempSrc: Oral Oral  Oral  SpO2: 97% 98%  96%  Weight:   81.4 kg (179 lb 7.3 oz)   Height:        Intake/Output Summary (Last 24 hours)  at 11/20/2017 1015 Last data filed at 11/20/2017 3846 Gross per 24 hour  Intake 515.5 ml  Output 950 ml  Net -434.5 ml   Filed Weights   11/18/17 1630 11/19/17 0444  11/20/17 0604  Weight: 82.1 kg (180 lb 14.4 oz) 81.5 kg (179 lb 9.6 oz) 81.4 kg (179 lb 7.3 oz)    Examination:  Constitutional: NAD Eyes: lids and conjunctivae normal ENMT: Mucous membranes are moist. Respiratory: clear to auscultation bilaterally, no wheezing, no crackles. Normal respiratory effort.  Cardiovascular: Irregular, no murmurs, 1+ lower extremity edema Abdomen: no tenderness. Bowel sounds positive.  Skin: no rashes Neurologic: Nonfocal   Data Reviewed: I have independently reviewed following labs and imaging studies   CBC: Recent Labs  Lab 11/17/17 1831 11/18/17 0310  WBC 7.1 6.3  NEUTROABS  --  3.6  HGB 13.7 12.3*  HCT 40.3 37.9*  MCV 89.4 90.5  PLT 279 659   Basic Metabolic Panel: Recent Labs  Lab 11/17/17 1831 11/18/17 0310 11/19/17 0412 11/20/17 0752  NA 142  --  141 139  K 3.3*  --  3.2* 3.2*  CL 94*  --  96* 96*  CO2 34*  --  32 30  GLUCOSE 66  --  107* 115*  BUN 59*  --  62* 63*  CREATININE 1.90*  --  2.04* 2.30*  CALCIUM 9.4  --  9.1 8.8*  MG  --  2.5*  --  2.2   GFR: Estimated Creatinine Clearance: 25.9 mL/min (A) (by C-G formula based on SCr of 2.3 mg/dL (H)). Liver Function Tests: Recent Labs  Lab 11/17/17 1831  AST 21  ALT 16*  ALKPHOS 70  BILITOT 0.8  PROT 7.5  ALBUMIN 3.8   Recent Labs  Lab 11/17/17 1831  LIPASE 30   No results for input(s): AMMONIA in the last 168 hours. Coagulation Profile: No results for input(s): INR, PROTIME in the last 168 hours. Cardiac Enzymes: Recent Labs  Lab 11/18/17 0310 11/18/17 0925 11/18/17 1651  TROPONINI 0.06* 0.06* 0.06*   BNP (last 3 results) No results for input(s): PROBNP in the last 8760 hours. HbA1C: Recent Labs    11/20/17 0752  HGBA1C 6.5*   CBG: Recent Labs  Lab 11/19/17 0727 11/19/17 1115 11/19/17 1709 11/19/17 2139 11/20/17 0820  GLUCAP 171* 172* 147* 101* 119*   Lipid Profile: No results for input(s): CHOL, HDL, LDLCALC, TRIG, CHOLHDL, LDLDIRECT in  the last 72 hours. Thyroid Function Tests: No results for input(s): TSH, T4TOTAL, FREET4, T3FREE, THYROIDAB in the last 72 hours. Anemia Panel: No results for input(s): VITAMINB12, FOLATE, FERRITIN, TIBC, IRON, RETICCTPCT in the last 72 hours. Urine analysis:    Component Value Date/Time   COLORURINE STRAW (A) 11/17/2017 2330   APPEARANCEUR CLEAR 11/17/2017 2330   LABSPEC 1.008 11/17/2017 2330   PHURINE 8.0 11/17/2017 2330   GLUCOSEU NEGATIVE 11/17/2017 2330   HGBUR NEGATIVE 11/17/2017 2330   BILIRUBINUR NEGATIVE 11/17/2017 2330   KETONESUR NEGATIVE 11/17/2017 2330   PROTEINUR NEGATIVE 11/17/2017 2330   UROBILINOGEN 1.0 08/03/2015 2035   NITRITE NEGATIVE 11/17/2017 2330   LEUKOCYTESUR NEGATIVE 11/17/2017 2330   Sepsis Labs: Invalid input(s): PROCALCITONIN, LACTICIDVEN  Recent Results (from the past 240 hour(s))  MRSA PCR Screening     Status: None   Collection Time: 11/19/17  2:43 PM  Result Value Ref Range Status   MRSA by PCR NEGATIVE NEGATIVE Final    Comment:        The GeneXpert MRSA Assay (FDA approved for  NASAL specimens only), is one component of a comprehensive MRSA colonization surveillance program. It is not intended to diagnose MRSA infection nor to guide or monitor treatment for MRSA infections. Performed at [facility], [place] [address] [place],  [address]       Radiology Studies: Dg Chest Port 1 View  Result Date: 11/19/2017 CLINICAL DATA:  PICC line placement EXAM: PORTABLE CHEST 1 VIEW COMPARISON:  11/17/2017 FINDINGS: Lungs are clear.  No pleural effusion or pneumothorax. Cardiomegaly. Right arm PICC terminates in the lower SVC. IMPRESSION: Right arm PICC terminates in the lower SVC. Electronically Signed   By: Julian Hy M.D.   On: 11/19/2017 13:44     Scheduled Meds: . alfuzosin  10 mg Oral Daily  . apixaban  5 mg Oral BID  . atorvastatin  40 mg Oral QHS  . famotidine  20 mg Oral Daily  . furosemide  80 mg Intravenous BID    . gabapentin  300 mg Oral TID  . insulin aspart  0-20 Units Subcutaneous TID WC  . insulin glargine  40 Units Subcutaneous q morning - 10a  . polyethylene glycol  17 g Oral BID  . potassium chloride SA  40 mEq Oral Daily  . sodium chloride flush  3 mL Intravenous Q12H   Continuous Infusions: . sodium chloride    . amiodarone 30 mg/hr (11/19/17 2155)  . milrinone 0.375 mcg/kg/min (11/20/17 2527)    Marzetta Board, MD, PhD [facility] Pager [phone]  If 7PM-7AM, please contact night-coverage [email or web address] Password [email or web address] 11/20/2017, 10:15 AM

## 2017-11-20 NOTE — Progress Notes (Addendum)
Advanced Heart Failure Rounding Note   Subjective:    Moved to 2C yesterday due to low output HF. Co-ox 33%. Started on milrinone and co-ox up to 51% Now back down to 32%   Remains on amio for AF. Weight unchanged on IV lasix. Labs pending.   I talked to patient and his family with the help of Dr. Sunnie Nielsen. Says he feels better this am with milrinone. Breathing better. Less bloated.   CVP 11   Objective:   Weight Range:  Vital Signs:   Temp:  [97.9 F (36.6 C)-98.9 F (37.2 C)] 98.9 F (37.2 C) (02/03 0733) Pulse Rate:  [41-92] 92 (02/03 0330) Resp:  [20-25] 22 (02/03 0733) BP: (92-110)/(53-72) 103/58 (02/03 0733) SpO2:  [95 %-98 %] 96 % (02/03 0733) Weight:  [81.4 kg (179 lb 7.3 oz)] 81.4 kg (179 lb 7.3 oz) (02/03 0604) Last BM Date: ([id number])  Weight change: Filed Weights   11/18/17 1630 11/19/17 0444 11/20/17 0604  Weight: 82.1 kg (180 lb 14.4 oz) 81.5 kg (179 lb 9.6 oz) 81.4 kg (179 lb 7.3 oz)    Intake/Output:   Intake/Output Summary (Last 24 hours) at 11/20/2017 0742 Last data filed at 11/20/2017 7829 Gross per 24 hour  Intake 814.23 ml  Output 950 ml  Net -135.77 ml     Physical Exam: General:  Lying in bed weak appearing  HEENT: normal Neck: supple. JVP to jaw Carotids 2+ bilat; no bruits. No lymphadenopathy or thryomegaly appreciated. Cor: PMI laterally displaced. IRR  +s3 Lungs: clear but dull at bases Abdomen: soft, nontender, +distended. No hepatosplenomegaly. No bruits or masses. Good bowel sounds. Extremities: no cyanosis, clubbing, rash, 1+ edema cool RUE PICC Neuro: alert & orientedx3, cranial nerves grossly intact. moves all 4 extremities w/o difficulty. Affect pleasant   Telemetry: AF 90s Personally reviewed   Labs: Basic Metabolic Panel: Recent Labs  Lab 11/17/17 1831 11/18/17 0310 11/19/17 0412  NA 142  --  141  K 3.3*  --  3.2*  CL 94*  --  96*  CO2 34*  --  32  GLUCOSE 66  --  107*  BUN 59*  --  62*  CREATININE 1.90*  --   2.04*  CALCIUM 9.4  --  9.1  MG  --  2.5*  --     Liver Function Tests: Recent Labs  Lab 11/17/17 1831  AST 21  ALT 16*  ALKPHOS 70  BILITOT 0.8  PROT 7.5  ALBUMIN 3.8   Recent Labs  Lab 11/17/17 1831  LIPASE 30   No results for input(s): AMMONIA in the last 168 hours.  CBC: Recent Labs  Lab 11/17/17 1831 11/18/17 0310  WBC 7.1 6.3  NEUTROABS  --  3.6  HGB 13.7 12.3*  HCT 40.3 37.9*  MCV 89.4 90.5  PLT 279 271    Cardiac Enzymes: Recent Labs  Lab 11/18/17 0310 11/18/17 0925 11/18/17 1651  TROPONINI 0.06* 0.06* 0.06*    BNP: BNP (last 3 results) Recent Labs    11/17/17 1831  BNP 1,047.1*    ProBNP (last 3 results) No results for input(s): PROBNP in the last 8760 hours.    Other results:  Imaging: Dg Chest Port 1 View  Result Date: 11/19/2017 CLINICAL DATA:  PICC line placement EXAM: PORTABLE CHEST 1 VIEW COMPARISON:  11/17/2017 FINDINGS: Lungs are clear.  No pleural effusion or pneumothorax. Cardiomegaly. Right arm PICC terminates in the lower SVC. IMPRESSION: Right arm PICC terminates in the lower SVC. Electronically  Signed   By: Charline Bills M.D.   On: 11/19/2017 13:44     Medications:     Scheduled Medications: . alfuzosin  10 mg Oral Daily  . apixaban  5 mg Oral BID  . atorvastatin  40 mg Oral QHS  . famotidine  20 mg Oral Daily  . furosemide  80 mg Intravenous BID  . gabapentin  300 mg Oral TID  . insulin aspart  0-20 Units Subcutaneous TID WC  . insulin glargine  40 Units Subcutaneous q morning - 10a  . polyethylene glycol  17 g Oral BID  . potassium chloride SA  40 mEq Oral Daily  . sodium chloride flush  3 mL Intravenous Q12H    Infusions: . sodium chloride    . amiodarone 30 mg/hr (11/19/17 2155)  . milrinone 0.25 mcg/kg/min (11/20/17 0657)    PRN Medications: sodium chloride, acetaminophen, bisacodyl, ondansetron (ZOFRAN) IV, sodium chloride flush, sodium chloride flush   Assessment:    Stanley Pinedais a  77 y.o.Spanish speakingmalewith medical history significantfor combined HF, NICM, EF 10-15%, with moderately reduced RV function on 01/16/16. PMH also includes CKD stage III, DM type II, hyperlipidemia, mild cognitive impairment, and remote alcohol tobacco abuse.      Plan/Discussion:     1. Acute on Chronic systolic HF -> cardiogenic shock,  - Unclear etiology. Long standing CMP, no h/o of cath, but Nuclear stress 2011 with fixed defect and no ischemia - Echo 07/2016 20%. Echo 11/18/17 shows EF 10%. With severe RV dysfunction  - PICC placed. Co-ox 2/2 33%. Started on milrinone overnight. Co-ox up to 51% now back down to 32% - Suspect AF may have triggered low output. But nearing end-stage - May need to consider emergent DC-CV. Await labs to see if developing worsening AKI - Hold coreg with decompensation. No ACE/ARB with AKI - Will need to discuss R/L Cath with him if renal function imprpvoes.  - Continue amio. - Has wide LBBB may benefit from CRT if not too late - RV dysfunction and social situation likely precludes VAD - Will recheck co-ox   2. Atrial fibrillation - Unclear chronicity. Likely related to his decompensation.  - Most recent EKGs in chart in April/May 2017 show NSR.  - CHADSVaSc is 8. Started Eliquis 2/1 - Will use amiodarone gtt for rate control.   - Ideally would do TEE/DCCV (vs AVN ablation and CRT) but may need to consider emergent DC-CV. Will await co-ox and renal function. If getting worse will plan DC-CV later today. I have apprised him and his family of the risks including risk of stroke and they are willing to proceed.   3. Elevated troponin - 0.06. Flat trend. No ACS. Likely due to HF - Denies chest pain.  4. Acute on CKD III - Cr 2.0 yesterday (baseline 1.40-1.5). Await morning labs  - Continue daily BMET.  5. H/o CVA - With cognitive deficits.   - Continue statin. Eliquis started.   6. DNR  Length of Stay: 2  [name] MD 11/20/2017,  7:42 AM  Advanced Heart Failure Team Pager [phone] (M-F; 7a - 4p)  Please contact [facility] for night-coverage after hours (4p -7a ) and weekends on [email or web address]  Addendum: Repeat co-ox improved but still marginal. Will cancel emergent DC-CV. Increase milrinone to 0.375. Continue amio. Will d/w EP about DC-CV versus AVN ablation with CRT placement which may be more durable long-term option. That said, probably needs at least one shot at [facility].  [name],  MD  1:58 PM

## 2017-11-21 ENCOUNTER — Encounter (HOSPITAL_COMMUNITY): Payer: Self-pay | Admitting: Internal Medicine

## 2017-11-21 DIAGNOSIS — I5043 Acute on chronic combined systolic (congestive) and diastolic (congestive) heart failure: Secondary | ICD-10-CM

## 2017-11-21 DIAGNOSIS — I481 Persistent atrial fibrillation: Secondary | ICD-10-CM

## 2017-11-21 DIAGNOSIS — I454 Nonspecific intraventricular block: Secondary | ICD-10-CM

## 2017-11-21 HISTORY — DX: Nonspecific intraventricular block: I45.4

## 2017-11-21 LAB — BASIC METABOLIC PANEL
ANION GAP: 13 (ref 5–15)
BUN: 59 mg/dL — AB (ref 6–20)
CO2: 27 mmol/L (ref 22–32)
Calcium: 8.4 mg/dL — ABNORMAL LOW (ref 8.9–10.3)
Chloride: 98 mmol/L — ABNORMAL LOW (ref 101–111)
Creatinine, Ser: 2.18 mg/dL — ABNORMAL HIGH (ref 0.61–1.24)
GFR calc Af Amer: 32 mL/min — ABNORMAL LOW (ref >60)
GFR, EST NON AFRICAN AMERICAN: 27 mL/min — AB (ref >60)
GLUCOSE: 201 mg/dL — AB (ref 65–99)
Potassium: 3.5 mmol/L (ref 3.5–5.1)
Sodium: 138 mmol/L (ref 135–145)

## 2017-11-21 LAB — GLUCOSE, CAPILLARY
GLUCOSE-CAPILLARY: 165 mg/dL — AB (ref 65–99)
Glucose-Capillary: 160 mg/dL — ABNORMAL HIGH (ref 65–99)
Glucose-Capillary: 280 mg/dL — ABNORMAL HIGH (ref 65–99)
Glucose-Capillary: 292 mg/dL — ABNORMAL HIGH (ref 65–99)
Glucose-Capillary: 315 mg/dL — ABNORMAL HIGH (ref 65–99)

## 2017-11-21 LAB — COOXEMETRY PANEL
CARBOXYHEMOGLOBIN: 1.8 % — AB (ref 0.5–1.5)
METHEMOGLOBIN: 0.9 % (ref 0.0–1.5)
O2 Saturation: 62.5 %
Total hemoglobin: 11.6 g/dL — ABNORMAL LOW (ref 12.0–16.0)

## 2017-11-21 MED ORDER — FUROSEMIDE 10 MG/ML IJ SOLN
80.0000 mg | Freq: Three times a day (TID) | INTRAMUSCULAR | Status: DC
  Administered 2017-11-21 – 2017-11-23 (×6): 80 mg via INTRAVENOUS
  Filled 2017-11-21 (×6): qty 8

## 2017-11-21 NOTE — Progress Notes (Signed)
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  BNP 1047, hypoxic with O2 sats 82% with ambulation.  Creatinine 1.9.  Patient was admitted for further workup for CHF exacerbation.  Assessment & Plan: Principal Problem:   Cardiogenic shock ([place]) Active Problems:   Acute on chronic combined systolic and diastolic CHF (congestive heart failure) (HCC)   Essential hypertension   DM type 2 with diabetic peripheral neuropathy (HCC)   Abdominal distension   CKD (chronic kidney disease)   BPH (benign prostatic hyperplasia)   PAF (paroxysmal atrial fibrillation) (HCC)   AKI (acute kidney injury) ([place])   Acute on chronic combined systolic and diastolic heart failure ([place]) and  Cardiogenic Shock -Patient presented with abdominal distention, lower extremity swelling, BNP elevated 1047. -Last EF 20% per echo in 10/17, apparently patient had declined AICD placement -Continue IV Lasix diuresis, negative balance of 2.9. L, cr at 2.1.  -CHF team following, repeat 2D echo during this hospitalization showed EF of 10% with diffuse hypokinesis -Hold Coreg, No ACE/ARB due to renal insufficiency -Patient started on milrinone, improved Co-ox -Plan for cardiovertion vs AVN ablation with CRT placement, await cardiology recommendations.   Atrial fibrillation- new onset -New onset A fib.  -Contributing to #1, worsening output -Patient started on amiodarone for rate control.  BP soft and with acute CHF.  -Started on eliquis, Chads vasc 8 -Patient is to get TEE/DCCV Vs AVN ablation with CRT placement.   Acute kidney injury on CKD (chronic kidney  disease) stage III -Baseline creatinine 1.2-1.9 over the last year. -secondary to cardiorenal syndrome.  -Good urine out put on milrinone and IV lasix.  -cr 2.3---2.1 stable over last 2 days.   Essential hypertension -BP soft, hold Coreg, enalapril (no ACE/ARB secondary to renal insufficiency)  DM type 2 with diabetic peripheral neuropathy (HCC) -Presented with hypoglycemia glucose 66 on admission, continue Lantus at half dose, sliding scale insulin -Hemoglobin A1c 6.5 -Fasting CBG is 115 this morning continue current regimen  BPH (benign prostatic hyperplasia) -Continue alfuzosin  CAD -Continue Plavix, statin  Hyperlipidemia -Continue atorvastatin    DVT prophylaxis: Eliquis Code Status: DNR Family Communication: family at bedside Disposition Plan: home when ready   Consultants:   Cardiology - CHF  Procedures:   2D echo:  Impressions: - LVEF<10%, severely dilated ventricle, global hypokinesis, calcified aortic valve with poor leaflet excursion (mostly due to very low cardiac output), trivial AI, moderate MR with mild mitral stenosis and moderate MAC, moderate biatrial enlargement, severe pulmonary hypertension with RVSP of 76 mmHg, dilated IVC.  Antimicrobials:  None    Subjective: He is feeling better, dyspnea has improved. Less abdominal distension.  I explain to him regarding possible cardiovertion vs AVN ablation today   Objective: Vitals:   11/21/17 0354 11/21/17 0416 11/21/17 0421 11/21/17 0710  BP:   113/62 107/72  Pulse:   88 89  Resp:   (!) 25 20  Temp:   98.8 F (37.1 C) 98.5 F (36.9 C)  TempSrc:   Oral Oral  SpO2:   95% 97%  Weight: 83.4 kg (183 lb 13.8 oz) 82.9 kg (182 lb 12.2 oz)    Height:  Intake/Output Summary (Last 24 hours) at 11/21/2017 0742 Last data filed at 11/21/2017 0600 Gross per 24 hour  Intake 610.09 ml  Output 1525 ml  Net -914.91 ml   Filed Weights   11/20/17 0604 11/21/17 0354 11/21/17 0416  Weight: 81.4 kg  (179 lb 7.3 oz) 83.4 kg (183 lb 13.8 oz) 82.9 kg (182 lb 12.2 oz)    Examination:  Constitutional: NAD Respiratory: Crackles bases, normal respiratory effort.  Cardiovascular: S 1, S 2 RRR Abdomen: BS present, soft, nt Skin: no rash  Neurologic: non focal.    Data Reviewed: I have independently reviewed following labs and imaging studies   CBC: Recent Labs  Lab 11/17/17 1831 11/18/17 0310  WBC 7.1 6.3  NEUTROABS  --  3.6  HGB 13.7 12.3*  HCT 40.3 37.9*  MCV 89.4 90.5  PLT 279 751   Basic Metabolic Panel: Recent Labs  Lab 11/17/17 1831 11/18/17 0310 11/19/17 0412 11/20/17 0752 11/21/17 0615  NA 142  --  141 139 138  K 3.3*  --  3.2* 3.2* 3.5  CL 94*  --  96* 96* 98*  CO2 34*  --  32 30 27  GLUCOSE 66  --  107* 115* 201*  BUN 59*  --  62* 63* 59*  CREATININE 1.90*  --  2.04* 2.30* 2.18*  CALCIUM 9.4  --  9.1 8.8* 8.4*  MG  --  2.5*  --  2.2  --    GFR: Estimated Creatinine Clearance: 27.6 mL/min (A) (by C-G formula based on SCr of 2.18 mg/dL (H)). Liver Function Tests: Recent Labs  Lab 11/17/17 1831  AST 21  ALT 16*  ALKPHOS 70  BILITOT 0.8  PROT 7.5  ALBUMIN 3.8   Recent Labs  Lab 11/17/17 1831  LIPASE 30   No results for input(s): AMMONIA in the last 168 hours. Coagulation Profile: No results for input(s): INR, PROTIME in the last 168 hours. Cardiac Enzymes: Recent Labs  Lab 11/18/17 0310 11/18/17 0925 11/18/17 1651  TROPONINI 0.06* 0.06* 0.06*   BNP (last 3 results) No results for input(s): PROBNP in the last 8760 hours. HbA1C: Recent Labs    11/20/17 0752  HGBA1C 6.5*   CBG: Recent Labs  Lab 11/19/17 2139 11/20/17 0820 11/20/17 1221 11/20/17 1655 11/20/17 2106  GLUCAP 101* 119* 181* 204* 264*   Lipid Profile: No results for input(s): CHOL, HDL, LDLCALC, TRIG, CHOLHDL, LDLDIRECT in the last 72 hours. Thyroid Function Tests: No results for input(s): TSH, T4TOTAL, FREET4, T3FREE, THYROIDAB in the last 72 hours. Anemia  Panel: No results for input(s): VITAMINB12, FOLATE, FERRITIN, TIBC, IRON, RETICCTPCT in the last 72 hours. Urine analysis:    Component Value Date/Time   COLORURINE STRAW (A) 11/17/2017 2330   APPEARANCEUR CLEAR 11/17/2017 2330   LABSPEC 1.008 11/17/2017 2330   PHURINE 8.0 11/17/2017 2330   GLUCOSEU NEGATIVE 11/17/2017 2330   HGBUR NEGATIVE 11/17/2017 2330   BILIRUBINUR NEGATIVE 11/17/2017 2330   KETONESUR NEGATIVE 11/17/2017 2330   PROTEINUR NEGATIVE 11/17/2017 2330   UROBILINOGEN 1.0 08/03/2015 2035   NITRITE NEGATIVE 11/17/2017 2330   LEUKOCYTESUR NEGATIVE 11/17/2017 2330   Sepsis Labs: Invalid input(s): PROCALCITONIN, LACTICIDVEN  Recent Results (from the past 240 hour(s))  MRSA PCR Screening     Status: None   Collection Time: 11/19/17  2:43 PM  Result Value Ref Range Status   MRSA by PCR NEGATIVE NEGATIVE Final    Comment:        The GeneXpert MRSA Assay (FDA approved  for NASAL specimens only), is one component of a comprehensive MRSA colonization surveillance program. It is not intended to diagnose MRSA infection nor to guide or monitor treatment for MRSA infections. Performed at [facility], [place] [address], [place], [place] [address]       Radiology Studies: Dg Chest Port 1 View  Result Date: 11/19/2017 CLINICAL DATA:  PICC line placement EXAM: PORTABLE CHEST 1 VIEW COMPARISON:  11/17/2017 FINDINGS: Lungs are clear.  No pleural effusion or pneumothorax. Cardiomegaly. Right arm PICC terminates in the lower SVC. IMPRESSION: Right arm PICC terminates in the lower SVC. Electronically Signed   By: Julian Hy M.D.   On: 11/19/2017 13:44     Scheduled Meds: . alfuzosin  10 mg Oral Daily  . apixaban  5 mg Oral BID  . atorvastatin  40 mg Oral QHS  . famotidine  20 mg Oral Daily  . furosemide  80 mg Intravenous BID  . gabapentin  300 mg Oral TID  . insulin aspart  0-20 Units Subcutaneous TID WC  . insulin glargine  40 Units Subcutaneous q morning -  10a  . polyethylene glycol  17 g Oral BID  . potassium chloride SA  40 mEq Oral Daily  . sodium chloride flush  3 mL Intravenous Q12H   Continuous Infusions: . sodium chloride    . amiodarone 30 mg/hr (11/20/17 1203)  . milrinone 0.375 mcg/kg/min (11/20/17 1849)   Niel Hummer, MD [facility] Pager [phone]  If 7PM-7AM, please contact night-coverage [email or web address] Password [facility] 11/21/2017, 7:42 AM

## 2017-11-21 NOTE — Discharge Instructions (Signed)

## 2017-11-21 NOTE — Progress Notes (Signed)
Advanced Heart Failure Rounding Note   Subjective:   Spanish speaking. Stratus used for interpretation. Denies SOB. 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 Wife says they never learned to read.   On milrinone 0.375 mcg. CO-OX is 62.5 %. PICC line placed.    Objective:   Weight Range:  Vital Signs:   Temp:  [98.3 F (36.8 C)-99.5 F (37.5 C)] 98.5 F (36.9 C) (02/04 0710) Pulse Rate:  [86-96] 89 (02/04 0710) Resp:  [17-25] 20 (02/04 0710) BP: (92-113)/(61-81) 107/72 (02/04 0710) SpO2:  [91 %-98 %] 97 % (02/04 0710) Weight:  [182 lb 12.2 oz (82.9 kg)-183 lb 13.8 oz (83.4 kg)] 182 lb 12.2 oz (82.9 kg) (02/04 0416) Last BM Date: ([id number])  Weight change: Filed Weights   11/20/17 0604 11/21/17 0354 11/21/17 0416  Weight: 179 lb 7.3 oz (81.4 kg) 183 lb 13.8 oz (83.4 kg) 182 lb 12.2 oz (82.9 kg)    Intake/Output:   Intake/Output Summary (Last 24 hours) at 11/21/2017 1000 Last data filed at 11/21/2017 0600 Gross per 24 hour  Intake 610.09 ml  Output 1525 ml  Net -914.91 ml     Physical Exam: CVP 14.  General:  Appears weak. No resp difficulty HEENT: normal Neck: supple. JVP to jaw. Carotids 2+ bilat; no bruits. No lymphadenopathy or thryomegaly appreciated. Cor: PMI nondisplaced. Irregular rate & rhythm. No rubs, or murmurs. + S3  Lungs: Coarse throughout Abdomen: soft, nontender, nondistended. No hepatosplenomegaly. No bruits or masses. Good bowel sounds. Extremities: no cyanosis, clubbing, rash, edema. RUE PICC Neuro: alert & orientedx3, cranial nerves grossly intact. moves all 4 extremities w/o difficulty. Affect pleasant  Telemetry:  Afib 90s personally reviewed.    Labs: Basic Metabolic Panel: Recent Labs  Lab 11/17/17 1831 11/18/17 0310 11/19/17 0412 11/20/17 0752 11/21/17 0615  NA 142  --  141 139 138  K 3.3*  --  3.2* 3.2* 3.5  CL 94*  --  96* 96* 98*  CO2 34*  --  32 30 27  GLUCOSE 66  --  107* 115* 201*  BUN 59*  --  62* 63* 59*  CREATININE 1.90*  --   2.04* 2.30* 2.18*  CALCIUM 9.4  --  9.1 8.8* 8.4*  MG  --  2.5*  --  2.2  --     Liver Function Tests: Recent Labs  Lab 11/17/17 1831  AST 21  ALT 16*  ALKPHOS 70  BILITOT 0.8  PROT 7.5  ALBUMIN 3.8   Recent Labs  Lab 11/17/17 1831  LIPASE 30   No results for input(s): AMMONIA in the last 168 hours.  CBC: Recent Labs  Lab 11/17/17 1831 11/18/17 0310  WBC 7.1 6.3  NEUTROABS  --  3.6  HGB 13.7 12.3*  HCT 40.3 37.9*  MCV 89.4 90.5  PLT 279 271    Cardiac Enzymes: Recent Labs  Lab 11/18/17 0310 11/18/17 0925 11/18/17 1651  TROPONINI 0.06* 0.06* 0.06*    BNP: BNP (last 3 results) Recent Labs    11/17/17 1831  BNP 1,047.1*    ProBNP (last 3 results) No results for input(s): PROBNP in the last 8760 hours.    Other results:  Imaging: Dg Chest Port 1 View  Result Date: 11/19/2017 CLINICAL DATA:  PICC line placement EXAM: PORTABLE CHEST 1 VIEW COMPARISON:  11/17/2017 FINDINGS: Lungs are clear.  No pleural effusion or pneumothorax. Cardiomegaly. Right arm PICC terminates in the lower SVC. IMPRESSION: Right arm PICC terminates in the lower SVC.  Electronically Signed   By: Charline Bills M.D.   On: 11/19/2017 13:44     Medications:     Scheduled Medications: . alfuzosin  10 mg Oral Daily  . apixaban  5 mg Oral BID  . atorvastatin  40 mg Oral QHS  . famotidine  20 mg Oral Daily  . furosemide  80 mg Intravenous BID  . gabapentin  300 mg Oral TID  . insulin aspart  0-20 Units Subcutaneous TID WC  . insulin glargine  40 Units Subcutaneous q morning - 10a  . polyethylene glycol  17 g Oral BID  . potassium chloride SA  40 mEq Oral Daily  . sodium chloride flush  3 mL Intravenous Q12H    Infusions: . sodium chloride    . amiodarone 30 mg/hr (11/20/17 1203)  . 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 PMH also includes CKD stage III, DM type II, hyperlipidemia, mild cognitive impairment, and remote alcohol tobacco abuse.      Plan/Discussion:     1. Acute on Chronic systolic HF -> cardiogenic shock,  - Unclear etiology. Long standing CMP, no h/o of cath, but Nuclear stress 2011 with fixed defect and no ischemia - Echo 07/2016 20%. Echo 11/18/17 shows EF 10%. With severe RV dysfunction  - PICC placed. Co-ox 2/2 33%. Started on milrinone overnight. Co-ox up to 51% now back down to 32% Todays CO-OX has improved --> 62.5%. On milrinone 0.375 mcg. Doubt he would be home milrinone candidate.  -CVP 14-15. Increase lasix 80 mg three times a day.  - Suspect AF may have triggered low output. But nearing end-stage -- Hold coreg with decompensation. No ACE/ARB with AKI - Will need to discuss R/L Cath with him if renal function improver..  - Continue amio. - Has wide LBBB may benefit from CRT if not too late. EP Consulted.  - RV dysfunction and social situation likely precludes VAD  2. Atrial fibrillation - Unclear chronicity. Likely related to his decompensation.  - Most recent EKGs in chart in April/May 2017 show NSR.  - CHADSVaSc is 8. Started Eliquis 2/1 - Continue amiodarone gtt for rate control.   - Ideally would do TEE/DCCV (vs AVN ablation and CRT) but may need to consider emergent DC-CV.  - Consult EP.   3. Elevated troponin - 0.06. Flat trend. No ACS. Likely due to HF - Denies chest pain.  4. Acute on CKD III - Creatinine ok 2.3>2.2  - BMEt in am.    5. H/o CVA - With cognitive deficits.   - Continue statin. Eliquis started.   6. DNR  7. Illiterate.  Unable to read spanish or english.    Length of Stay: 3  Amy Clegg NP-C  11/21/2017, 10:00 AM  Advanced Heart Failure  Team Pager [phone] (M-F; 7a - 4p)  Please contact [facility] for night-coverage after hours (4p -7a ) and weekends on [email or web address]  Patient seen and examined with [name], NP. We discussed all aspects of the encounter. I agree with the assessment and plan as stated above.   Continues to struggle with severe heart failure. Co-ox improved today on milrinone 0.375. CVP remains elevated. Continue IV diuresis.Renal fucntion elevated but stable.    Long talk with  interpreter present and multiple barriers to advanced therapies.   Seen by Dr. Graciela Husbands in EP and felt not to be true LBBB and uncertain benefit of CRT.   I think only plan at this point is to proceed with TEE/DC-CV and see if he improves with restoration of NSR. Continue IV amio.   [name], MD  5:18 PM

## 2017-11-21 NOTE — Progress Notes (Signed)
PT Cancellation Note  Patient Details Name: Maxwel Kuka MRN: [id number] DOB: 1941/03/15   Cancelled Treatment:    Reason Eval/Treat Not Completed: Medical issues which prohibited therapy. Pt transferred to stepdown unit after initial PT eval. Currently awaiting medical stability for pt to undergo TEE/DCCV. Spoke with RN who recommends holding PT Rx today to await cardiac decision. PT to re-attempt on later date.    Ilda Foil 11/21/2017, 12:48 PM

## 2017-11-21 NOTE — Consult Note (Signed)
Cardiology Consultation:   Patient ID: Stanley Farmer; [id number]; Aug 28, 1941   Admit date: 11/17/2017 Date of Consult: 11/21/2017  Primary Care Provider: Maris Berger, MD Primary Cardiologist: [facility], Talbert Cage, NP                                       Dr. Geraldo Pitter Primary Electrophysiologist:  None, new to Dr. Klein/[facility]   Patient Profile:   Stanley Farmer is a 77 y.o. 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 Haroldine Laws.  In review of AHF note, prior cardiac testing includes Nuclear Stress 2011 (Per [facility] Cardiology Note) with fixed filling defect and no ischemia.  Echo (01/16/16): LVEF: 10-15%, Diffuse HK, Grade II DD, Mod AR, mild LAE, RV function moderately reduced, moderate LAE, moderate TR, PA peak pressure 33m Hg, mild posterior pericardial effusion  Echo 07/2016 ([place]) EF 20 DG Abd Acute w/ Chest (11/17/17): Cardiac enlargement. No evidence of active pulmonary disease.    History of Present Illness:   [name], [facility] dx with acute/chronic HF, he was also found in AFib, chronicity unknown, not on a/c out patient and started on Eliquis on 11/18/17 w/amiodarone for rate control given acute CHF decompensation not a BB/CCB candidate with plans for TEE/DCCV early this week, and possible R/LHC pending renal status.   Low output status required addition of milrinone started Saturday (biventricular failure).  EP is asked to weigh in on AFib options, consideration of possible AV node ablation w/CRT vs start w/DCCV/AAD.  Co-ox this AM up to 62.5, on milrinone, not felt likely to be a good home milrinone candidate or advanced therapies given social limitations.  He is  illiterate, unable to read/write either [place] very hard of hearing, poor understanding by the patient/wife of his clinic issues.  Out patient notes suggest good support from his Son.  Patient is DNR status  LABS K+ 3.5 BUN/Creat 59/2.18 (baseline thought to be about 1.4) Mag 2.2 Trop 0.06 (x3) WBC 6.3 H/H 12/37 plts 271  Out pt meds include Coreg and enalapril, plavix, demadex, K+   Stratus language interp. NMathews [id number] used for our visit. And on my visit [place] Denies sob today and is feellijng much better over the last few days, hungry  Past Medical History:  Diagnosis Date  . AKI (acute kidney injury) ([facility] 11/20/2017  . CHF (congestive heart failure) ([place]   . CKD (chronic kidney disease)   . 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 Diabetes mellitus without complication ([place]   . Hypertension   . 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 PAF (paroxysmal atrial fibrillation) ([facility] 11/20/2017    Past Surgical History:  Procedure Laterality Date  . KIDNEY STONE SURGERY         Inpatient Medications: Scheduled Meds: . alfuzosin  10 mg Oral Daily  . apixaban  5 mg Oral BID  . atorvastatin  40 mg Oral QHS  . famotidine  20 mg Oral Daily  . furosemide  80 mg Intravenous TID  . gabapentin  300 mg Oral TID  . insulin aspart  0-20 Units  Subcutaneous TID WC  . insulin glargine  40 Units Subcutaneous q morning - 10a  . polyethylene glycol  17 g Oral BID  . potassium chloride SA  40 mEq Oral Daily  . sodium chloride flush  3 mL Intravenous Q12H   Continuous Infusions: . sodium chloride    . amiodarone 30 mg/hr (11/21/17 1451)  . milrinone 0.375 mcg/kg/min (11/21/17 1736)   PRN Meds: sodium chloride, acetaminophen, bisacodyl, ondansetron (ZOFRAN) IV, sodium chloride flush, sodium chloride flush  Allergies:   No Known Allergies  Social History:   Social History   Socioeconomic History  . Marital status: Married     Spouse name: Not on file  . Number of children: Not on file  . Years of education: Not on file  . Highest education level: Not on file  Social Needs  . Financial resource strain: Not on file  . Food insecurity - worry: Not on file  . Food insecurity - inability: Not on file  . Transportation needs - medical: Not on file  . Transportation needs - non-medical: Not on file  Occupational History  . Not on file  Tobacco Use  . Smoking status: Former Research scientist (life sciences)  . Smokeless tobacco: Never Used  Substance and Sexual Activity  . Alcohol use: No  . Drug use: No  . Sexual activity: Not Currently  Other Topics Concern  . Not on file  Social History Narrative  . Not on file    Family History:   Family History  Problem Relation Age of Onset  . Diabetes Mother      ROS:  Please see the history of present illness.  All other ROS reviewed and negative.     Physical Exam/Data:   Vitals:   11/21/17 0421 11/21/17 0710 11/21/17 1141 11/21/17 1522  BP: 113/62 107/72 117/87 108/62  Pulse: 88 89 96 96  Resp: (!) 25 20 (!) 21 16  Temp: 98.8 F (37.1 C) 98.5 F (36.9 C) 98.4 F (36.9 C) 98.7 F (37.1 C)  TempSrc: Oral Oral Oral Oral  SpO2: 95% 97% 95% 96%  Weight:      Height:        Intake/Output Summary (Last 24 hours) at 11/21/2017 1831 Last data filed at 11/21/2017 1639 Gross per 24 hour  Intake 765.49 ml  Output 2026 ml  Net -1260.51 ml   Filed Weights   11/20/17 0604 11/21/17 0354 11/21/17 0416  Weight: 179 lb 7.3 oz (81.4 kg) 183 lb 13.8 oz (83.4 kg) 182 lb 12.2 oz (82.9 kg)   Body mass index is 31.37 kg/m.  General:  Well nourished, well developed, in no acute distress HEENT: normal Lymph: no adenopathy Neck: + JVD Endocrine:  No thryomegaly Vascular: No carotid bruits Cardiac:  RRR; 1/6 SM, no rubs Lungs:  Diminished at bases, no crackles, no wheezing, rhonchi or rales  Abd: soft, nontender Ext: no edema Musculoskeletal:  No deformities, age appropriate  atrophy Skin: warm and dry  Neuro:   no focal abnormalities noted Psych:  Normal affect   EKG:  The EKG was personally reviewed and demonstrates:   Looks Afib, 94bpm, LBBB, QRS 149m, QTc 5761m 01/19/16: is SR 65bpm, LBBB, QRS is 18036mQTc 543m49mlemetry:  Telemetry was personally reviewed and demonstrates:   Afib 80's  Relevant CV Studies:  11/18/17: TTE Study Conclusions - Left ventricle: The cavity size was severely dilated. Wall   thickness was increased in a pattern of mild LVH. The estimated   ejection fraction  was 10%. Diffuse hypokinesis. The study is not   technically sufficient to allow evaluation of LV diastolic   function. - Aortic valve: Moderately calcified leaflets. Poor leaflet   excursion due to low cardiac output. There was trivial   regurgitation. - Mitral valve: Calcified annulus. Mildly thickened leaflets . Mild   stenosis. There was moderate regurgitation. - Left atrium: Moderately dilated. - Right ventricle: The cavity size was moderately dilated. Reduced   systolic function. - Right atrium: Moderately dilated. - Tricuspid valve: There was moderate regurgitation. - Pulmonary arteries: Dilated. PA peak pressure: 76 mm Hg (S). - Inferior vena cava: The vessel was dilated. The respirophasic   diameter changes were blunted (< 50%), consistent with elevated   central venous pressure. Impressions: - LVEF<10%, severely dilated ventricle, global hypokinesis,   calcified aortic valve with poor leaflet excursion (mostly due to   very low cardiac output), trivial AI, moderate MR with mild   mitral stenosis and moderate MAC, moderate biatrial enlargement,   severe pulmonary hypertension with RVSP of 76 mmHg, dilated IVC.   Laboratory Data:  Chemistry Recent Labs  Lab 11/19/17 0412 11/20/17 0752 11/21/17 0615  NA 141 139 138  K 3.2* 3.2* 3.5  CL 96* 96* 98*  CO2 32 30 27  GLUCOSE 107* 115* 201*  BUN 62* 63* 59*  CREATININE 2.04* 2.30* 2.18*  CALCIUM  9.1 8.8* 8.4*  GFRNONAA 30* 26* 27*  GFRAA 34* 30* 32*  ANIONGAP _0 Recent Labs  Lab 11/17/17 1831  PROT 7.5  ALBUMIN 3.8  AST 21  ALT 16*  ALKPHOS 70  BILITOT 0.8   Hematology Recent Labs  Lab 11/17/17 1831 11/18/17 0310  WBC 7.1 6.3  RBC 4.51 4.19*  HGB 13.7 12.3*  HCT 40.3 37.9*  MCV 89.4 90.5  MCH 30.4 29.4  MCHC 34.0 32.5  RDW 14.1 14.5  PLT 279 271   Cardiac Enzymes Recent Labs  Lab 11/18/17 0310 11/18/17 0925 11/18/17 1651  TROPONINI 0.06* 0.06* 0.06*   No results for input(s): TROPIPOC in the last 168 hours.  BNP Recent Labs  Lab 11/17/17 1831  BNP 1,047.1*    DDimer No results for input(s): DDIMER in the last 168 hours.  Radiology/Studies:  Dg Chest Port 1 View Result Date: 11/19/2017 CLINICAL DATA:  PICC line placement EXAM: PORTABLE CHEST 1 VIEW COMPARISON:  11/17/2017 FINDINGS: Lungs are clear.  No pleural effusion or pneumothorax. Cardiomegaly. Right arm PICC terminates in the lower SVC. IMPRESSION: Right arm PICC terminates in the lower SVC. Electronically Signed   By: Julian Hy M.D.   On: 11/19/2017 13:44    Assessment and Plan:    1. Persistent AFib     CHA2DS2Vasc is 9, started on Eliquis, started 2/1/9 PM dose     may need to consider alternative/dose adjustment pending final renal status, follow closely     On amiodarone gtt, no BB/CCB with acute CHF decompensation     Rate is pretty well controlled, generally 80's-90's  I have reviewed the case with Dr. Caryl Comes, not true LBBB, and given severity of LV dysfunction, not certain CRT will add much to his overall prognosis, he will discuss further with Dr. Haroldine Laws.   2. Acute/chronic CHF     Severely reduced LVEF <00%, RV systolic function also reduced     AHF team on board, not likely advanced therapies candidate     Cumulatively fluid neg -3417m     On milrinone  3. Acute/chronic  renal insufficiency     Daily labs    For questions or updates, please contact  [employer] Please consult [email or web address] for contact info under Cardiology/STEMI.   Signed, Virl Axe, MD  11/21/2017 6:31 PM;l  NICM-- biventricular    CHF class 4 on iontropes   IVCD  AFib persistent  CVA with residual defects    Pts IVCD while wide does not invite salvage CRT. This has been discussed for LBBB in whom the likelihood of response is significantly greater.   The lack of notching further makes electrical dyssynchrony less likely still  I agree with Dr Reine Just to pursue SR  If his functional status improves, consideration could be given to CRT for what benefit there might be, but for salvage, I think risk benefit does not favor intervention   Have reviewed this w pt and his wife and daughter  Who expressed understanding   Call for further questions

## 2017-11-22 ENCOUNTER — Inpatient Hospital Stay (HOSPITAL_COMMUNITY): Payer: Medicare Other

## 2017-11-22 ENCOUNTER — Inpatient Hospital Stay (HOSPITAL_COMMUNITY): Payer: Medicare Other | Admitting: Certified Registered"

## 2017-11-22 ENCOUNTER — Encounter (HOSPITAL_COMMUNITY): Payer: Self-pay | Admitting: Certified Registered"

## 2017-11-22 ENCOUNTER — Encounter (HOSPITAL_COMMUNITY)
Admission: EM | Disposition: A | Discharge status: Home Health | Payer: Self-pay | Source: Home / Self Care | Attending: Family Medicine

## 2017-11-22 DIAGNOSIS — I340 Nonrheumatic mitral (valve) insufficiency: Secondary | ICD-10-CM

## 2017-11-22 HISTORY — PX: TEE WITHOUT CARDIOVERSION: SHX5443

## 2017-11-22 HISTORY — PX: CARDIOVERSION: SHX1299

## 2017-11-22 LAB — GLUCOSE, CAPILLARY
GLUCOSE-CAPILLARY: 217 mg/dL — AB (ref 65–99)
GLUCOSE-CAPILLARY: 267 mg/dL — AB (ref 65–99)
GLUCOSE-CAPILLARY: 201 mg/dL — AB (ref 65–99)
Glucose-Capillary: 214 mg/dL — ABNORMAL HIGH (ref 65–99)

## 2017-11-22 LAB — BASIC METABOLIC PANEL
ANION GAP: 13 (ref 5–15)
BUN: 50 mg/dL — ABNORMAL HIGH (ref 6–20)
CALCIUM: 8.4 mg/dL — AB (ref 8.9–10.3)
CO2: 27 mmol/L (ref 22–32)
Chloride: 95 mmol/L — ABNORMAL LOW (ref 101–111)
Creatinine, Ser: 2.05 mg/dL — ABNORMAL HIGH (ref 0.61–1.24)
GFR calc non Af Amer: 30 mL/min — ABNORMAL LOW (ref >60)
GFR, EST AFRICAN AMERICAN: 34 mL/min — AB (ref >60)
Glucose, Bld: 287 mg/dL — ABNORMAL HIGH (ref 65–99)
Potassium: 3.5 mmol/L (ref 3.5–5.1)
Sodium: 135 mmol/L (ref 135–145)

## 2017-11-22 LAB — COOXEMETRY PANEL
CARBOXYHEMOGLOBIN: 1.5 % (ref 0.5–1.5)
Methemoglobin: 0.9 % (ref 0.0–1.5)
O2 SAT: 62.8 %
Total hemoglobin: 11.9 g/dL — ABNORMAL LOW (ref 12.0–16.0)

## 2017-11-22 SURGERY — ECHOCARDIOGRAM, TRANSESOPHAGEAL
Anesthesia: General

## 2017-11-22 MED ORDER — SODIUM CHLORIDE 0.9% FLUSH
3.0000 mL | Freq: Two times a day (BID) | INTRAVENOUS | Status: DC
  Administered 2017-11-22 – 2017-11-26 (×8): 3 mL via INTRAVENOUS

## 2017-11-22 MED ORDER — PROPOFOL 10 MG/ML IV BOLUS
INTRAVENOUS | Status: DC | PRN
  Administered 2017-11-22 (×2): 30 mg via INTRAVENOUS

## 2017-11-22 MED ORDER — SODIUM CHLORIDE 0.9 % IV SOLN
INTRAVENOUS | Status: DC | PRN
  Administered 2017-11-22: 13:00:00 via INTRAVENOUS

## 2017-11-22 MED ORDER — DEXMEDETOMIDINE HCL IN NACL 200 MCG/50ML IV SOLN
INTRAVENOUS | Status: DC | PRN
  Administered 2017-11-22: 1 ug/kg/h via INTRAVENOUS

## 2017-11-22 MED ORDER — GABAPENTIN 300 MG PO CAPS
300.0000 mg | ORAL_CAPSULE | Freq: Two times a day (BID) | ORAL | Status: DC
  Administered 2017-11-22 – 2017-11-27 (×10): 300 mg via ORAL
  Filled 2017-11-22 (×10): qty 1

## 2017-11-22 MED ORDER — KETAMINE HCL-SODIUM CHLORIDE 100-0.9 MG/10ML-% IV SOSY
PREFILLED_SYRINGE | INTRAVENOUS | Status: AC
  Filled 2017-11-22: qty 10

## 2017-11-22 MED ORDER — SODIUM CHLORIDE 0.9% FLUSH: 3.0000 mL | INTRAVENOUS | Status: DC | PRN

## 2017-11-22 MED ORDER — SODIUM CHLORIDE 0.9 % IV SOLN: 250.0000 mL | INTRAVENOUS | Status: DC

## 2017-11-22 MED ORDER — KETAMINE HCL 10 MG/ML IJ SOLN
INTRAMUSCULAR | Status: DC | PRN
  Administered 2017-11-22 (×2): 10 mg via INTRAVENOUS
  Administered 2017-11-22: 30 mg via INTRAVENOUS

## 2017-11-22 MED ORDER — SODIUM CHLORIDE 0.9 % IV SOLN: INTRAVENOUS | Status: DC

## 2017-11-22 NOTE — Anesthesia Preprocedure Evaluation (Signed)
Anesthesia Evaluation  Patient identified by MRN, date of birth, ID band Patient awake    Reviewed: Allergy & Precautions, NPO status , Patient's Chart, lab work & pertinent test results  Airway Mallampati: II  TM Distance: >3 FB Neck ROM: Full    Dental no notable dental hx. (+) Poor Dentition, Missing, Caps   Pulmonary former smoker,    Pulmonary exam normal breath sounds clear to auscultation       Cardiovascular hypertension, Pt. on medications +CHF (EF 10%)  Normal cardiovascular exam+ dysrhythmias Atrial Fibrillation  Rhythm:Regular Rate:Normal     Neuro/Psych negative neurological ROS  negative psych ROS   GI/Hepatic negative GI ROS, Neg liver ROS,   Endo/Other  diabetes, Type 2, Insulin Dependent, Oral Hypoglycemic Agents  Renal/GU Renal InsufficiencyRenal diseasenegative Renal ROS  negative genitourinary   Musculoskeletal negative musculoskeletal ROS (+)   Abdominal   Peds negative pediatric ROS (+)  Hematology negative hematology ROS (+)   Anesthesia Other Findings   Reproductive/Obstetrics negative OB ROS                             Anesthesia Physical Anesthesia Plan  ASA: III  Anesthesia Plan: General   Post-op Pain Management:    Induction: Intravenous  PONV Risk Score and Plan: Treatment may vary due to age or medical condition and Ondansetron  Airway Management Planned: Simple Face Mask  Additional Equipment:   Intra-op Plan:   Post-operative Plan:   Informed Consent: I have reviewed the patients History and Physical, chart, labs and discussed the procedure including the risks, benefits and alternatives for the proposed anesthesia with the patient or authorized representative who has indicated his/her understanding and acceptance.   Dental advisory given  Plan Discussed with: CRNA  Anesthesia Plan Comments:         Anesthesia Quick Evaluation

## 2017-11-22 NOTE — Progress Notes (Signed)
Advanced Heart Failure Rounding Note   Subjective:   Spanish speaking. Stratus used for interpretation. Denies SOB. 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 Wife says they never learned to read.   Remains on milrinone 0.375 mcg. Todays CO-OX 63%. Yesterday IV lasix increased to  80 mg IV lasix three times a day.   Denies SOB.      Objective:   Weight Range:  Vital Signs:   Temp:  [98.4 F (36.9 C)-99.6 F (37.6 C)] 99.6 F (37.6 C) (02/05 0754) Pulse Rate:  [88-103] 94 (02/05 0335) Resp:  [16-24] 22 (02/05 0335) BP: (94-117)/(59-87) 103/68 (02/05 0754) SpO2:  [95 %-97 %] 97 % (02/05 0335) Weight:  [181 lb 4.8 oz (82.2 kg)] 181 lb 4.8 oz (82.2 kg) (02/05 0640) Last BM Date: 11/21/17  Weight change: Filed Weights   11/21/17 0354 11/21/17 0416 11/22/17 0640  Weight: 183 lb 13.8 oz (83.4 kg) 182 lb 12.2 oz (82.9 kg) 181 lb 4.8 oz (82.2 kg)    Intake/Output:   Intake/Output Summary (Last 24 hours) at 11/22/2017 0908 Last data filed at 11/22/2017 0700 Gross per 24 hour  Intake 103.6 ml  Output 2276 ml  Net -2172.4 ml     Physical Exam:  General:  Appears chronically ill.  No resp difficulty HEENT: normal Neck: supple. JVP 8-9  Carotids 2+ bilat; no bruits. No lymphadenopathy or thryomegaly appreciated. Cor: PMI nondisplaced. Irregular rate & rhythm. No rubs, gallops or murmurs. Lungs: clear Abdomen: soft, nontender, nondistended. No hepatosplenomegaly. No bruits or masses. Good bowel sounds. Extremities: no cyanosis, clubbing, rash, edema. RUE PICC Neuro: alert & orientedx3, cranial nerves grossly intact. moves all 4 extremities w/o difficulty. Affect pleasant  Telemetry:  Afib 90-110s personally reviewed.    Labs: Basic Metabolic Panel: Recent Labs  Lab 11/17/17 1831 11/18/17 0310 11/19/17 0511 11/20/17 0752 11/21/17 0615 11/22/17 0512  NA 142  --  141 139 138 135  K 3.3*  --  3.2* 3.2* 3.5 3.5  CL 94*  --  96* 96* 98* 95*  CO2 34*  --  32 30  27 27   GLUCOSE 66  --  107* 115* 201* 287*  BUN 59*  --  62* 63* 59* 50*  CREATININE 1.90*  --  2.04* 2.30* 2.18* 2.05*  CALCIUM 9.4  --  9.1 8.8* 8.4* 8.4*  MG  --  2.5*  --  2.2  --   --     Liver Function Tests: Recent Labs  Lab 11/17/17 1831  AST 21  ALT 16*  ALKPHOS 70  BILITOT 0.8  PROT 7.5  ALBUMIN 3.8   Recent Labs  Lab 11/17/17 1831  LIPASE 30   No results for input(s): AMMONIA in the last 168 hours.  CBC: Recent Labs  Lab 11/17/17 1831 11/18/17 0310  WBC 7.1 6.3  NEUTROABS  --  3.6  HGB 13.7 12.3*  HCT 40.3 37.9*  MCV 89.4 90.5  PLT 279 271    Cardiac Enzymes: Recent Labs  Lab 11/18/17 0310 11/18/17 0925 11/18/17 1651  TROPONINI 0.06* 0.06* 0.06*    BNP: BNP (last 3 results) Recent Labs    11/17/17 1831  BNP 1,047.1*    ProBNP (last 3 results) No results for input(s): PROBNP in the last 8760 hours.    Other results:  Imaging: No results found.   Medications:     Scheduled Medications: . alfuzosin  10 mg Oral Daily  . apixaban  5 mg Oral BID  .  atorvastatin  40 mg Oral QHS  . famotidine  20 mg Oral Daily  . furosemide  80 mg Intravenous TID  . gabapentin  300 mg Oral TID  . insulin aspart  0-20 Units Subcutaneous TID WC  . insulin glargine  40 Units Subcutaneous q morning - 10a  . polyethylene glycol  17 g Oral BID  . potassium chloride SA  40 mEq Oral Daily  . sodium chloride flush  3 mL Intravenous Q12H    Infusions: . sodium chloride    . amiodarone 30 mg/hr (11/22/17 0231)  . 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 PMH also includes CKD stage III, DM type II, hyperlipidemia, mild cognitive  impairment, and remote alcohol tobacco abuse.      Plan/Discussion:     1. Acute on Chronic systolic HF -> cardiogenic shock,  - Unclear etiology. Long standing CMP, no h/o of cath, but Nuclear stress 2011 with fixed defect and no ischemia - Echo 07/2016 20%. Echo 11/18/17 shows EF 10%. With severe RV dysfunction  - PICC placed. Co-ox 2/2 33%. Started on milrinone overnight. Co-ox up to 51% now back down to 32% Todays CO-OX 63%. On milrinone 0.375 mcg. Doubt he would be home milrinone candidate.  Volume status improving. Continue IV lasix 80 mg three times a day. Weight down 1 pound.   - Suspect AF may have triggered low output. But nearing end-stage -- Hold coreg with decompensation. No ACE/ARB with AKI - Will need to discuss R/L Cath with him if renal function improver. - Continue amio 30 mg per hour.  - EP evaluated. CRT-D not thought to be beneficial.  - RV dysfunction and social situation likely precludes VAD -Renal function stable.  -Set up TEE/DC-CV   2. Atrial fibrillation - Unclear chronicity. Likely related to his decompensation.  - Most recent EKGs in chart in April/May 2017 show NSR.  - CHADSVaSc is 8. Started Eliquis 2/1 - Continue amiodarone gtt for rate control.   - Set up TEE DC-CV today.   - EP appreciated.   3. Elevated troponin - No ischemia  - 0.06. Flat trend. No ACS. Likely due to HF  4. Acute on CKD III - Creatinine ok 2.3>2.2  Creatinine 2.05.   5. H/o CVA - With cognitive deficits.   - Continue statin. Eliquis started.   6. DNR  7. Illiterate.  Unable to read spanish or english.    Length of Stay: 4  Amy Clegg NP-C  11/22/2017, 9:08 AM  Advanced Heart Failure Team Pager [phone] (M-F; 7a - 4p)  Please contact [facility] for night-coverage after hours (4p -7a ) and weekends on [email or web address]   Patient seen and examined with [name], NP. We discussed all aspects of the encounter. I agree with the assessment and plan as stated above.    Remains in AF with RVR. Co-ox improved with milrinone. I have discussed with Dr. Graciela Husbands who does not feel he would have significant benefit of CRT-D at thsi point with atypical LBBB and cardiogenic shock. Will plan TEE and DCCV today. Continue amio and Eliquis. Will try to wean milrinone soon. Poor candidate for advanced therapies with socail situation .  [name], MD  11:01 AM

## 2017-11-22 NOTE — Anesthesia Procedure Notes (Signed)
Procedure Name: MAC Date/Time: 11/22/2017 1:09 PM Performed by: Barrington Ellison, CRNA Pre-anesthesia Checklist: Patient identified, Emergency Drugs available, Suction available and Patient being monitored Patient Re-evaluated:Patient Re-evaluated prior to induction Oxygen Delivery Method: Nasal cannula

## 2017-11-22 NOTE — Progress Notes (Signed)
PROGRESS NOTE  Stanley Farmer NWG:[id number] DOB: 04/19/1941 DOA: 11/17/2017 PCP: Maris Berger, MD   LOS: 4 days   Brief Narrative / Interim history: Patient is a 77 year old Spanish-speaking male with a systolic CHF, EF 57% dilated cardiomyopathy, CKD stage III, DM type II, hyperlipidemia, and remote  tobacco abuse presented with 3 days complaint of abdominal swelling, shortness of breath with exertion.  BNP 1047, hypoxic with O2 sats 82% with ambulation.  Creatinine 1.9.  Patient was admitted for further workup for CHF exacerbation.  Assessment & Plan: Principal Problem:   Cardiogenic shock ([place]) Active Problems:   Acute on chronic combined systolic and diastolic CHF (congestive heart failure) (HCC)   Essential hypertension   DM type 2 with diabetic peripheral neuropathy (HCC)   Congestive dilated cardiomyopathy (HCC)   Abdominal distension   CKD (chronic kidney disease)   BPH (benign prostatic hyperplasia)   PAF (paroxysmal atrial fibrillation) (HCC)   AKI (acute kidney injury) ([place])   IVCD (intraventricular conduction defect)   Acute on chronic combined systolic and diastolic heart failure ([place]) and  Cardiogenic Shock -Patient presented with abdominal distention, lower extremity swelling, BNP elevated 1047. -Last EF 20% per echo in 10/17, apparently patient had declined AICD placement -Continue IV Lasix diuresis, negative balance of 5,  L, cr at 2.1.  -CHF team following, repeat 2D echo during this hospitalization showed EF of 10% with diffuse hypokinesis -Hold Coreg, No ACE/ARB due to renal insufficiency -Patient started on milrinone, improved Co-ox -Plan for cardiovertion today.   Atrial fibrillation- new onset -New onset A fib.  -Contributing to #1, worsening output -Patient started on amiodarone for rate control.  BP soft and with acute CHF.  -Started on eliquis, Chads vasc 8 -Patient is to get TEE/DCCV   Acute kidney injury on CKD (chronic kidney disease) stage  III -Baseline creatinine 1.2-1.9 over the last year. -secondary to cardiorenal syndrome.  -Good urine out put on milrinone and IV lasix.  -cr 2.3---2.1 stable over last 2 days.   Essential hypertension -BP soft, hold Coreg, enalapril (no ACE/ARB secondary to renal insufficiency)  DM type 2 with diabetic peripheral neuropathy (HCC) -Presented with hypoglycemia glucose 66 on admission, continue Lantus at half dose, sliding scale insulin -Hemoglobin A1c 6.5  BPH (benign prostatic hyperplasia) -Continue alfuzosin  CAD -Continue Plavix, statin  Hyperlipidemia -Continue atorvastatin    DVT prophylaxis: Eliquis Code Status: DNR Family Communication: family at bedside Disposition Plan: home when ready   Consultants:   Cardiology - CHF  Procedures:   2D echo:  Impressions: - LVEF<10%, severely dilated ventricle, global hypokinesis, calcified aortic valve with poor leaflet excursion (mostly due to very low cardiac output), trivial AI, moderate MR with mild mitral stenosis and moderate MAC, moderate biatrial enlargement, severe pulmonary hypertension with RVSP of 76 mmHg, dilated IVC.  Antimicrobials:  None    Subjective: He is breathing better, had small BM. Denies chest pain   Objective: Vitals:   11/22/17 0335 11/22/17 0640 11/22/17 0754 11/22/17 1231  BP: 107/65  103/68 (!) 116/59  Pulse: 94   (!) 115  Resp: (!) 22   (!) 23  Temp: 98.6 F (37 C)  99.6 F (37.6 C)   TempSrc: Oral  Oral   SpO2: 97%   96%  Weight:  82.2 kg (181 lb 4.8 oz)    Height:        Intake/Output Summary (Last 24 hours) at 11/22/2017 1328 Last data filed at 11/22/2017 0700 Gross per 24 hour  Intake -  Output 1576 ml  Net -1576 ml   Filed Weights   11/21/17 0354 11/21/17 0416 11/22/17 0640  Weight: 83.4 kg (183 lb 13.8 oz) 82.9 kg (182 lb 12.2 oz) 82.2 kg (181 lb 4.8 oz)    Examination:  Constitutional: NAD Respiratory: Normal respiratory effort, crackles bases.  Cardiovascular:  S 1, S 2 RRR Abdomen: BS present, soft, nt Skin: no rash  Neurologic: non focal.   Data Reviewed: I have independently reviewed following labs and imaging studies   CBC: Recent Labs  Lab 11/17/17 1831 11/18/17 0310  WBC 7.1 6.3  NEUTROABS  --  3.6  HGB 13.7 12.3*  HCT 40.3 37.9*  MCV 89.4 90.5  PLT 279 412   Basic Metabolic Panel: Recent Labs  Lab 11/17/17 1831 11/18/17 0310 11/19/17 0412 11/20/17 0752 11/21/17 0615 11/22/17 0512  NA 142  --  141 139 138 135  K 3.3*  --  3.2* 3.2* 3.5 3.5  CL 94*  --  96* 96* 98* 95*  CO2 34*  --  32 30 27 27   GLUCOSE 66  --  107* 115* 201* 287*  BUN 59*  --  62* 63* 59* 50*  CREATININE 1.90*  --  2.04* 2.30* 2.18* 2.05*  CALCIUM 9.4  --  9.1 8.8* 8.4* 8.4*  MG  --  2.5*  --  2.2  --   --    GFR: Estimated Creatinine Clearance: 29.2 mL/min (A) (by C-G formula based on SCr of 2.05 mg/dL (H)). Liver Function Tests: Recent Labs  Lab 11/17/17 1831  AST 21  ALT 16*  ALKPHOS 70  BILITOT 0.8  PROT 7.5  ALBUMIN 3.8   Recent Labs  Lab 11/17/17 1831  LIPASE 30   No results for input(s): AMMONIA in the last 168 hours. Coagulation Profile: No results for input(s): INR, PROTIME in the last 168 hours. Cardiac Enzymes: Recent Labs  Lab 11/18/17 0310 11/18/17 0925 11/18/17 1651  TROPONINI 0.06* 0.06* 0.06*   BNP (last 3 results) No results for input(s): PROBNP in the last 8760 hours. HbA1C: Recent Labs    11/20/17 0752  HGBA1C 6.5*   CBG: Recent Labs  Lab 11/21/17 1706 11/21/17 1809 11/21/17 2109 11/22/17 0755 11/22/17 1203  GLUCAP 280* 292* 315* 214* 217*   Lipid Profile: No results for input(s): CHOL, HDL, LDLCALC, TRIG, CHOLHDL, LDLDIRECT in the last 72 hours. Thyroid Function Tests: No results for input(s): TSH, T4TOTAL, FREET4, T3FREE, THYROIDAB in the last 72 hours. Anemia Panel: No results for input(s): VITAMINB12, FOLATE, FERRITIN, TIBC, IRON, RETICCTPCT in the last 72 hours. Urine analysis:      Component Value Date/Time   COLORURINE STRAW (A) 11/17/2017 2330   APPEARANCEUR CLEAR 11/17/2017 2330   LABSPEC 1.008 11/17/2017 2330   PHURINE 8.0 11/17/2017 2330   GLUCOSEU NEGATIVE 11/17/2017 2330   HGBUR NEGATIVE 11/17/2017 2330   BILIRUBINUR NEGATIVE 11/17/2017 2330   KETONESUR NEGATIVE 11/17/2017 2330   PROTEINUR NEGATIVE 11/17/2017 2330   UROBILINOGEN 1.0 08/03/2015 2035   NITRITE NEGATIVE 11/17/2017 2330   LEUKOCYTESUR NEGATIVE 11/17/2017 2330   Sepsis Labs: Invalid input(s): PROCALCITONIN, LACTICIDVEN  Recent Results (from the past 240 hour(s))  MRSA PCR Screening     Status: None   Collection Time: 11/19/17  2:43 PM  Result Value Ref Range Status   MRSA by PCR NEGATIVE NEGATIVE Final    Comment:        The GeneXpert MRSA Assay (FDA approved for NASAL specimens only), is one component of a comprehensive  MRSA colonization surveillance program. It is not intended to diagnose MRSA infection nor to guide or monitor treatment for MRSA infections. Performed at [facility], [place] [address] [place], [place] [address]       Radiology Studies: No results found.   Scheduled Meds: . [MAR Hold] alfuzosin  10 mg Oral Daily  . [MAR Hold] apixaban  5 mg Oral BID  . [MAR Hold] atorvastatin  40 mg Oral QHS  . [MAR Hold] famotidine  20 mg Oral Daily  . [MAR Hold] furosemide  80 mg Intravenous TID  . [MAR Hold] gabapentin  300 mg Oral BID  . [MAR Hold] insulin aspart  0-20 Units Subcutaneous TID WC  . [MAR Hold] insulin glargine  40 Units Subcutaneous q morning - 10a  . [MAR Hold] polyethylene glycol  17 g Oral BID  . [MAR Hold] potassium chloride SA  40 mEq Oral Daily  . [MAR Hold] sodium chloride flush  3 mL Intravenous Q12H  . [MAR Hold] sodium chloride flush  3 mL Intravenous Q12H   Continuous Infusions: . [MAR Hold] sodium chloride    . sodium chloride    . amiodarone 30 mg/hr (11/22/17 0231)  . milrinone 0.375 mcg/kg/min (11/22/17 0503)   Niel Hummer, MD [facility] Pager [phone]  If 7PM-7AM, please contact night-coverage [email or web address] Password [email or web address] 11/22/2017, 1:28 PM

## 2017-11-22 NOTE — Progress Notes (Signed)
Inpatient Diabetes Program Recommendations  AACE/ADA: New Consensus Statement on Inpatient Glycemic Control (2015)  Target Ranges:  Prepandial:   less than 140 mg/dL      Peak postprandial:   less than 180 mg/dL (1-2 hours)      Critically ill patients:  140 - 180 mg/dL   Lab Results  Component Value Date   GLUCAP 214 (H) 11/22/2017   HGBA1C 6.5 (H) 11/20/2017    Review of Glycemic Control  Diabetes history: DM2 Outpatient Diabetes medications: Lantus 80 units qd + Nov 20 units am+28-32 units Lunch + 28-32 units supper Current orders for Inpatient glycemic control: Lantus 40 units + 0-20 units tid  Inpatient Diabetes Program Recommendations:   -Increase Lantus to 60 units qd -Novolog 10 units tid meal coverage if eats 50%  Thank you, Darel Hong E. Josuel Koeppen, RN, MSN, CDE  Diabetes Coordinator Inpatient Glycemic Control Team Team Pager [phone] (8am-5pm) 11/22/2017 9:51 AM

## 2017-11-22 NOTE — Progress Notes (Signed)
ANTICOAGULATION CONSULT NOTE  Pharmacy Consult for Apixaban Indication: atrial fibrillation  No Known Allergies  Patient Measurements: Height: 5\' 4"  (162.6 cm) Weight: 181 lb 4.8 oz (82.2 kg) IBW/kg (Calculated) : 59.2   Vital Signs: Temp: 99.6 F (37.6 C) (02/05 0754) Temp Source: Oral (02/05 0754) BP: 103/68 (02/05 0754) Pulse Rate: 94 (02/05 0335)  Labs: Recent Labs    11/20/17 0752 11/21/17 0615 11/22/17 0512  CREATININE 2.30* 2.18* 2.05*    Estimated Creatinine Clearance: 29.2 mL/min (A) (by C-G formula based on SCr of 2.05 mg/dL (H)).   Medical History: Past Medical History:  Diagnosis Date  . AKI (acute kidney injury) (HCC) 11/20/2017  . CHF (congestive heart failure) (HCC)   . CKD (chronic kidney disease)   . 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 Diabetes mellitus without complication (HCC)   . Hypertension   . 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 PAF (paroxysmal atrial fibrillation) (HCC) 11/20/2017      Assessment: 76yom admitted for HF and new AFib RVR 120s. He was started on amiodarone for rate control and apixaban for anticoagulation. He was on clopidogrel prior to admission which was stopped due to increased risk of bleeding.   No further dose adjustments despite Scr (Scr 2.05 today), due to weight >60kg and age<80 yrs. No signs/symptoms of bleeding. Plans for TEE/DCCV today.    Goal of Therapy:  Monitor platelets by anticoagulation protocol: Yes   Plan:  Continue apixaban 5mg  twice daily Monitor CBC, BMET and s/s bleeding Will continue to follow along with HF team if further dose adjustments are needed  Girard Cooter, PharmD Clinical Pharmacist  Pager: [phone] Clinical Phone for 11/22/2017 until 3:30pm: [phone] If after 3:30pm, please call main pharmacy at [phone] 11/22/2017 12:21 PM

## 2017-11-22 NOTE — H&P (View-Only) (Signed)
Advanced Heart Failure Rounding Note   Subjective:   Spanish speaking. Stratus used for interpretation. Denies SOB. 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 Wife says they never learned to read.   Remains on milrinone 0.375 mcg. Todays CO-OX 63%. Yesterday IV lasix increased to  80 mg IV lasix three times a day.   Denies SOB.      Objective:   Weight Range:  Vital Signs:   Temp:  [98.4 F (36.9 C)-99.6 F (37.6 C)] 99.6 F (37.6 C) (02/05 0754) Pulse Rate:  [88-103] 94 (02/05 0335) Resp:  [16-24] 22 (02/05 0335) BP: (94-117)/(59-87) 103/68 (02/05 0754) SpO2:  [95 %-97 %] 97 % (02/05 0335) Weight:  [181 lb 4.8 oz (82.2 kg)] 181 lb 4.8 oz (82.2 kg) (02/05 0640) Last BM Date: 11/21/17  Weight change: Filed Weights   11/21/17 0354 11/21/17 0416 11/22/17 0640  Weight: 183 lb 13.8 oz (83.4 kg) 182 lb 12.2 oz (82.9 kg) 181 lb 4.8 oz (82.2 kg)    Intake/Output:   Intake/Output Summary (Last 24 hours) at 11/22/2017 0908 Last data filed at 11/22/2017 0700 Gross per 24 hour  Intake 103.6 ml  Output 2276 ml  Net -2172.4 ml     Physical Exam:  General:  Appears chronically ill.  No resp difficulty HEENT: normal Neck: supple. JVP 8-9  Carotids 2+ bilat; no bruits. No lymphadenopathy or thryomegaly appreciated. Cor: PMI nondisplaced. Irregular rate & rhythm. No rubs, gallops or murmurs. Lungs: clear Abdomen: soft, nontender, nondistended. No hepatosplenomegaly. No bruits or masses. Good bowel sounds. Extremities: no cyanosis, clubbing, rash, edema. RUE PICC Neuro: alert & orientedx3, cranial nerves grossly intact. moves all 4 extremities w/o difficulty. Affect pleasant  Telemetry:  Afib 90-110s personally reviewed.    Labs: Basic Metabolic Panel: Recent Labs  Lab 11/17/17 1831 11/18/17 0310 11/19/17 0511 11/20/17 0752 11/21/17 0615 11/22/17 0512  NA 142  --  141 139 138 135  K 3.3*  --  3.2* 3.2* 3.5 3.5  CL 94*  --  96* 96* 98* 95*  CO2 34*  --  32 30  27 27   GLUCOSE 66  --  107* 115* 201* 287*  BUN 59*  --  62* 63* 59* 50*  CREATININE 1.90*  --  2.04* 2.30* 2.18* 2.05*  CALCIUM 9.4  --  9.1 8.8* 8.4* 8.4*  MG  --  2.5*  --  2.2  --   --     Liver Function Tests: Recent Labs  Lab 11/17/17 1831  AST 21  ALT 16*  ALKPHOS 70  BILITOT 0.8  PROT 7.5  ALBUMIN 3.8   Recent Labs  Lab 11/17/17 1831  LIPASE 30   No results for input(s): AMMONIA in the last 168 hours.  CBC: Recent Labs  Lab 11/17/17 1831 11/18/17 0310  WBC 7.1 6.3  NEUTROABS  --  3.6  HGB 13.7 12.3*  HCT 40.3 37.9*  MCV 89.4 90.5  PLT 279 271    Cardiac Enzymes: Recent Labs  Lab 11/18/17 0310 11/18/17 0925 11/18/17 1651  TROPONINI 0.06* 0.06* 0.06*    BNP: BNP (last 3 results) Recent Labs    11/17/17 1831  BNP 1,047.1*    ProBNP (last 3 results) No results for input(s): PROBNP in the last 8760 hours.    Other results:  Imaging: No results found.   Medications:     Scheduled Medications: . alfuzosin  10 mg Oral Daily  . apixaban  5 mg Oral BID  .  atorvastatin  40 mg Oral QHS  . famotidine  20 mg Oral Daily  . furosemide  80 mg Intravenous TID  . gabapentin  300 mg Oral TID  . insulin aspart  0-20 Units Subcutaneous TID WC  . insulin glargine  40 Units Subcutaneous q morning - 10a  . polyethylene glycol  17 g Oral BID  . potassium chloride SA  40 mEq Oral Daily  . sodium chloride flush  3 mL Intravenous Q12H    Infusions: . sodium chloride    . amiodarone 30 mg/hr (11/22/17 0231)  . 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 PMH also includes CKD stage III, DM type II, hyperlipidemia, mild cognitive  impairment, and remote alcohol tobacco abuse.      Plan/Discussion:     1. Acute on Chronic systolic HF -> cardiogenic shock,  - Unclear etiology. Long standing CMP, no h/o of cath, but Nuclear stress 2011 with fixed defect and no ischemia - Echo 07/2016 20%. Echo 11/18/17 shows EF 10%. With severe RV dysfunction  - PICC placed. Co-ox 2/2 33%. Started on milrinone overnight. Co-ox up to 51% now back down to 32% Todays CO-OX 63%. On milrinone 0.375 mcg. Doubt he would be home milrinone candidate.  Volume status improving. Continue IV lasix 80 mg three times a day. Weight down 1 pound.   - Suspect AF may have triggered low output. But nearing end-stage -- Hold coreg with decompensation. No ACE/ARB with AKI - Will need to discuss R/L Cath with him if renal function improver. - Continue amio 30 mg per hour.  - EP evaluated. CRT-D not thought to be beneficial.  - RV dysfunction and social situation likely precludes VAD -Renal function stable.  -Set up TEE/DC-CV   2. Atrial fibrillation - Unclear chronicity. Likely related to his decompensation.  - Most recent EKGs in chart in April/May 2017 show NSR.  - CHADSVaSc is 8. Started Eliquis 2/1 - Continue amiodarone gtt for rate control.   - Set up TEE DC-CV today.   - EP appreciated.   3. Elevated troponin - No ischemia  - 0.06. Flat trend. No ACS. Likely due to HF  4. Acute on CKD III - Creatinine ok 2.3>2.2  Creatinine 2.05.   5. H/o CVA - With cognitive deficits.   - Continue statin. Eliquis started.   6. DNR  7. Illiterate.  Unable to read spanish or english.    Length of Stay: 4  Amy Clegg NP-C  11/22/2017, 9:08 AM  Advanced Heart Failure Team Pager [phone] (M-F; 7a - 4p)  Please contact [facility] for night-coverage after hours (4p -7a ) and weekends on [email or web address]   Patient seen and examined with [name], NP. We discussed all aspects of the encounter. I agree with the assessment and plan as stated above.    Remains in AF with RVR. Co-ox improved with milrinone. I have discussed with Dr. Graciela Husbands who does not feel he would have significant benefit of CRT-D at thsi point with atypical LBBB and cardiogenic shock. Will plan TEE and DCCV today. Continue amio and Eliquis. Will try to wean milrinone soon. Poor candidate for advanced therapies with socail situation .  [name], MD  11:01 AM

## 2017-11-22 NOTE — Transfer of Care (Signed)
Immediate Anesthesia Transfer of Care Note  Patient: Stanley Farmer  Procedure(s) Performed: TRANSESOPHAGEAL ECHOCARDIOGRAM (TEE) (N/A ) CARDIOVERSION (N/A )  Patient Location: Endoscopy Unit  Anesthesia Type:MAC  Level of Consciousness: lethargic and responds to stimulation  Airway & Oxygen Therapy: Patient Spontanous Breathing and Patient connected to nasal cannula oxygen  Post-op Assessment: Report given to RN  Post vital signs: Reviewed and stable  Last Vitals:  Vitals:   11/22/17 0754 11/22/17 1231  BP: 103/68 (!) 116/59  Pulse:  (!) 115  Resp:  (!) 23  Temp: 37.6 C   SpO2:  96%    Last Pain:  Vitals:   11/22/17 0754  TempSrc: Oral  PainSc:          Complications: No apparent anesthesia complications

## 2017-11-22 NOTE — Progress Notes (Signed)
  Echocardiogram Echocardiogram Transesophageal has been performed.  Celene Skeen 11/22/2017, 1:59 PM

## 2017-11-22 NOTE — Anesthesia Postprocedure Evaluation (Signed)
Anesthesia Post Note  Patient: [name]  Procedure(s) Performed: TRANSESOPHAGEAL ECHOCARDIOGRAM (TEE) (N/A ) CARDIOVERSION (N/A )     Patient location during evaluation: Endoscopy Anesthesia Type: General Level of consciousness: awake and alert Pain management: pain level controlled Vital Signs Assessment: post-procedure vital signs reviewed and stable Respiratory status: spontaneous breathing, nonlabored ventilation, respiratory function stable and patient connected to nasal cannula oxygen Cardiovascular status: blood pressure returned to baseline and stable Postop Assessment: no apparent nausea or vomiting Anesthetic complications: no    Last Vitals:  Vitals:   11/22/17 1400 11/22/17 1405  BP: (!) 97/52   Pulse: 75   Resp: 19   Temp:    SpO2: 94% 93%    Last Pain:  Vitals:   11/22/17 1341  TempSrc: Oral  PainSc:                  Stanley Farmer

## 2017-11-22 NOTE — Care Management Note (Signed)
Case Management Note  Patient Details  Name: Stanley Farmer MRN: [id number] Date of Birth: Jun 14, 1941  Subjective/Objective:   Pt admitted with cardiogenic shock                  Action/Plan:  PTA independent from home with wife.  Pt will go home on Eliquis - CM submitted benefit check and provided pt free 30 day card.  Wife states she will get prescription filled at [facility] on [facility].  Pt informed he needs to weigh daily and adhere to low salt diet.  CM discussed above with pt and wife via interpretor.     Expected Discharge Date:                  Expected Discharge Plan:  Home/Self Care  In-House Referral:     Discharge planning Services  CM Consult  Post Acute Care Choice:    Choice offered to:     DME Arranged:    DME Agency:     HH Arranged:    HH Agency:     Status of Service:  In process, will continue to follow  If discussed at Long Length of Stay Meetings, dates discussed:    Additional Comments: Eliquis Benefit Check # 2 Deniece Portela  @ [employer] # [phone]    1.ELIQUIS 2.50 MG BID  COVER- YES  CO-PAY- $ 3.80   TIER- 3 DRUG  PRIOR APPROVAL- NO    2. ELIQUIS 5 MG BID  COVER- YES  CO-PAY- $ 3.80   TIER- 3 DRUG  PRIOR APPROVAL- NO  Cherylann Parr, RN 11/22/2017, 4:08 PM

## 2017-11-22 NOTE — CV Procedure (Signed)
   TRANSESOPHAGEAL ECHOCARDIOGRAM GUIDED DIRECT CURRENT CARDIOVERSION  NAME:  Stanley Farmer   MRN: [id number] DOB:  August 02, 1941   ADMIT DATE: 11/17/2017  INDICATIONS: Atrial fibrillation   PROCEDURE:   Informed consent was obtained prior to the procedure. The risks, benefits and alternatives for the procedure were discussed and the patient comprehended these risks.  Risks include, but are not limited to, cough, sore throat, vomiting, nausea, somnolence, esophageal and stomach trauma or perforation, bleeding, low blood pressure, aspiration, pneumonia, infection, trauma to the teeth and death.    After a procedural time-out, the oropharynx was anesthetized and the patient was sedated by the anesthesia service. The transesophageal probe was inserted in the esophagus and stomach without difficulty and multiple views were obtained.   FINDINGS:  LEFT VENTRICLE: EF = 15-20% Dilated. Severe global  RIGHT VENTRICLE: Moderate diffuse HK  LEFT ATRIUM: Markedly dilated   LEFT ATRIAL APPENDAGE: No clot  RIGHT ATRIUM: Dilated  AORTIC VALVE:  Trileaflet. Mild calcium. No AS/AI  MITRAL VALVE:    Normal Moderate MR  TRICUSPID VALVE: Normal Mild TR  PULMONIC VALVE: Normal Mild PR  INTERATRIAL SEPTUM: No PFO ASD  PERICARDIUM: Small effusion  DESCENDING AORTA: Mild plaque   CARDIOVERSION:     Indications:  Atrial Fibrillation  Procedure Details:  Once the TEE was complete, the patient had the defibrillator pads placed in the anterior and posterior position. Once an appropriate level of sedation was achieved, the patient received a single biphasic, synchronized 200J shock with prompt conversion to sinus rhythm. No apparent complications.   [name], MD  1:58 PM

## 2017-11-22 NOTE — Progress Notes (Signed)
PT Cancellation Note  Patient Details Name: Stanley Farmer MRN: [id number] DOB: Apr 25, 1941   Cancelled Treatment:    Reason Eval/Treat Not Completed: Patient at procedure or test/unavailable. Will follow-up for PT treatment as time allows.  Ina Homes, PT, DPT Acute Rehab Services  Pager: [phone]  Malachy Chamber 11/22/2017, 12:40 PM

## 2017-11-22 NOTE — Interval H&P Note (Signed)
History and Physical Interval Note:  11/22/2017 12:58 PM  Stanley Farmer  has presented today for surgery, with the diagnosis of afib  The various methods of treatment have been discussed with the patient and family. After consideration of risks, benefits and other options for treatment, the patient has consented to  Procedure(s): TRANSESOPHAGEAL ECHOCARDIOGRAM (TEE) (N/A) CARDIOVERSION (N/A) as a surgical intervention .  The patient's history has been reviewed, patient examined, no change in status, stable for surgery.  I have reviewed the patient's chart and labs.  Questions were answered to the patient's satisfaction.     [name]

## 2017-11-23 ENCOUNTER — Encounter (HOSPITAL_COMMUNITY): Payer: Self-pay | Admitting: Internal Medicine

## 2017-11-23 LAB — BASIC METABOLIC PANEL
ANION GAP: 12 (ref 5–15)
BUN: 42 mg/dL — AB (ref 6–20)
CALCIUM: 8.6 mg/dL — AB (ref 8.9–10.3)
CO2: 26 mmol/L (ref 22–32)
Chloride: 100 mmol/L — ABNORMAL LOW (ref 101–111)
Creatinine, Ser: 2.2 mg/dL — ABNORMAL HIGH (ref 0.61–1.24)
GFR calc Af Amer: 31 mL/min — ABNORMAL LOW (ref >60)
GFR, EST NON AFRICAN AMERICAN: 27 mL/min — AB (ref >60)
GLUCOSE: 137 mg/dL — AB (ref 65–99)
Potassium: 3.6 mmol/L (ref 3.5–5.1)
Sodium: 138 mmol/L (ref 135–145)

## 2017-11-23 LAB — GLUCOSE, CAPILLARY
GLUCOSE-CAPILLARY: 143 mg/dL — AB (ref 65–99)
Glucose-Capillary: 133 mg/dL — ABNORMAL HIGH (ref 65–99)
Glucose-Capillary: 300 mg/dL — ABNORMAL HIGH (ref 65–99)
Glucose-Capillary: 149 mg/dL — ABNORMAL HIGH (ref 65–99)

## 2017-11-23 LAB — COOXEMETRY PANEL
CARBOXYHEMOGLOBIN: 1.6 % — AB (ref 0.5–1.5)
Methemoglobin: 1.1 % (ref 0.0–1.5)
O2 Saturation: 65.3 %
Total hemoglobin: 11.8 g/dL — ABNORMAL LOW (ref 12.0–16.0)

## 2017-11-23 MED ORDER — SENNA 8.6 MG PO TABS
1.0000 | ORAL_TABLET | Freq: Every day | ORAL | Status: DC
  Administered 2017-11-24 – 2017-11-27 (×4): 8.6 mg via ORAL
  Filled 2017-11-23 (×4): qty 1

## 2017-11-23 MED ORDER — FUROSEMIDE 10 MG/ML IJ SOLN
80.0000 mg | Freq: Two times a day (BID) | INTRAMUSCULAR | Status: DC
  Administered 2017-11-23 – 2017-11-24 (×2): 80 mg via INTRAVENOUS
  Filled 2017-11-23 (×2): qty 8

## 2017-11-23 MED ORDER — MILRINONE LACTATE IN DEXTROSE 20-5 MG/100ML-% IV SOLN
0.2500 ug/kg/min | INTRAVENOUS | Status: DC
  Administered 2017-11-23 – 2017-11-24 (×2): 0.25 ug/kg/min via INTRAVENOUS
  Filled 2017-11-23 (×2): qty 100

## 2017-11-23 NOTE — Progress Notes (Signed)
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  BNP 1047, hypoxic with O2 sats 82% with ambulation.  Creatinine 1.9.  Patient was admitted for further workup for CHF exacerbation.  Patient admitted with cardiogenic shock, new A. fib. He was started on milrinone, IV lasix. He also has acute on chronic renal failure. Cr and renal function stable. He underwent cardioversion 2-05, with hope this will improved Heart Function.   Assessment & Plan: Principal Problem:   Cardiogenic shock ([place]) Active Problems:   Acute on chronic combined systolic and diastolic CHF (congestive heart failure) (HCC)   Essential hypertension   DM type 2 with diabetic peripheral neuropathy (HCC)   Congestive dilated cardiomyopathy (HCC)   Abdominal distension   CKD (chronic kidney disease)   BPH (benign prostatic hyperplasia)   PAF (paroxysmal atrial fibrillation) (HCC)   AKI (acute kidney injury) ([place])   IVCD (intraventricular conduction defect)   Acute on chronic combined systolic and diastolic heart failure ([place]) and  Cardiogenic Shock -Patient presented with abdominal distention, lower extremity swelling, BNP elevated 1047. -Last EF 20% per echo in 10/17, apparently patient had declined AICD placement -Continue IV Lasix diuresis, negative balance of 5,  L, cr at 2.1.  -CHF team following, repeat 2D echo during this hospitalization showed EF of 10% with diffuse hypokinesis -Hold Coreg, No ACE/ARB due to renal insufficiency -Patient started on milrinone, improved Co-ox -underwent cardioversion 2-05.  Atrial fibrillation- new  onset; post  cardioversion 2-05 -New onset A fib.  -Contributing to #1, worsening output -Patient started on amiodarone for rate control.  BP soft and with acute CHF.  -Started on eliquis, Chads vasc 8 -Patient is to get TEE/DCCV , no complication during procedure.   Acute kidney injury on CKD (chronic kidney disease) stage III -Baseline creatinine 1.2-1.9 over the last year. -secondary to cardiorenal syndrome.  -Good urine out put on milrinone and IV lasix.  -cr 2.3---2.1 stable over last 2 days.   Essential hypertension -BP soft, hold Coreg, enalapril (no ACE/ARB secondary to renal insufficiency)  DM type 2 with diabetic peripheral neuropathy (HCC) -Presented with hypoglycemia glucose 66 on admission, continue Lantus at half dose, sliding scale insulin -Hemoglobin A1c 6.5  BPH (benign prostatic hyperplasia) -Continue alfuzosin  CAD -Continue Plavix, statin  Hyperlipidemia -Continue atorvastatin  Constipation; continue with Miralax. Add senokot. No significant BM yet,   DVT prophylaxis: Eliquis Code Status: DNR Family Communication: family at bedside Disposition Plan: home when ready   Consultants:   Cardiology - CHF  Procedures:   2D echo:  Impressions: - LVEF<10%, severely dilated ventricle, global hypokinesis, calcified aortic valve with poor leaflet excursion (mostly due to very low cardiac output), trivial AI, moderate MR with mild mitral stenosis and moderate MAC, moderate biatrial enlargement, severe pulmonary hypertension with RVSP of 76 mmHg, dilated IVC.  Antimicrobials:  None    Subjective: He is feeling better, dyspnea improved. Feels less bloated. He has not had a BM.  He is wondering, when he is going home. He would like for somebody to order breakfast. His nurse order his breakfast   Objective: Vitals:   11/22/17 1933 11/22/17 2351 11/23/17 0324 11/23/17 0700  BP: 105/66 103/63 112/61   Pulse: 79 81 83   Resp: (!) 23  20   Temp: 98.3 F (36.8 C) 99.2  F (37.3 C) 98.7 F (37.1 C) 98 F (36.7 C)  TempSrc: Oral Oral Oral Oral  SpO2: 95% 92% 93%   Weight:   82.4 kg (181 lb 9.6 oz)   Height:        Intake/Output Summary (Last 24 hours) at 11/23/2017 0802 Last data filed at 11/23/2017 0324 Gross per 24 hour  Intake 650.8 ml  Output 1050 ml  Net -399.2 ml   Filed Weights   11/21/17 0416 11/22/17 0640 11/23/17 0324  Weight: 82.9 kg (182 lb 12.2 oz) 82.2 kg (181 lb 4.8 oz) 82.4 kg (181 lb 9.6 oz)    Examination:  Constitutional: NAD Respiratory: Normal respiratory effort. Crackles bases.  Cardiovascular: S 1, S 2 RRR Abdomen: BS present, soft,  nt Skin: No rash  Neurologic: non focal.   Data Reviewed: I have independently reviewed following labs and imaging studies   CBC: Recent Labs  Lab 11/17/17 1831 11/18/17 0310  WBC 7.1 6.3  NEUTROABS  --  3.6  HGB 13.7 12.3*  HCT 40.3 37.9*  MCV 89.4 90.5  PLT 279 347   Basic Metabolic Panel: Recent Labs  Lab 11/18/17 0310 11/19/17 0412 11/20/17 0752 11/21/17 0615 11/22/17 0512 11/23/17 0330  NA  --  141 139 138 135 138  K  --  3.2* 3.2* 3.5 3.5 3.6  CL  --  96* 96* 98* 95* 100*  CO2  --  32 30 27 27 26   GLUCOSE  --  107* 115* 201* 287* 137*  BUN  --  62* 63* 59* 50* 42*  CREATININE  --  2.04* 2.30* 2.18* 2.05* 2.20*  CALCIUM  --  9.1 8.8* 8.4* 8.4* 8.6*  MG 2.5*  --  2.2  --   --   --    GFR: Estimated Creatinine Clearance: 27.2 mL/min (A) (by C-G formula based on SCr of 2.2 mg/dL (H)). Liver Function Tests: Recent Labs  Lab 11/17/17 1831  AST 21  ALT 16*  ALKPHOS 70  BILITOT 0.8  PROT 7.5  ALBUMIN 3.8   Recent Labs  Lab 11/17/17 1831  LIPASE 30   No results for input(s): AMMONIA in the last 168 hours. Coagulation Profile: No results for input(s): INR, PROTIME in the last 168 hours. Cardiac Enzymes: Recent Labs  Lab 11/18/17 0310 11/18/17 0925 11/18/17 1651  TROPONINI 0.06* 0.06* 0.06*   BNP (last 3 results) No results for input(s): PROBNP in  the last 8760 hours. HbA1C: No results for input(s): HGBA1C in the last 72 hours. CBG: Recent Labs  Lab 11/22/17 0755 11/22/17 1203 11/22/17 1718 11/22/17 2124 11/23/17 0735  GLUCAP 214* 217* 267* 201* 133*   Lipid Profile: No results for input(s): CHOL, HDL, LDLCALC, TRIG, CHOLHDL, LDLDIRECT in the last 72 hours. Thyroid Function Tests: No results for input(s): TSH, T4TOTAL, FREET4, T3FREE, THYROIDAB in the last 72 hours. Anemia Panel: No results for input(s): VITAMINB12, FOLATE, FERRITIN, TIBC, IRON, RETICCTPCT in the last 72 hours. Urine analysis:    Component Value Date/Time   COLORURINE STRAW (A) 11/17/2017 2330   APPEARANCEUR CLEAR 11/17/2017 2330   LABSPEC 1.008 11/17/2017 2330   PHURINE 8.0 11/17/2017 2330   GLUCOSEU NEGATIVE 11/17/2017 2330   HGBUR NEGATIVE 11/17/2017 2330   BILIRUBINUR NEGATIVE 11/17/2017 2330   KETONESUR NEGATIVE 11/17/2017 2330   PROTEINUR NEGATIVE 11/17/2017 2330   UROBILINOGEN 1.0  08/03/2015 2035   NITRITE NEGATIVE 11/17/2017 2330   LEUKOCYTESUR NEGATIVE 11/17/2017 2330   Sepsis Labs: Invalid input(s): PROCALCITONIN, LACTICIDVEN  Recent Results (from the past 240 hour(s))  MRSA PCR Screening     Status: None   Collection Time: 11/19/17  2:43 PM  Result Value Ref Range Status   MRSA by PCR NEGATIVE NEGATIVE Final    Comment:        The GeneXpert MRSA Assay (FDA approved for NASAL specimens only), is one component of a comprehensive MRSA colonization surveillance program. It is not intended to diagnose MRSA infection nor to guide or monitor treatment for MRSA infections. Performed at [facility], [place] [address], [place], [place] [address]       Radiology Studies: No results found.   Scheduled Meds: . alfuzosin  10 mg Oral Daily  . apixaban  5 mg Oral BID  . atorvastatin  40 mg Oral QHS  . famotidine  20 mg Oral Daily  . furosemide  80 mg Intravenous TID  . gabapentin  300 mg Oral BID  . insulin aspart  0-20 Units  Subcutaneous TID WC  . insulin glargine  40 Units Subcutaneous q morning - 10a  . polyethylene glycol  17 g Oral BID  . potassium chloride SA  40 mEq Oral Daily  . sodium chloride flush  3 mL Intravenous Q12H  . sodium chloride flush  3 mL Intravenous Q12H   Continuous Infusions: . sodium chloride    . sodium chloride    . sodium chloride    . amiodarone 30 mg/hr (11/23/17 0002)  . milrinone 0.375 mcg/kg/min (11/23/17 0235)   Niel Hummer, MD [facility] Pager [phone]  If 7PM-7AM, please contact night-coverage [email or web address] Password [email or web address] 11/23/2017, 8:02 AM

## 2017-11-23 NOTE — Progress Notes (Signed)
Advanced Heart Failure Rounding Note   Subjective:   Spanish speaking. Stratus used for interpretation. Denies SOB. 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 Wife says they never learned to read.   Yesterday he underwent TEE/DCCV with conversion to NSR. Remains on milrinone 0.375 mcg + amio 30 mg per hour.   CVP 7-8  Denies SOB. Feeling better. Co-ox 65%  Creatinine 2.0-> 2.2  Objective:   Weight Range:  Vital Signs:   Temp:  [98 F (36.7 C)-99.2 F (37.3 C)] 98 F (36.7 C) (02/06 0700) Pulse Rate:  [75-115] 83 (02/06 0324) Resp:  [17-23] 20 (02/06 0324) BP: (90-116)/(45-68) 112/61 (02/06 0324) SpO2:  [91 %-98 %] 93 % (02/06 0324) Weight:  [181 lb 9.6 oz (82.4 kg)] 181 lb 9.6 oz (82.4 kg) (02/06 0324) Last BM Date: 11/21/17  Weight change: Filed Weights   11/21/17 0416 11/22/17 0640 11/23/17 0324  Weight: 182 lb 12.2 oz (82.9 kg) 181 lb 4.8 oz (82.2 kg) 181 lb 9.6 oz (82.4 kg)    Intake/Output:   Intake/Output Summary (Last 24 hours) at 11/23/2017 1019 Last data filed at 11/23/2017 0324 Gross per 24 hour  Intake 650.8 ml  Output 1050 ml  Net -399.2 ml     Physical Exam: CVP 7-8  General:  No resp difficulty Sitting in bed HEENT: normal Neck: supple. JVP 7-8  Carotids 2+ bilat; no bruits. No lymphadenopathy or thryomegaly appreciated. Cor: PMI laterally displaced. Regular rate & rhythm. +s3 Lungs: clear Abdomen: soft, nontender, nondistended. No hepatosplenomegaly. No bruits or masses. Good bowel sounds. Extremities: no cyanosis, clubbing, rash, edema. RUE PICC.  Warm  Neuro: alert & orientedx3, cranial nerves grossly intact. moves all 4 extremities w/o difficulty. Affect pleasant  Telemetry:  NSR 80-90s  Personally reviewed   Labs: Basic Metabolic Panel: Recent Labs  Lab 11/18/17 0310 11/19/17 0412 11/20/17 0752 11/21/17 0615 11/22/17 0512 11/23/17 0330  NA  --  141 139 138 135 138  K  --  3.2* 3.2* 3.5 3.5 3.6  CL  --  96* 96* 98* 95* 100*   CO2  --  32 30 27 27 26   GLUCOSE  --  107* 115* 201* 287* 137*  BUN  --  62* 63* 59* 50* 42*  CREATININE  --  2.04* 2.30* 2.18* 2.05* 2.20*  CALCIUM  --  9.1 8.8* 8.4* 8.4* 8.6*  MG 2.5*  --  2.2  --   --   --     Liver Function Tests: Recent Labs  Lab 11/17/17 1831  AST 21  ALT 16*  ALKPHOS 70  BILITOT 0.8  PROT 7.5  ALBUMIN 3.8   Recent Labs  Lab 11/17/17 1831  LIPASE 30   No results for input(s): AMMONIA in the last 168 hours.  CBC: Recent Labs  Lab 11/17/17 1831 11/18/17 0310  WBC 7.1 6.3  NEUTROABS  --  3.6  HGB 13.7 12.3*  HCT 40.3 37.9*  MCV 89.4 90.5  PLT 279 271    Cardiac Enzymes: Recent Labs  Lab 11/18/17 0310 11/18/17 0925 11/18/17 1651  TROPONINI 0.06* 0.06* 0.06*    BNP: BNP (last 3 results) Recent Labs    11/17/17 1831  BNP 1,047.1*    ProBNP (last 3 results) No results for input(s): PROBNP in the last 8760 hours.    Other results:  Imaging: No results found.   Medications:     Scheduled Medications: . alfuzosin  10 mg Oral Daily  . apixaban  5 mg Oral BID  . atorvastatin  40 mg Oral QHS  . famotidine  20 mg Oral Daily  . furosemide  80 mg Intravenous BID  . gabapentin  300 mg Oral BID  . insulin aspart  0-20 Units Subcutaneous TID WC  . insulin glargine  40 Units Subcutaneous q morning - 10a  . polyethylene glycol  17 g Oral BID  . potassium chloride SA  40 mEq Oral Daily  . senna  1 tablet Oral Daily  . sodium chloride flush  3 mL Intravenous Q12H  . sodium chloride flush  3 mL Intravenous Q12H    Infusions: . sodium chloride    . sodium chloride    . sodium chloride    . amiodarone 30 mg/hr (11/23/17 0002)  . milrinone      PRN Medications: sodium chloride, acetaminophen, bisacodyl, ondansetron (ZOFRAN) IV, sodium chloride flush, sodium chloride flush, sodium chloride flush   Assessment:    Stanley Pinedais a 77 y.o.Spanish speakingmalewith medical history significantfor combined HF, NICM, EF  10-15%, with moderately reduced RV function on 01/16/16. PMH also includes CKD stage III, DM type II, hyperlipidemia, mild cognitive impairment, and remote alcohol tobacco abuse.      Plan/Discussion:     1. Acute on Chronic systolic HF -> cardiogenic shock,  - Unclear etiology. Long standing CMP, no h/o of cath, but Nuclear stress 2011 with fixed defect and no ischemia - Echo 07/2016 20%. Echo 11/18/17 shows EF 10%. With severe RV dysfunction  - PICC placed. Co-ox 2/2 33%. Started on milrinone overnight. Co-ox up to 51% now back down to 32% Todays CO-OX is 65%.CVP 7-8  -Cut back milrinone to 0.25 mcg. CO-OX in am. Continue to wean milrinone over the next 24-48 hours.  -Cut back IV lasix to 80 mg twice a day.   - Suspect AF may have triggered low output. But nearing end-stage -- Hold coreg with decompensation. No ACE/ARB with AKI - Will need to discuss R/L Cath with him if renal function improver. - EP evaluated. CRT-D not thought to be beneficial.  - RV dysfunction and social situation likely precludes VAD  2. Atrial fibrillation - Unclear chronicity. Likely related to his decompensation.  - Most recent EKGs in chart in April/May 2017 show NSR.  - CHADSVaSc is 8. Started Eliquis 2/1 S/P TEE DC-CV on 2/5. Maintaining NSR.  - EP appreciated.  - Plan to continue IV amio while on milrinone.   3. Elevated troponin - No ischemia  - 0.06. Flat trend. No ACS. Likely due to HF  4. Acute on CKD III - Creatinine ok 2.3>2.2  Creatinine 2.2  BMET in am.   5. H/o CVA - With cognitive deficits.   - Continue statin. Eliquis started.   6. DNR  7. Illiterate.  Unable to read spanish or english.   Consult [facility]ia rehab.    Length of Stay: 5  Amy Clegg NP-C  11/23/2017, 10:19 AM  Advanced Heart Failure Team Pager [phone] (M-F; 7a - 4p)  Please contact [facility] for night-coverage after hours (4p -7a ) and weekends on [email or web address]  Patient seen and examined with [name],  NP. We discussed all aspects of the encounter. I agree with the assessment and plan as stated above.   He underwent successful DC-CV yesterday. Remains in NSR on amio. Feels better today but still tenuous. Will wean milrinone to 0.25. Creatinine is worse. Will stop IV lasix. Switch to po tomorrow. Continue Eliquis. Given social situation not  candidate for advanced therapies or home inotropes.   [name], MD  6:53 PM

## 2017-11-23 NOTE — Progress Notes (Signed)
Pt BP trending down, current 89/58 (68). TRH paged, no further interventions at this time. Pt to remain on amio gtt. Will continue to monitor.

## 2017-11-23 NOTE — Progress Notes (Signed)
Physical Therapy Treatment Patient Details Name: Stanley Farmer MRN: [id number] DOB: [birth year] Today's Date: 11/23/2017    History of Present Illness Pt is a 77 y.o. Spanish-speaking male admitted 11/17/17 with abdominal pain, swelling, and SOB; worked up for CHF exacerbation. 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      PT Comments    Pt performed decreased activity from initial evaluation and presents with a weaker presentation.  Pt required increased time and reports pain in B knees.  Vital stable and close chair follow required for gait training as patient reports his knees feel like they are going to buckle.    Follow Up Recommendations  Home health PT;Supervision/Assistance - 24 hour     Equipment Recommendations  None recommended by PT    Recommendations for Other Services       Precautions / Restrictions Precautions Precautions: Fall Restrictions Weight Bearing Restrictions: No    Mobility  Bed Mobility Overal bed mobility: Needs Assistance Bed Mobility: Supine to Sit     Supine to sit: Mod assist     General bed mobility comments: Pt required assistance for LE advancement and trunk elevation.    Transfers Overall transfer level: Needs assistance Equipment used: Rolling walker (2 wheeled) Transfers: Sit to/from Stand Sit to Stand: Mod assist         General transfer comment: Cues for hand placement to and from seated surface.  Pt slow to ascend due to painful knees.  Able to follow commands for safe hand placement.    Ambulation/Gait Ambulation/Gait assistance: Mod assist Ambulation Distance (Feet): 15 Feet Assistive device: Rolling walker (2 wheeled) Gait Pattern/deviations: Step-to pattern;Trunk flexed;Antalgic;Shuffle;Decreased stride length     General Gait Details: Poor foot clearance, cues for equal strides and forward gaze to correct posture.  B knees remains flexed due to pain.      Stairs            Wheelchair Mobility    Modified Rankin (Stroke Patients Only)       Balance Overall balance assessment: Needs assistance   Sitting balance-Leahy Scale: Good       Standing balance-Leahy Scale: Poor Standing balance comment: Reliant on UE support                            Cognition Arousal/Alertness: Awake/alert Behavior During Therapy: Flat affect Overall Cognitive Status: Difficult to assess                                        Exercises      General Comments        Pertinent Vitals/Pain Pain Assessment: 0-10 Pain Score: 8  Pain Location: B knees Pain Descriptors / Indicators: Sore Pain Intervention(s): Monitored during session;Repositioned    Home Living                      Prior Function            PT Goals (current goals can now be found in the care plan section) Acute Rehab PT Goals Patient Stated Goal: Return home Potential to Achieve Goals: Good Progress towards PT goals: Progressing toward goals    Frequency    Min 3X/week      PT Plan Current plan remains appropriate  Co-evaluation              AM-PAC PT "6 Clicks" Daily Activity  Outcome Measure  Difficulty turning over in bed (including adjusting bedclothes, sheets and blankets)?: None Difficulty moving from lying on back to sitting on the side of the bed? : Unable Difficulty sitting down on and standing up from a chair with arms (e.g., wheelchair, bedside commode, etc,.)?: Unable Help needed moving to and from a bed to chair (including a wheelchair)?: A Lot Help needed walking in hospital room?: A Lot Help needed climbing 3-5 steps with a railing? : A Little 6 Click Score: 13    End of Session Equipment Utilized During Treatment: Gait belt Activity Tolerance: Patient tolerated treatment well;Patient limited by fatigue Patient left: with call bell/phone within reach;in bed;with family/visitor  present Nurse Communication: Mobility status PT Visit Diagnosis: Other abnormalities of gait and mobility (R26.89)     Time: 9211-9417 PT Time Calculation (min) (ACUTE ONLY): 18 min  Charges:  $Therapeutic Activity: 8-22 mins                    G Codes:       Joycelyn Rua, PTA pager [phone]    Florestine Avers 11/23/2017, 6:03 PM

## 2017-11-24 DIAGNOSIS — I10 Essential (primary) hypertension: Secondary | ICD-10-CM

## 2017-11-24 DIAGNOSIS — E1142 Type 2 diabetes mellitus with diabetic polyneuropathy: Secondary | ICD-10-CM

## 2017-11-24 LAB — CBC
HEMATOCRIT: 33.9 % — AB (ref 39.0–52.0)
Hemoglobin: 11.3 g/dL — ABNORMAL LOW (ref 13.0–17.0)
MCH: 29.7 pg (ref 26.0–34.0)
MCHC: 33.3 g/dL (ref 30.0–36.0)
MCV: 89.2 fL (ref 78.0–100.0)
PLATELETS: 203 10*3/uL (ref 150–400)
RBC: 3.8 MIL/uL — ABNORMAL LOW (ref 4.22–5.81)
RDW: 14.5 % (ref 11.5–15.5)
WBC: 6.1 10*3/uL (ref 4.0–10.5)

## 2017-11-24 LAB — BASIC METABOLIC PANEL
Anion gap: 12 (ref 5–15)
BUN: 38 mg/dL — AB (ref 6–20)
CO2: 26 mmol/L (ref 22–32)
Calcium: 8.4 mg/dL — ABNORMAL LOW (ref 8.9–10.3)
Chloride: 99 mmol/L — ABNORMAL LOW (ref 101–111)
Creatinine, Ser: 2.05 mg/dL — ABNORMAL HIGH (ref 0.61–1.24)
GFR calc Af Amer: 34 mL/min — ABNORMAL LOW (ref >60)
GFR, EST NON AFRICAN AMERICAN: 30 mL/min — AB (ref >60)
Glucose, Bld: 159 mg/dL — ABNORMAL HIGH (ref 65–99)
POTASSIUM: 3.5 mmol/L (ref 3.5–5.1)
Sodium: 137 mmol/L (ref 135–145)

## 2017-11-24 LAB — GLUCOSE, CAPILLARY
GLUCOSE-CAPILLARY: 247 mg/dL — AB (ref 65–99)
GLUCOSE-CAPILLARY: 221 mg/dL — AB (ref 65–99)
GLUCOSE-CAPILLARY: 95 mg/dL (ref 65–99)
Glucose-Capillary: 231 mg/dL — ABNORMAL HIGH (ref 65–99)

## 2017-11-24 LAB — COOXEMETRY PANEL
Carboxyhemoglobin: 1.7 % — ABNORMAL HIGH (ref 0.5–1.5)
Methemoglobin: 1.2 % (ref 0.0–1.5)
O2 Saturation: 72.2 %
TOTAL HEMOGLOBIN: 11.2 g/dL — AB (ref 12.0–16.0)

## 2017-11-24 MED ORDER — MILRINONE LACTATE IN DEXTROSE 20-5 MG/100ML-% IV SOLN
0.1250 ug/kg/min | INTRAVENOUS | Status: DC
  Administered 2017-11-25: 0.125 ug/kg/min via INTRAVENOUS
  Filled 2017-11-24: qty 100

## 2017-11-24 MED ORDER — INSULIN GLARGINE 100 UNIT/ML ~~LOC~~ SOLN
50.0000 [IU] | Freq: Every morning | SUBCUTANEOUS | Status: DC
  Administered 2017-11-25 – 2017-11-27 (×3): 50 [IU] via SUBCUTANEOUS
  Filled 2017-11-24 (×3): qty 0.5

## 2017-11-24 MED ORDER — TORSEMIDE 20 MG PO TABS
40.0000 mg | ORAL_TABLET | Freq: Two times a day (BID) | ORAL | Status: DC
  Administered 2017-11-24 – 2017-11-25 (×2): 40 mg via ORAL
  Filled 2017-11-24 (×2): qty 2

## 2017-11-24 NOTE — Progress Notes (Signed)
Talked with granddaughter who speaks english about pts living situation, pt lives with wife with no one else in the household, however she does tell me that there is an english speaking son who lives across the street who may be able to help with medication administration.

## 2017-11-24 NOTE — Progress Notes (Signed)
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 BNP 1047, hypoxic with O2 sats 82% with ambulation. Creatinine 1.9. Patient was admitted for further workup for CHF exacerbation.  Patient admitted with cardiogenic shock, new A. fib. He was started on milrinone, IV lasix. He also has acute on chronic renal failure. Cr and renal function stable. He underwent cardioversion 2-05, with hope this will improved Heart Function.   Subjective: Patient seen and examined, report feeling much better, his breathing has improved.  Has lost a lot of weight and he is legs are not swelling anymore.  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 -Last EF 20% per echo in 10/17, apparently patient had declined AICD placement -CHF team following, repeat 2D echo during this hospitalization showed EF of 10% with diffuse hypokinesis -Hold Coreg,No ACE/ARBdue to renal insufficiency -Patient on milrinone, cardiology tapering down. -Started on torsemide 40 mg twice a day -Patient underwent cardioversion with no issues.  Atrial fibrillation- new onset; post cardioversion 2-05 -New onset A fib.  -Contributing to CHF -Cardiology recommending to continue IV I amiodarone while on milrinone -Patient on Eliquis -Maintaining NSR status post TEE DCCV  2/5  Acute kidney injury on CKD (chronic kidney disease) stage III -Baseline creatinine 1.2-1.9  -Cardiorenal syndrome -Remain with good urine  output -Creatinine improved today -Continue to monitor renal function  Essential hypertension -BP stable Coreg on hold will defer to cardiology for when to begin -No ACE at this time given renal insufficiency  DM type 2 with diabetic peripheral neuropathy (HCC) -CBG elevated, patient on half dose from home insulin due to hypoglycemic episode on admission -Increase Lantus to 50 units -Continue SSI -Hemoglobin A1c 6.5  BPH (benign prostatic hyperplasia) -Continue alfuzosin  CAD -Continue Plavix, statin  Hyperlipidemia -Continue atorvastatin  Constipation; continue with Miralax. Add senokot. No significant BM yet,   DVT prophylaxis: Eliquis Code Status: DNR Family Communication: Wife at bedside Disposition Plan: Home when cleared by cardiology  Consultants:   Cardiology CHF team  Procedures:   Echo, TEE DCCV  Antimicrobials:  None   Objective: Vitals:   11/23/17 2316 11/24/17 0334 11/24/17 0400 11/24/17 0700  BP: (!) 100/52  100/64   Pulse: 73  74   Resp:   18   Temp: 98.3 F (36.8 C) 98.1 F (36.7 C) 98.1 F (36.7 C) 98.6 F (37 C)  TempSrc: Oral Oral Oral Oral  SpO2: 94%  94%   Weight:  82.7 kg (182 lb 4.8 oz)    Height:        Intake/Output Summary (Last 24 hours) at 11/24/2017 0845 Last data filed at 11/24/2017 0800 Gross per 24 hour  Intake 444.1 ml  Output 1600 ml  Net -1155.9 ml   Filed Weights   11/22/17 0640 11/23/17 0324 11/24/17 0334  Weight: 82.2 kg (181 lb 4.8 oz) 82.4 kg (181 lb 9.6 oz) 82.7 kg (182 lb 4.8 oz)    Examination:  General exam: Appears calm and comfortable  HEENT: OP moist and clear Respiratory system: Good air entry, bibasilar crackles  Cardiovascular system: S1 & S2 heard, RRR. No JVD, murmurs, rubs or gallops Gastrointestinal system: Abdomen is nondistended, soft and nontender.  Central nervous system: Alert and oriented. No focal neurological deficits. Extremities: No pedal edema.  Skin: No rashes, lesions  or ulcers Psychiatry: Mood & affect appropriate.    Data Reviewed: I have personally reviewed following labs and imaging studies  CBC: Recent Labs  Lab 11/17/17 1831 11/18/17 0310 11/24/17 0526  WBC 7.1 6.3 6.1  NEUTROABS  --  3.6  --   HGB 13.7 12.3* 11.3*  HCT 40.3 37.9* 33.9*  MCV 89.4 90.5 89.2  PLT 279 271 203   Basic Metabolic Panel: Recent Labs  Lab 11/18/17 0310  11/20/17 0752 11/21/17 0615 11/22/17 0512 11/23/17 0330 11/24/17 0526  NA  --    < > 139 138 135 138 137  K  --    < > 3.2* 3.5 3.5 3.6 3.5  CL  --    < > 96* 98* 95* 100* 99*  CO2  --    < > 30 27 27 26 26   GLUCOSE  --    < > 115* 201* 287* 137* 159*  BUN  --    < > 63* 59* 50* 42* 38*  CREATININE  --    < > 2.30* 2.18* 2.05* 2.20* 2.05*  CALCIUM  --    < > 8.8* 8.4* 8.4* 8.6* 8.4*  MG 2.5*  --  2.2  --   --   --   --    < > = values in this interval not displayed.   GFR: Estimated Creatinine Clearance: 29.3 mL/min (A) (by C-G formula based on SCr of 2.05 mg/dL (H)). Liver Function Tests: Recent Labs  Lab 11/17/17 1831  AST 21  ALT 16*  ALKPHOS 70  BILITOT 0.8  PROT 7.5  ALBUMIN 3.8   Recent Labs  Lab 11/17/17 1831  LIPASE 30   No results for input(s): AMMONIA in the last 168 hours. Coagulation Profile: No results for input(s): INR, PROTIME in the last 168 hours. Cardiac Enzymes: Recent Labs  Lab 11/18/17 0310 11/18/17 0925 11/18/17 1651  TROPONINI 0.06* 0.06* 0.06*   BNP (last 3 results) No results for input(s): PROBNP in the last 8760 hours. HbA1C: No results for input(s): HGBA1C in the last 72 hours. CBG: Recent Labs  Lab 11/23/17 0735 11/23/17 1208 11/23/17 1736 11/23/17 2106 11/24/17 0802  GLUCAP 133* 300* 143* 149* 231*   Lipid Profile: No results for input(s): CHOL, HDL, LDLCALC, TRIG, CHOLHDL, LDLDIRECT in the last 72 hours. Thyroid Function Tests: No results for input(s): TSH, T4TOTAL, FREET4, T3FREE, THYROIDAB in the last 72 hours. Anemia Panel: No  results for input(s): VITAMINB12, FOLATE, FERRITIN, TIBC, IRON, RETICCTPCT in the last 72 hours. Sepsis Labs: No results for input(s): PROCALCITON, LATICACIDVEN in the last 168 hours.  Recent Results (from the past 240 hour(s))  MRSA PCR Screening     Status: None   Collection Time: 11/19/17  2:43 PM  Result Value Ref Range Status   MRSA by PCR NEGATIVE NEGATIVE Final    Comment:        The GeneXpert MRSA Assay (FDA approved for NASAL specimens only), is one component of a comprehensive MRSA colonization surveillance program. It is not intended to diagnose MRSA infection nor to guide or monitor treatment for MRSA infections. Performed at [facility], [address] [place], [place] [address]       Radiology Studies: No results found.  Scheduled Meds: . alfuzosin  10 mg Oral Daily  . apixaban  5 mg Oral BID  . atorvastatin  40 mg Oral QHS  . famotidine  20 mg Oral Daily  . furosemide  80 mg Intravenous BID  . gabapentin  300 mg Oral BID  . insulin aspart  0-20 Units Subcutaneous TID WC  . insulin glargine  40 Units Subcutaneous q morning - 10a  . polyethylene glycol  17 g Oral BID  . potassium chloride SA  40 mEq Oral Daily  . senna  1 tablet Oral Daily  . sodium chloride flush  3 mL Intravenous Q12H  . sodium chloride flush  3 mL Intravenous Q12H   Continuous Infusions: . sodium chloride    . sodium chloride    . sodium chloride    . amiodarone 30 mg/hr (11/24/17 0218)  . milrinone 0.25 mcg/kg/min (11/24/17 0605)     LOS: 6 days    Time spent: Total of 25 minutes spent with pt, greater than 50% of which was spent in discussion of  treatment, counseling and coordination of care    Latrelle Dodrill, MD Pager: Text Page via [email or web address]   If 7PM-7AM, please contact night-coverage [email or web address] 11/24/2017, 8:45 AM

## 2017-11-24 NOTE — Progress Notes (Signed)
CSW informed by spanish interpreter that patient relative (wife?) Stanley Farmer has been in room for multiple days and unable to get food consistently- only ate a tangerine yesterday.  Stanley Farmer unable to get back to their home in [place] to get food and does not have money to purchase food here.    CSW provided with 6 food vouchers so she can get food today and through the weekend  Please reconsult CSW next week if patient remains in hospital and Stanley Farmer still unable to access food.  Burna Sis, LCSW Clinical Social Worker [phone]

## 2017-11-24 NOTE — Progress Notes (Signed)
Advanced Heart Failure Rounding Note   Subjective:   Spanish speaking. Stratus used for interpretation. Denies SOB. 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 Wife says they never learned to read.   Yesterday he underwent TEE/DCCV with conversion to NSR. Remains on milrinone 0.25 mcg + amio 30 mg per hour.   CVP 7-8  Denies SOB. Feeling better. Co-ox 65%  Creatinine 2.0-> 2.2  Objective:   Weight Range:  Vital Signs:   Temp:  [98.1 F (36.7 C)-98.8 F (37.1 C)] 98.6 F (37 C) (02/07 0700) Pulse Rate:  [69-85] 77 (02/07 0800) Resp:  [16-32] 22 (02/07 0800) BP: (91-108)/(42-67) 108/67 (02/07 0800) SpO2:  [92 %-94 %] 92 % (02/07 0800) Weight:  [182 lb 4.8 oz (82.7 kg)] 182 lb 4.8 oz (82.7 kg) (02/07 0334) Last BM Date: 11/21/17  Weight change: Filed Weights   11/22/17 0640 11/23/17 0324 11/24/17 0334  Weight: 181 lb 4.8 oz (82.2 kg) 181 lb 9.6 oz (82.4 kg) 182 lb 4.8 oz (82.7 kg)    Intake/Output:   Intake/Output Summary (Last 24 hours) at 11/24/2017 1021 Last data filed at 11/24/2017 0800 Gross per 24 hour  Intake 444.1 ml  Output 1600 ml  Net -1155.9 ml     Physical Exam: CVP 7-8  General:  No resp difficulty Sitting in bed HEENT: normal Neck: supple. JVP 7-8  Carotids 2+ bilat; no bruits. No lymphadenopathy or thryomegaly appreciated. Cor: PMI laterally displaced. Regular rate & rhythm. +s3 Lungs: clear Abdomen: soft, nontender, nondistended. No hepatosplenomegaly. No bruits or masses. Good bowel sounds. Extremities: no cyanosis, clubbing, rash, edema. RUE PICC.  Warm  Neuro: alert & orientedx3, cranial nerves grossly intact. moves all 4 extremities w/o difficulty. Affect pleasant  Telemetry:  NSR 80-90s  Personally reviewed   Labs: Basic Metabolic Panel: Recent Labs  Lab 11/18/17 0310  11/20/17 0752 11/21/17 0615 11/22/17 0512 11/23/17 0330 11/24/17 0526  NA  --    < > 139 138 135 138 137  K  --    < > 3.2* 3.5 3.5 3.6 3.5  CL  --    < >  96* 98* 95* 100* 99*  CO2  --    < > 30 27 27 26 26   GLUCOSE  --    < > 115* 201* 287* 137* 159*  BUN  --    < > 63* 59* 50* 42* 38*  CREATININE  --    < > 2.30* 2.18* 2.05* 2.20* 2.05*  CALCIUM  --    < > 8.8* 8.4* 8.4* 8.6* 8.4*  MG 2.5*  --  2.2  --   --   --   --    < > = values in this interval not displayed.    Liver Function Tests: Recent Labs  Lab 11/17/17 1831  AST 21  ALT 16*  ALKPHOS 70  BILITOT 0.8  PROT 7.5  ALBUMIN 3.8   Recent Labs  Lab 11/17/17 1831  LIPASE 30   No results for input(s): AMMONIA in the last 168 hours.  CBC: Recent Labs  Lab 11/17/17 1831 11/18/17 0310 11/24/17 0526  WBC 7.1 6.3 6.1  NEUTROABS  --  3.6  --   HGB 13.7 12.3* 11.3*  HCT 40.3 37.9* 33.9*  MCV 89.4 90.5 89.2  PLT 279 271 203    Cardiac Enzymes: Recent Labs  Lab 11/18/17 0310 11/18/17 0925 11/18/17 1651  TROPONINI 0.06* 0.06* 0.06*    BNP: BNP (last 3  results) Recent Labs    11/17/17 1831  BNP 1,047.1*    ProBNP (last 3 results) No results for input(s): PROBNP in the last 8760 hours.    Other results:  Imaging: No results found.   Medications:     Scheduled Medications: . alfuzosin  10 mg Oral Daily  . apixaban  5 mg Oral BID  . atorvastatin  40 mg Oral QHS  . famotidine  20 mg Oral Daily  . furosemide  80 mg Intravenous BID  . gabapentin  300 mg Oral BID  . insulin aspart  0-20 Units Subcutaneous TID WC  . insulin glargine  40 Units Subcutaneous q morning - 10a  . polyethylene glycol  17 g Oral BID  . potassium chloride SA  40 mEq Oral Daily  . senna  1 tablet Oral Daily  . sodium chloride flush  3 mL Intravenous Q12H  . sodium chloride flush  3 mL Intravenous Q12H    Infusions: . sodium chloride    . sodium chloride    . sodium chloride    . amiodarone 30 mg/hr (11/24/17 0218)  . milrinone 0.25 mcg/kg/min (11/24/17 0605)    PRN Medications: sodium chloride, acetaminophen, bisacodyl, ondansetron (ZOFRAN) IV, sodium chloride  flush, sodium chloride flush, sodium chloride flush   Assessment:    Stanley Pinedais a 77 y.o.Spanish speakingmalewith medical history significantfor combined HF, NICM, EF 10-15%, with moderately reduced RV function on 01/16/16. PMH also includes CKD stage III, DM type II, hyperlipidemia, mild cognitive impairment, and remote alcohol tobacco abuse.      Plan/Discussion:     1. Acute on Chronic systolic HF -> cardiogenic shock,  - Unclear etiology. Long standing CMP, no h/o of cath, but Nuclear stress 2011 with fixed defect and no ischemia - Echo 07/2016 20%. Echo 11/18/17 shows EF 10%. With severe RV dysfunction  - PICC placed. Co-ox 2/2 33%. Started on milrinone overnight. Co-ox up to 51% now back down to 32% CO-OX stable 72% on milrinone 0.25 mcg. Cut down to 0.125 mcg. Anticipate stopping tomorrow.  CVP 12-13 in the chair. Start torsemide 40 mg twice a day.   - Suspect AF may have triggered low output. But nearing end-stage -- Hold coreg with decompensation.  -No ACE/ARB with AKI - Will need to discuss R/L Cath with him if renal function improver. - EP evaluated. CRT-D not thought to be beneficial.  - RV dysfunction and social situation likely precludes VAD  2. Atrial fibrillation - Unclear chronicity. Likely related to his decompensation.  - Most recent EKGs in chart in April/May 2017 show NSR.  - CHADSVaSc is 8. Started Eliquis 2/1 S/P TEE DC-CV on 2/5.  - Maintaining NSR.  EP appreciated.  - Plan to continue IV amio while on milrinone.   3. Elevated troponin - No ischemia  - 0.06. Flat trend. No ACS. Likely due to HF  4. Acute on CKD III - Creatinine ok 2.3>2.2  Creatinine 2.2 -->2.05  BMET in am.   5. H/o CVA - With cognitive deficits.   - Continue statin. Eliquis started.   6. DNR  7. Illiterate.  Unable to read spanish or english.   Consult [facility].   Will need HH. Refer today. Needs spanish speaking nurse.   Length of Stay: 6  Amy  Clegg NP-C  11/24/2017, 10:21 AM  Advanced Heart Failure Team Pager [phone] (M-F; 7a - 4p)  Please contact [facility] for night-coverage after hours (4p -7a ) and weekends on [email or web address]  Patient seen and examined with Stanley Becket, NP. We discussed all aspects of the encounter. I agree with the assessment and plan as stated above.   Underwent DC-CV yesterday. Remains in NSR on IV amio. On Eliquis for anticoagulation. Remains on milrinone for inotropic support. CVP and co-ox look good. Will wean milrinone to 0.125. Agree with starting po torsemide. Watch renal function closely.  [name], MD  5:24 PM

## 2017-11-24 NOTE — Progress Notes (Signed)
Physical Therapy Treatment Patient Details Name: Stanley Farmer MRN: [id number] DOB: 03-25-1941 Today's Date: 11/24/2017    History of Present Illness Pt is a 77 y.o. Spanish-speaking male admitted 11/17/17 with abdominal pain, swelling, and SOB; worked up for CHF exacerbation. S/p cardioversion and TEE 2/5. PMH includes CHF, CKD III, DM, HLD, CVA (residual cognitive deficits per chart); son reports pt is has poor vision (blind R eye?) and very HOH.   PT Comments    Pt slowly progressing with mobility. In-person Spanish interpreter Stanley Farmer) utilized this session, which pt and wife responded very well too. Pt able to transfer and amb short distance with RW and min guard for balance; limited by fatigue, poor vision, and bilat knee pain, which is pt's baseline. Will have necessary assist from family upon d/c home. Pt would benefit from RW and wheelchair to maximize safe mobility and functional independence at home. Continue to recommend HHPT services. Will follow acutely.   Follow Up Recommendations  Home health PT;Supervision/Assistance - 24 hour     Equipment Recommendations  Rolling walker with 5" wheels;Wheelchair (measurements PT);Wheelchair cushion (measurements PT)    Recommendations for Other Services       Precautions / Restrictions Precautions Precautions: Fall;Other (comment) Precaution Comments: Poor vision, HOH Restrictions Weight Bearing Restrictions: No    Mobility  Bed Mobility Overal bed mobility: Needs Assistance Bed Mobility: Supine to Sit     Supine to sit: Modified independent (Device/Increase time)     General bed mobility comments: Increased time and effort; HOB flat. No physical assist required  Transfers Overall transfer level: Needs assistance Equipment used: Rolling walker (2 wheeled) Transfers: Sit to/from Stand Sit to Stand: Min guard         General transfer comment: Able to stand on second attempt with RW and min guard for balance. Pt c/o  chronic bilat knee pains that limit him  Ambulation/Gait Ambulation/Gait assistance: Min guard Ambulation Distance (Feet): 15 Feet Assistive device: Rolling walker (2 wheeled) Gait Pattern/deviations: Step-to pattern;Trunk flexed;Antalgic;Shuffle;Decreased stride length Gait velocity: Decreased Gait velocity interpretation: <1.8 ft/sec, indicative of risk for recurrent falls General Gait Details: Slow, slightly unsteady amb with RW and min guard for balance. Pt very forward flexed with shuffling pace (reports chronic, secondary to bilat knee and back pain)   Stairs            Wheelchair Mobility    Modified Rankin (Stroke Patients Only)       Balance Overall balance assessment: Needs assistance   Sitting balance-Leahy Scale: Good       Standing balance-Leahy Scale: Poor Standing balance comment: Reliant on UE support                            Cognition Arousal/Alertness: Awake/alert Behavior During Therapy: WFL for tasks assessed/performed Overall Cognitive Status: Difficult to assess                                 General Comments: Pt responded well to Graciela's presence and conversation today, much more interactive; following all commands appropraitely. Cues for safe amb with RW, most likely needed due to poor vision. (pt does not hear or see well)      Exercises      General Comments General comments (skin integrity, edema, etc.): Spanish interpreter utilized Stanley Farmer); wife present throughout session      Pertinent Vitals/Pain Pain Assessment: Faces Faces  Pain Scale: Hurts even more Pain Location: B knees Pain Descriptors / Indicators: Sore;Constant Pain Intervention(s): Monitored during session;Limited activity within patient's tolerance    Home Living                      Prior Function            PT Goals (current goals can now be found in the care plan section) Acute Rehab PT Goals Patient Stated Goal:  Return home PT Goal Formulation: With patient Time For Goal Achievement: 12/03/17 Potential to Achieve Goals: Good Progress towards PT goals: Progressing toward goals    Frequency    Min 3X/week      PT Plan Equipment recommendations need to be updated    Co-evaluation              AM-PAC PT "6 Clicks" Daily Activity  Outcome Measure  Difficulty turning over in bed (including adjusting bedclothes, sheets and blankets)?: None Difficulty moving from lying on back to sitting on the side of the bed? : None Difficulty sitting down on and standing up from a chair with arms (e.g., wheelchair, bedside commode, etc,.)?: A Little Help needed moving to and from a bed to chair (including a wheelchair)?: A Little Help needed walking in hospital room?: A Little Help needed climbing 3-5 steps with a railing? : A Lot 6 Click Score: 19    End of Session Equipment Utilized During Treatment: Gait belt Activity Tolerance: Patient tolerated treatment well;Patient limited by fatigue Patient left: in chair;with call bell/phone within reach;with family/visitor present Nurse Communication: Mobility status PT Visit Diagnosis: Other abnormalities of gait and mobility (R26.89)     Time: 4098-1191 PT Time Calculation (min) (ACUTE ONLY): 29 min  Charges:  $Gait Training: 8-22 mins $Therapeutic Activity: 8-22 mins                    G Codes:      Ina Homes, PT, DPT Acute Rehab Services  Pager: [phone]  Malachy Chamber 11/24/2017, 10:27 AM

## 2017-11-25 LAB — GLUCOSE, CAPILLARY
Glucose-Capillary: 105 mg/dL — ABNORMAL HIGH (ref 65–99)
Glucose-Capillary: 230 mg/dL — ABNORMAL HIGH (ref 65–99)
Glucose-Capillary: 232 mg/dL — ABNORMAL HIGH (ref 65–99)
Glucose-Capillary: 168 mg/dL — ABNORMAL HIGH (ref 65–99)

## 2017-11-25 LAB — BASIC METABOLIC PANEL
Anion gap: 11 (ref 5–15)
BUN: 34 mg/dL — AB (ref 6–20)
CHLORIDE: 101 mmol/L (ref 101–111)
CO2: 27 mmol/L (ref 22–32)
CREATININE: 1.95 mg/dL — AB (ref 0.61–1.24)
Calcium: 8.6 mg/dL — ABNORMAL LOW (ref 8.9–10.3)
GFR calc Af Amer: 36 mL/min — ABNORMAL LOW (ref >60)
GFR calc non Af Amer: 31 mL/min — ABNORMAL LOW (ref >60)
Glucose, Bld: 136 mg/dL — ABNORMAL HIGH (ref 65–99)
Potassium: 3.6 mmol/L (ref 3.5–5.1)
Sodium: 139 mmol/L (ref 135–145)

## 2017-11-25 LAB — COOXEMETRY PANEL
Carboxyhemoglobin: 1.5 % (ref 0.5–1.5)
Methemoglobin: 1.1 % (ref 0.0–1.5)
O2 SAT: 72.5 %
TOTAL HEMOGLOBIN: 11 g/dL — AB (ref 12.0–16.0)

## 2017-11-25 MED ORDER — AMIODARONE HCL 200 MG PO TABS
200.0000 mg | ORAL_TABLET | Freq: Two times a day (BID) | ORAL | Status: DC
  Administered 2017-11-25 – 2017-11-27 (×5): 200 mg via ORAL
  Filled 2017-11-25 (×5): qty 1

## 2017-11-25 MED ORDER — FUROSEMIDE 10 MG/ML IJ SOLN
80.0000 mg | Freq: Two times a day (BID) | INTRAMUSCULAR | Status: DC
  Administered 2017-11-25 – 2017-11-27 (×5): 80 mg via INTRAVENOUS
  Filled 2017-11-25 (×5): qty 8

## 2017-11-25 MED ORDER — SORBITOL 70 % SOLN
30.0000 mL | Freq: Once | Status: AC
  Administered 2017-11-25: 30 mL via ORAL
  Filled 2017-11-25: qty 30

## 2017-11-25 NOTE — Progress Notes (Signed)
Advanced Heart Failure Rounding Note   Subjective:   Spanish speaking. Stratus used for interpretation. Denies SOB. 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 Wife says they never learned to read.   S/P TEE/DC-CV 2/6--> NSR   Maintaining NSR. CO-OX 72%. Yesterday milrinone was cut back to 0.17mcg and torsemide was started.   Denies SOB.   Objective:   Weight Range:  Vital Signs:   Temp:  [97.6 F (36.4 C)-98.9 F (37.2 C)] 98.7 F (37.1 C) (02/08 0700) Pulse Rate:  [75-78] 77 (02/08 0700) Resp:  [16-24] 18 (02/08 0700) BP: (97-111)/(59-72) 108/67 (02/08 0700) SpO2:  [94 %-97 %] 95 % (02/08 0700) Last BM Date: 11/21/17  Weight change: Filed Weights   11/22/17 0640 11/23/17 0324 11/24/17 0334  Weight: 181 lb 4.8 oz (82.2 kg) 181 lb 9.6 oz (82.4 kg) 182 lb 4.8 oz (82.7 kg)    Intake/Output:   Intake/Output Summary (Last 24 hours) at 11/25/2017 0949 Last data filed at 11/25/2017 0800 Gross per 24 hour  Intake 1123.56 ml  Output 1000 ml  Net 123.56 ml     Physical Exam: CVP 15 General:  In bed. No resp difficulty HEENT: normal Neck: supple. JVP to jaw  Carotids 2+ bilat; no bruits. No lymphadenopathy or thryomegaly appreciated. Cor: PMI nondisplaced. Regular rate & rhythm. No rubs, gallops or murmurs. Lungs: clear Abdomen: soft, nontender, distended. No hepatosplenomegaly. No bruits or masses. Good bowel sounds. Extremities: no cyanosis, clubbing, rash, edema. RUE PICC  Neuro: alert & orientedx3, cranial nerves grossly intact. moves all 4 extremities w/o difficulty. Affect pleasant  Telemetry:  NSR 60-70s personally reviewed.   Labs: Basic Metabolic Panel: Recent Labs  Lab 11/20/17 0752 11/21/17 0615 11/22/17 0512 11/23/17 0330 11/24/17 0526 11/25/17 0500  NA 139 138 135 138 137 139  K 3.2* 3.5 3.5 3.6 3.5 3.6  CL 96* 98* 95* 100* 99* 101  CO2 30 27 27 26 26 27   GLUCOSE 115* 201* 287* 137* 159* 136*  BUN 63* 59* 50* 42* 38* 34*  CREATININE  2.30* 2.18* 2.05* 2.20* 2.05* 1.95*  CALCIUM 8.8* 8.4* 8.4* 8.6* 8.4* 8.6*  MG 2.2  --   --   --   --   --     Liver Function Tests: No results for input(s): AST, ALT, ALKPHOS, BILITOT, PROT, ALBUMIN in the last 168 hours. No results for input(s): LIPASE, AMYLASE in the last 168 hours. No results for input(s): AMMONIA in the last 168 hours.  CBC: Recent Labs  Lab 11/24/17 0526  WBC 6.1  HGB 11.3*  HCT 33.9*  MCV 89.2  PLT 203    Cardiac Enzymes: Recent Labs  Lab 11/18/17 1651  TROPONINI 0.06*    BNP: BNP (last 3 results) Recent Labs    11/17/17 1831  BNP 1,047.1*    ProBNP (last 3 results) No results for input(s): PROBNP in the last 8760 hours.    Other results:  Imaging: No results found.   Medications:     Scheduled Medications: . alfuzosin  10 mg Oral Daily  . amiodarone  200 mg Oral BID  . apixaban  5 mg Oral BID  . atorvastatin  40 mg Oral QHS  . famotidine  20 mg Oral Daily  . gabapentin  300 mg Oral BID  . insulin aspart  0-20 Units Subcutaneous TID WC  . insulin glargine  50 Units Subcutaneous q morning - 10a  . polyethylene glycol  17 g Oral BID  .  potassium chloride SA  40 mEq Oral Daily  . senna  1 tablet Oral Daily  . sodium chloride flush  3 mL Intravenous Q12H  . sodium chloride flush  3 mL Intravenous Q12H  . torsemide  40 mg Oral BID    Infusions: . sodium chloride    . sodium chloride    . 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 PMH also includes CKD stage III, DM type II, hyperlipidemia, mild cognitive impairment, and remote alcohol tobacco abuse.      Plan/Discussion:     1. Acute on Chronic systolic  HF -> cardiogenic shock,  - Unclear etiology. Long standing CMP, no h/o of cath, but Nuclear stress 2011 with fixed defect and no ischemia - Echo 07/2016 20%. Echo 11/18/17 shows EF 10%. With severe RV dysfunction  - PICC placed. Co-ox 2/2 33% placed on milrinone.  Milrinone wean started.Stable on milrinone 0.125 mcg.  Todays CO-OX is 72.5 %. Stop milrinone.  Volume status trending up. Stop torsemide and start lasix 80 mg twice daily.    - Suspect AF may have triggered low output. But nearing end-stage -- Hold coreg with decompensation.  -No ACE/ARB with AKI - Will need to discuss R/L Cath with him if renal function improver. - EP evaluated. CRT-D not thought to be beneficial.  - RV dysfunction and social situation likely precludes VAD  2. Atrial fibrillation - Unclear chronicity. Likely related to his decompensation.  - Most recent EKGs in chart in April/May 2017 show NSR.  - CHADSVaSc is 8. Started Eliquis 2/1 S/P TEE DC-CV on 2/5.  Stop IV amio and start amio 200 mg twice a day.  EP appreciated.   3. Elevated troponin - No ischemia  - 0.06. Flat trend. No ACS. Likely due to HF  4. Acute on CKD III - Creatinine ok 2.3>2.2  Creatinine 2.2 -->2.05 ->1.95   5. H/o CVA - With cognitive deficits.   - Continue statin. Eliquis started.   6. DNR  7. Illiterate.  Unable to read spanish or english.    Will need HH. Refer today. Needs spanish speaking nurse.   Length of Stay: 7  Amy Clegg NP-C  11/25/2017, 9:49 AM  Advanced Heart Failure Team Pager [phone] (M-F; 7a - 4p)  Please contact [facility] for night-coverage after hours (4p -7a ) and weekends on [email or web address]  Patient seen and examined with [name], NP. We discussed all aspects of the encounter. I agree with the assessment and plan as stated above.   He is maintaining NSR on amio. Co-ox ok today so can stop milrinone. Will switch amio to po. On exam volume status does not look so bad but CVP (measured personally  remains 15-16) so will start IV lasix. Continue Eliquis. I spoke with Dr. Sharion Dove personally and we discussed that he may be stable for d/c tomorrow if volume status and co-ox ok. Will likely need torsemide 80 bid which was previous home dose.   [name], MD  5:56 PM

## 2017-11-25 NOTE — Care Management Note (Addendum)
Case Management Note  Patient Details  Name: Stanley Farmer MRN: [id number] Date of Birth: January 09, 1941  Subjective/Objective:   Pt admitted with cardiogenic shock                  Action/Plan:  PTA independent from home with wife.  Pt will go home on Eliquis - CM submitted benefit check and provided pt free 30 day card.  Wife states she will get prescription filled at [facility] on [facility].  Pt informed he needs to weigh daily and adhere to low salt diet.  CM discussed above with pt and wife via interpretor.     Expected Discharge Date:                  Expected Discharge Plan:  Home/Self Care  In-House Referral:     Discharge planning Services  CM Consult  Post Acute Care Choice:    Choice offered to:     DME Arranged:    DME Agency:     HH Arranged:    HH Agency:     Status of Service:  In process, will continue to follow  If discussed at Long Length of Stay Meetings, dates discussed:    Additional Comments: 11/25/2017 CM requested HH orders including RN for medication management and DME orders via text and also on physician sticky tab.    Eliquis Benefit Check # 2 Deniece Portela  @ [employer] # [phone]    1.ELIQUIS 2.50 MG BID  COVER- YES  CO-PAY- $ 3.80   TIER- 3 DRUG  PRIOR APPROVAL- NO    2. ELIQUIS 5 MG BID  COVER- YES  CO-PAY- $ 3.80   TIER- 3 DRUG  PRIOR APPROVAL- NO  Cherylann Parr, RN 11/25/2017, 8:40 AM

## 2017-11-25 NOTE — Progress Notes (Signed)
Physical Therapy Treatment Patient Details Name: Stanley Farmer MRN: [id number] DOB: 07-10-1941 Today's Date: 11/25/2017    History of Present Illness Pt is a 77 y.o. Spanish-speaking male admitted 11/17/17 with abdominal pain, swelling, and SOB; worked up for CHF exacerbation. S/p cardioversion and TEE 2/5. PMH includes CHF, CKD III, DM, HLD, CVA (residual cognitive deficits per chart); son reports pt is has poor vision (blind R eye?) and very HOH.   PT Comments    Pt progressing well with mobility; remains limited by c/o chronic bilat knee pain and fatigue. Able to amb 84' with RW, progressing to supervision for safety due to poor vision. Pt would benefit from wheelchair due to limited amb distance, as well a 3-in-1 BSC for limited mobility. Continue to recommend HHPT services to maximize functional mobility and independence. Will follow acutely.    Follow Up Recommendations  Home health PT;Supervision/Assistance - 24 hour     Equipment Recommendations  Wheelchair (measurements PT);Wheelchair cushion (measurements PT); 3-in-1    Recommendations for Other Services       Precautions / Restrictions Precautions Precautions: Fall;Other (comment) Precaution Comments: Poor vision, HOH Restrictions Weight Bearing Restrictions: No    Mobility  Bed Mobility               General bed mobility comments: Received sitting EOB  Transfers Overall transfer level: Needs assistance Equipment used: Rolling walker (2 wheeled) Transfers: Sit to/from Stand Sit to Stand: Supervision         General transfer comment: Supervision for safety; assist for lines. Pt c/o chronic bilat knee pain  Ambulation/Gait Ambulation/Gait assistance: Supervision Ambulation Distance (Feet): 50 Feet Assistive device: Rolling walker (2 wheeled) Gait Pattern/deviations: Step-to pattern;Trunk flexed;Antalgic;Shuffle;Decreased stride length Gait velocity: Decreased Gait velocity interpretation: <1.8 ft/sec,  indicative of risk for recurrent falls General Gait Details: Slow, controlled amb with RW, progressing to supervision for safety. Cues for safety and direction secondary to poor vision. Cues for upright posture as pt very forward flexed   Stairs            Wheelchair Mobility    Modified Rankin (Stroke Patients Only)       Balance Overall balance assessment: Needs assistance   Sitting balance-Leahy Scale: Good       Standing balance-Leahy Scale: Poor Standing balance comment: Reliant on UE support                            Cognition Arousal/Alertness: Awake/alert Behavior During Therapy: WFL for tasks assessed/performed Overall Cognitive Status: Difficult to assess                                 General Comments: At times, slow to respond, but interacting appropriately throughout session via Spanish interpreter Ashby Dawes). Poor safety awareness with ambulation      Exercises      General Comments General comments (skin integrity, edema, etc.): Spanish interpreter utilized Ashby Dawes); wife and son present throughout session      Pertinent Vitals/Pain Pain Assessment: Faces Faces Pain Scale: Hurts little more Pain Location: B knees Pain Descriptors / Indicators: Sore;Constant Pain Intervention(s): Monitored during session;Limited activity within patient's tolerance    Home Living                      Prior Function            PT Goals (current  goals can now be found in the care plan section) Acute Rehab PT Goals Patient Stated Goal: Return home PT Goal Formulation: With patient Time For Goal Achievement: 12/03/17 Potential to Achieve Goals: Good Progress towards PT goals: Progressing toward goals    Frequency    Min 3X/week      PT Plan Equipment recommendations need to be updated    Co-evaluation              AM-PAC PT "6 Clicks" Daily Activity  Outcome Measure  Difficulty turning over in bed  (including adjusting bedclothes, sheets and blankets)?: None Difficulty moving from lying on back to sitting on the side of the bed? : None Difficulty sitting down on and standing up from a chair with arms (e.g., wheelchair, bedside commode, etc,.)?: A Little Help needed moving to and from a bed to chair (including a wheelchair)?: A Little Help needed walking in hospital room?: A Little Help needed climbing 3-5 steps with a railing? : A Lot 6 Click Score: 19    End of Session Equipment Utilized During Treatment: Gait belt Activity Tolerance: Patient tolerated treatment well;Patient limited by pain Patient left: in bed;with call bell/phone within reach;with family/visitor present Nurse Communication: Mobility status PT Visit Diagnosis: Other abnormalities of gait and mobility (R26.89)     Time: 1350-1403 PT Time Calculation (min) (ACUTE ONLY): 13 min  Charges:  $Gait Training: 8-22 mins                    G Codes:      Ina Homes, PT, DPT Acute Rehab Services  Pager: [phone]  Malachy Chamber 11/25/2017, 2:22 PM

## 2017-11-25 NOTE — Evaluation (Signed)
Occupational Therapy Evaluation and Discharge  Patient Details Name: Stanley Farmer MRN: [id number] DOB: August 17, 1941 Today's Date: 11/25/2017    History of Present Illness Pt is a 77 y.o. Spanish-speaking male admitted 11/17/17 with abdominal pain, swelling, and SOB; worked up for CHF exacerbation. S/p cardioversion and TEE 2/5. PMH includes CHF, CKD III, DM, HLD, CVA (residual cognitive deficits per chart); son reports pt is has poor vision (blind R eye?) and very HOH.   Clinical Impression   PTA Pt Mod I with RW for mobility. Pt got help into the tub but was able to wash up by himself, he required assist for LB dressing PRN for chronic knee pain but otherwise is mod I. Pt presents close to baseline and able to perform all ADL tasks today with increased time and effort. Transfers at supervision level with cues for safety due to baseline visual deficits. Pt will require a 3 in 1 for safety in home environment - Pt educated in full about uses of this equipment. OT to sign off at this point. Thank you for the opportunity to serve this patient and his family    Follow Up Recommendations  Supervision/Assistance - 24 hour    Equipment Recommendations  3 in 1 bedside commode    Recommendations for Other Services       Precautions / Restrictions Precautions Precautions: Fall;Other (comment) Precaution Comments: Poor vision, HOH Restrictions Weight Bearing Restrictions: No      Mobility Bed Mobility Overal bed mobility: Needs Assistance Bed Mobility: Supine to Sit     Supine to sit: Modified independent (Device/Increase time)     General bed mobility comments: Received sitting EOB  Transfers Overall transfer level: Needs assistance Equipment used: Rolling walker (2 wheeled) Transfers: Sit to/from Stand Sit to Stand: Supervision         General transfer comment: Supervision for safety; assist for lines. Pt c/o chronic bilat knee pain    Balance Overall balance assessment:  Needs assistance Sitting-balance support: No upper extremity supported;Feet supported Sitting balance-Leahy Scale: Good Sitting balance - Comments: able to perform LB dressing sitting EOB     Standing balance-Leahy Scale: Poor Standing balance comment: Reliant on UE support                           ADL either performed or assessed with clinical judgement   ADL Overall ADL's : At baseline                                       General ADL Comments: Pt able to don/doff socks with increased time sitting EOB. Pt typically is able to get in the tub with assist and then wash himself. Pt presents with decreased activity tolerance, but otherwise at baseline per Pt and confirmed by son and wife. At this time they do not have any questions or concerns for ADL tasks in the home environment. Pt and family educated in 3 in 1 and uses as BSC, toilet riser and shower chair.      Vision Baseline Vision/History: (Deficits from prior CVA) Patient Visual Report: No change from baseline Additional Comments: Educated on increasing contrast around the home and other safety tips - handout not provided as it is in english and this family speaks spanish only     Perception     Praxis      Pertinent Vitals/Pain  Pain Assessment: Faces Faces Pain Scale: Hurts little more Pain Location: B knees Pain Descriptors / Indicators: Sore;Constant Pain Intervention(s): Monitored during session     Hand Dominance     Extremity/Trunk Assessment Upper Extremity Assessment Upper Extremity Assessment: Generalized weakness   Lower Extremity Assessment Lower Extremity Assessment: Defer to PT evaluation   Cervical / Trunk Assessment Cervical / Trunk Assessment: Kyphotic   Communication Communication Communication: Interpreter utilized   Cognition Arousal/Alertness: Awake/alert Behavior During Therapy: WFL for tasks assessed/performed Overall Cognitive Status: Difficult to assess                                  General Comments: At times, slow to respond, but interacting appropriately throughout session via Spanish interpreter Ashby Dawes). Poor safety awareness with ambulation   General Comments  Spanish interpreter utilized Ashby Dawes); wife and son present throughout session    Exercises     Shoulder Instructions      Home Living Family/patient expects to be discharged to:: Private residence Living Arrangements: Spouse/significant other Available Help at Discharge: Family;Available 24 hours/day Type of Home: Mobile home Home Access: Stairs to enter Entrance Stairs-Number of Steps: 4 Entrance Stairs-Rails: Can reach both Home Layout: One level     Bathroom Shower/Tub: Chief Strategy Officer: Standard     Home Equipment: Environmental consultant - 2 wheels;Cane - single point          Prior Functioning/Environment Level of Independence: Independent with assistive device(s)        Comments: Pt mod indep with RW        OT Problem List: Decreased strength;Decreased activity tolerance;Impaired balance (sitting and/or standing);Decreased knowledge of use of DME or AE      OT Treatment/Interventions:      OT Goals(Current goals can be found in the care plan section) Acute Rehab OT Goals Patient Stated Goal: Return home OT Goal Formulation: With patient/family Time For Goal Achievement: 12/09/17 Potential to Achieve Goals: Good  OT Frequency:     Barriers to D/C:            Co-evaluation              AM-PAC PT "6 Clicks" Daily Activity     Outcome Measure Help from another person eating meals?: None Help from another person taking care of personal grooming?: A Little Help from another person toileting, which includes using toliet, bedpan, or urinal?: A Little Help from another person bathing (including washing, rinsing, drying)?: A Little Help from another person to put on and taking off regular upper body clothing?: A  Little Help from another person to put on and taking off regular lower body clothing?: None 6 Click Score: 20   End of Session Equipment Utilized During Treatment: Gait belt;Rolling walker Nurse Communication: Mobility status  Activity Tolerance: Patient tolerated treatment well Patient left: in bed;with call bell/phone within reach;with family/visitor present(sitting EOB)  OT Visit Diagnosis: Unsteadiness on feet (R26.81);Other abnormalities of gait and mobility (R26.89);Muscle weakness (generalized) (M62.81)                Time: 4098-1191 OT Time Calculation (min): 13 min Charges:  OT General Charges $OT Visit: 1 Visit OT Evaluation $OT Eval Moderate Complexity: 1 Mod G-Codes:     Sherryl Manges OTR/L [phone]  Evern Bio Jamesia Linnen 11/25/2017, 3:01 PM

## 2017-11-25 NOTE — Progress Notes (Signed)
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 BNP 1047, hypoxic with O2 sats 82% with ambulation. Creatinine 1.9. Patient was admitted for further workup for CHF exacerbation.  Patient admitted with cardiogenic shock, new A. fib. He was started on milrinone, IV lasix. He also has acute on chronic renal failure. Cr and renal function stable. He underwent cardioversion 2-05, with hope this will improved Heart Function.   Subjective: Seen and examined, he continues to improve, report his breathing is back to his baseline.  Denies chest pain, palpitations shortness of breath.  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 Overload on my exam seems to be compensated  -Continue treatment per cardiology - dispo when HF team clears   Atrial fibrillation- new onset; post cardioversion 2-05 -New onset A fib.  -Contributing to CHF -Patient on Eliquis -Patient on Amio 200 mg BID  -Maintaining NSR status post TEE DCCV  2/5  Acute kidney injury on CKD (chronic kidney disease) stage III -Baseline creatinine 1.2-1.9  -Cardiorenal syndrome -Remain with good urine output -Creatinine  continues to improves  -Continue to monitor renal function  Essential hypertension -BP stable Coreg on hold will defer to cardiology for when to begin -No ACE at this time given renal insufficiency  DM type 2 with diabetic peripheral neuropathy (HCC) -CBG elevated, patient on half dose from home insulin due to hypoglycemic episode on admission -Increase Lantus to 50 units -Continue SSI -Hemoglobin A1c 6.5  BPH (benign prostatic hyperplasia) -Continue alfuzosin  CAD -Continue Plavix, statin  Hyperlipidemia -Continue atorvastatin  Constipation; continue with Miralax. Add senokot. No significant BM yet,   DVT prophylaxis: Eliquis Code Status: DNR Family Communication: Wife at bedside Disposition Plan: Home when cleared by cardiology  Consultants:   Cardiology CHF team  Procedures:   Echo, TEE DCCV  Antimicrobials:  None   Objective: Vitals:   11/24/17 2033 11/25/17 0020 11/25/17 0700 11/25/17 1100  BP: 108/66 (!) 97/59 108/67 120/74  Pulse:  78 77 78  Resp:  16 18 (!) 25  Temp: 98.9 F (37.2 C) 98.7 F (37.1 C) 98.7 F (37.1 C) 98.7 F (37.1 C)  TempSrc: Oral Oral Oral Oral  SpO2:  94% 95% 100%  Weight:      Height:        Intake/Output Summary (Last 24 hours) at 11/25/2017 1552 Last data filed at 11/25/2017 1100 Gross per 24 hour  Intake 883.56 ml  Output 1200 ml  Net -316.44 ml   Filed Weights   11/22/17 0640 11/23/17 0324 11/24/17 0334  Weight: 82.2 kg (181 lb 4.8 oz) 82.4 kg (181 lb 9.6 oz) 82.7 kg (182 lb 4.8 oz)    Examination:  General exam: NAD  HEENT: OP moist and clear Respiratory  system: BS slight diminished, mild crackles  Cardiovascular system: S1S2 RRR Mild JVD, no murmurs  Gastrointestinal system: Soft NTND  Central nervous system: AAOx3  Extremities: No LE edema   Data Reviewed: I have personally reviewed following labs and imaging studies  CBC: Recent Labs  Lab 11/24/17 0526  WBC 6.1  HGB 11.3*  HCT 33.9*  MCV  89.2  PLT 203   Basic Metabolic Panel: Recent Labs  Lab 11/20/17 0752 11/21/17 0615 11/22/17 0512 11/23/17 0330 11/24/17 0526 11/25/17 0500  NA 139 138 135 138 137 139  K 3.2* 3.5 3.5 3.6 3.5 3.6  CL 96* 98* 95* 100* 99* 101  CO2 30 27 27 26 26 27   GLUCOSE 115* 201* 287* 137* 159* 136*  BUN 63* 59* 50* 42* 38* 34*  CREATININE 2.30* 2.18* 2.05* 2.20* 2.05* 1.95*  CALCIUM 8.8* 8.4* 8.4* 8.6* 8.4* 8.6*  MG 2.2  --   --   --   --   --    GFR: Estimated Creatinine Clearance: 30.8 mL/min (A) (by C-G formula based on SCr of 1.95 mg/dL (H)). Liver Function Tests: No results for input(s): AST, ALT, ALKPHOS, BILITOT, PROT, ALBUMIN in the last 168 hours. No results for input(s): LIPASE, AMYLASE in the last 168 hours. No results for input(s): AMMONIA in the last 168 hours. Coagulation Profile: No results for input(s): INR, PROTIME in the last 168 hours. Cardiac Enzymes: Recent Labs  Lab 11/18/17 1651  TROPONINI 0.06*   BNP (last 3 results) No results for input(s): PROBNP in the last 8760 hours. HbA1C: No results for input(s): HGBA1C in the last 72 hours. CBG: Recent Labs  Lab 11/24/17 1209 11/24/17 1709 11/24/17 2140 11/25/17 0825 11/25/17 1236  GLUCAP 247* 221* 95 105* 230*   Lipid Profile: No results for input(s): CHOL, HDL, LDLCALC, TRIG, CHOLHDL, LDLDIRECT in the last 72 hours. Thyroid Function Tests: No results for input(s): TSH, T4TOTAL, FREET4, T3FREE, THYROIDAB in the last 72 hours. Anemia Panel: No results for input(s): VITAMINB12, FOLATE, FERRITIN, TIBC, IRON, RETICCTPCT in the last 72 hours. Sepsis Labs: No results for input(s): PROCALCITON, LATICACIDVEN in the last 168 hours.  Recent Results (from the past 240 hour(s))  MRSA PCR Screening     Status: None   Collection Time: 11/19/17  2:43 PM  Result Value Ref Range Status   MRSA by PCR NEGATIVE NEGATIVE Final    Comment:        The GeneXpert MRSA Assay (FDA approved for NASAL specimens only), is one  component of a comprehensive MRSA colonization surveillance program. It is not intended to diagnose MRSA infection nor to guide or monitor treatment for MRSA infections. Performed at [facility], [address], [place], [place] [address]       Radiology Studies: No results found.    Scheduled Meds: . alfuzosin  10 mg Oral Daily  . amiodarone  200 mg Oral BID  . apixaban  5 mg Oral BID  . atorvastatin  40 mg Oral QHS  . famotidine  20 mg Oral Daily  . furosemide  80 mg Intravenous BID  . gabapentin  300 mg Oral BID  . insulin aspart  0-20 Units Subcutaneous TID WC  . insulin glargine  50 Units Subcutaneous q morning - 10a  . polyethylene glycol  17 g Oral BID  . potassium chloride SA  40 mEq Oral Daily  . senna  1 tablet Oral Daily  . sodium chloride flush  3 mL Intravenous Q12H  .  sodium chloride flush  3 mL Intravenous Q12H   Continuous Infusions: . sodium chloride    . sodium chloride    . sodium chloride       LOS: 7 days    Time spent: Total of 15 minutes spent with pt, greater than 50% of which was spent in discussion of  treatment, counseling and coordination of care   Latrelle Dodrill, MD Pager: Text Page via [email or web address]   If 7PM-7AM, please contact night-coverage [email or web address] 11/25/2017, 3:52 PM

## 2017-11-26 DIAGNOSIS — N183 Chronic kidney disease, stage 3 (moderate): Secondary | ICD-10-CM

## 2017-11-26 LAB — BASIC METABOLIC PANEL
ANION GAP: 11 (ref 5–15)
BUN: 34 mg/dL — ABNORMAL HIGH (ref 6–20)
CALCIUM: 8.6 mg/dL — AB (ref 8.9–10.3)
CO2: 27 mmol/L (ref 22–32)
Chloride: 101 mmol/L (ref 101–111)
Creatinine, Ser: 2.04 mg/dL — ABNORMAL HIGH (ref 0.61–1.24)
GFR calc non Af Amer: 30 mL/min — ABNORMAL LOW (ref >60)
GFR, EST AFRICAN AMERICAN: 34 mL/min — AB (ref >60)
GLUCOSE: 126 mg/dL — AB (ref 65–99)
POTASSIUM: 4.1 mmol/L (ref 3.5–5.1)
SODIUM: 139 mmol/L (ref 135–145)

## 2017-11-26 LAB — GLUCOSE, CAPILLARY
Glucose-Capillary: 107 mg/dL — ABNORMAL HIGH (ref 65–99)
Glucose-Capillary: 201 mg/dL — ABNORMAL HIGH (ref 65–99)
Glucose-Capillary: 147 mg/dL — ABNORMAL HIGH (ref 65–99)
Glucose-Capillary: 127 mg/dL — ABNORMAL HIGH (ref 65–99)

## 2017-11-26 LAB — COOXEMETRY PANEL
CARBOXYHEMOGLOBIN: 1.4 % (ref 0.5–1.5)
Carboxyhemoglobin: 1.5 % (ref 0.5–1.5)
METHEMOGLOBIN: 1.1 % (ref 0.0–1.5)
Methemoglobin: 1.1 % (ref 0.0–1.5)
O2 Saturation: 52.1 %
O2 Saturation: 61.8 %
Total hemoglobin: 12.4 g/dL (ref 12.0–16.0)
Total hemoglobin: 11.6 g/dL — ABNORMAL LOW (ref 12.0–16.0)

## 2017-11-26 MED ORDER — DIGOXIN 125 MCG PO TABS
0.1250 mg | ORAL_TABLET | Freq: Every day | ORAL | Status: DC
  Administered 2017-11-26 – 2017-11-27 (×2): 0.125 mg via ORAL
  Filled 2017-11-26 (×2): qty 1

## 2017-11-26 MED ORDER — METOLAZONE 2.5 MG PO TABS
2.5000 mg | ORAL_TABLET | Freq: Once | ORAL | Status: AC
  Administered 2017-11-26: 2.5 mg via ORAL
  Filled 2017-11-26: qty 1

## 2017-11-26 NOTE — Progress Notes (Signed)
PROGRESS NOTE [facility]   [place]   YKZ:[id number] DOB: 1941-05-24  DOA: 11/17/2017 PCP: [name], MD   Brief Narrative:  Stanley Farmer is a 77 year old Spanish-speaking male with a systolic CHF, EF 93% dilated cardiomyopathy, CKD stage III, DM type II, hyperlipidemia, and remote  tobacco abuse presented with 3 days complaint of abdominal swelling, shortness of breath with exertion. BNP 1047, hypoxic with O2 sats 82% with ambulation. Creatinine 1.9. Patient was admitted for further workup for CHF exacerbation.  Patient admitted with cardiogenic shock, new A. fib. He was started on milrinone, IV lasix. He also has acute on chronic renal failure. Cr and renal function stable. He underwent cardioversion 2-05, with hope this will improved Heart Function.   Subjective: Patient continues to improve, no new complaints.  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 -Continue management per cardiology  Atrial fibrillation- new onset; post cardioversion 2-05 -New onset A fib.  -Contributing to CHF -Patient on Eliquis -Patient on Amio 200 mg BID  -Maintaining NSR status post TEE DCCV  2/5  Acute kidney injury on CKD (chronic kidney disease) stage III -Slight increase in creatinine, may be related to aggressive diuresis -Cardiorenal syndrome -Continue to monitor renal function closely  Essential hypertension -BP stable, correct discontinued  due to low output -No ACE at this time given renal insufficiency  DM type 2 with diabetic peripheral neuropathy (HCC) -CBGs improved after increasing Lantus to 50 units -Continue SSI -Hemoglobin A1c 6.5  BPH (benign prostatic hyperplasia) -Continue alfuzosin  CAD -Continue Plavix, statin  Hyperlipidemia -Continue atorvastatin  Constipation; continue with Miralax. Add senokot. No significant BM yet,   DVT prophylaxis: Eliquis Code Status: DNR Family Communication: Wife at bedside Disposition Plan: Home when cleared by cardiology  Consultants:   Cardiology CHF team  Procedures:   Echo, TEE DCCV  Antimicrobials:  None   Objective: Vitals:   11/25/17 2325 11/26/17 0412 11/26/17 0500 11/26/17 0724  BP: 98/61 105/67  101/64  Pulse: 72 70  72  Resp: (!) 24 18  (!) 23  Temp: 98.3 F (36.8 C) 98 F (36.7 C)  98 F (36.7 C)  TempSrc: Oral Oral  Oral  SpO2: 94% 98%  98%  Weight:   81.9 kg (180 lb 9.6 oz)   Height:        Intake/Output Summary (Last 24 hours) at 11/26/2017 0928 Last data filed at 11/26/2017 0413 Gross per 24 hour  Intake -  Output 826 ml  Net -826 ml   Filed Weights   11/23/17 0324 11/24/17 0334 11/26/17 0500  Weight: 82.4 kg (181 lb 9.6 oz) 82.7 kg (182 lb 4.8 oz) 81.9 kg (180 lb 9.6 oz)    Examination:  General: NAD Cardiovascular: RRR, S1/S2, + JVP, CVP 15-18 Respiratory: Breath sounds diminished at the bases i Abdominal: Soft, NT, ND, bowel sounds + Extremities: no edema  Data Reviewed: I have personally reviewed following labs and imaging studies  CBC: Recent Labs  Lab 11/24/17 0526  WBC 6.1  HGB  11.3*  HCT 33.9*  MCV 89.2  PLT 203   Basic Metabolic Panel: Recent Labs  Lab 11/20/17 0752  11/22/17 0512 11/23/17 0330 11/24/17 0526 11/25/17 0500 11/26/17 0632  NA 139   < > 135 138 137 139 139  K 3.2*   < > 3.5 3.6 3.5 3.6 4.1  CL 96*   < > 95* 100* 99* 101 101  CO2 30   < > 27 26 26 27 27   GLUCOSE 115*   < >  287* 137* 159* 136* 126*  BUN 63*   < > 50* 42* 38* 34* 34*  CREATININE 2.30*   < > 2.05* 2.20* 2.05* 1.95* 2.04*  CALCIUM 8.8*   < > 8.4* 8.6* 8.4* 8.6* 8.6*  MG 2.2  --   --   --   --   --   --    < > = values in this interval not displayed.   GFR: Estimated Creatinine Clearance: 29.3 mL/min (A) (by C-G formula based on SCr of 2.04 mg/dL (H)). Liver Function Tests: No results for input(s): AST, ALT, ALKPHOS, BILITOT, PROT, ALBUMIN in the last 168 hours. No results for input(s): LIPASE, AMYLASE in the last 168 hours. No results for input(s): AMMONIA in the last 168 hours. Coagulation Profile: No results for input(s): INR, PROTIME in the last 168 hours. Cardiac Enzymes: No results for input(s): CKTOTAL, CKMB, CKMBINDEX, TROPONINI in the last 168 hours. BNP (last 3 results) No results for input(s): PROBNP in the last 8760 hours. HbA1C: No results for input(s): HGBA1C in the last 72 hours. CBG: Recent Labs  Lab 11/25/17 0825 11/25/17 1236 11/25/17 1613 11/25/17 2131 11/26/17 0816  GLUCAP 105* 230* 232* 168* 107*   Lipid Profile: No results for input(s): CHOL, HDL, LDLCALC, TRIG, CHOLHDL, LDLDIRECT in the last 72 hours. Thyroid Function Tests: No results for input(s): TSH, T4TOTAL, FREET4, T3FREE, THYROIDAB in the last 72 hours. Anemia Panel: No results for input(s): VITAMINB12, FOLATE, FERRITIN, TIBC, IRON, RETICCTPCT in the last 72 hours. Sepsis Labs: No results for input(s): PROCALCITON, LATICACIDVEN in the last 168 hours.  Recent Results (from the past 240 hour(s))  MRSA PCR Screening     Status: None   Collection Time: 11/19/17  2:43 PM  Result Value Ref Range Status   MRSA by PCR NEGATIVE NEGATIVE Final    Comment:        The GeneXpert MRSA Assay (FDA approved for NASAL specimens only), is one component of a comprehensive MRSA colonization surveillance program. It is not intended to diagnose MRSA infection nor to guide or monitor treatment for MRSA  infections. Performed at [facility], [address] [place], [place] [address]       Radiology Studies: No results found.    Scheduled Meds: . alfuzosin  10 mg Oral Daily  . amiodarone  200 mg Oral BID  . apixaban  5 mg Oral BID  . atorvastatin  40 mg Oral QHS  . famotidine  20 mg Oral Daily  . furosemide  80 mg Intravenous BID  . gabapentin  300 mg Oral BID  . insulin aspart  0-20 Units Subcutaneous TID WC  . insulin glargine  50 Units Subcutaneous q morning - 10a  . polyethylene glycol  17 g Oral BID  . potassium chloride SA  40 mEq Oral Daily  . senna  1 tablet Oral Daily  . sodium chloride flush  3 mL Intravenous Q12H  . sodium chloride flush  3 mL  Intravenous Q12H   Continuous Infusions: . sodium chloride    . sodium chloride    . sodium chloride       LOS: 8 days    Time spent: Total of 15 minutes spent with pt, greater than 50% of which was spent in discussion of  treatment, counseling and coordination of care   Latrelle Dodrill, MD Pager: Text Page via [email or web address]   If 7PM-7AM, please contact night-coverage [email or web address] 11/26/2017, 9:28 AM

## 2017-11-26 NOTE — Progress Notes (Signed)
Patient ID: Stanley Farmer, male   DOB: 08-Dec-1940, 77 y.o.   MRN: [id number]    Advanced Heart Failure Rounding Note   Subjective:    Spanish speaking only.    S/P TEE/DC-CV 2/6--> NSR, remains in NSR today.   Milrinone stopped, co-ox 52% early this morning.  IV Lasix given yesterday, some diuresis with weight down 2 lbs.  CVP remains 16 this morning.   Creatinine 1.95 => 2.04.   Objective:   Weight Range:  Vital Signs:   Temp:  [98 F (36.7 C)-98.7 F (37.1 C)] 98 F (36.7 C) (02/09 1133) Pulse Rate:  [70-75] 72 (02/09 1133) Resp:  [18-24] 20 (02/09 1133) BP: (91-111)/(49-77) 109/49 (02/09 1133) SpO2:  [94 %-100 %] 94 % (02/09 1133) Weight:  [180 lb 9.6 oz (81.9 kg)] 180 lb 9.6 oz (81.9 kg) (02/09 0500) Last BM Date: 11/26/17  Weight change: Filed Weights   11/23/17 0324 11/24/17 0334 11/26/17 0500  Weight: 181 lb 9.6 oz (82.4 kg) 182 lb 4.8 oz (82.7 kg) 180 lb 9.6 oz (81.9 kg)    Intake/Output:   Intake/Output Summary (Last 24 hours) at 11/26/2017 1152 Last data filed at 11/26/2017 1133 Gross per 24 hour  Intake -  Output 825 ml  Net -825 ml     Physical Exam: CVP 16 General: NAD Neck: JVP 12+ cm, no thyromegaly or thyroid nodule.  Lungs: Decreased breath sounds at bases.  CV: Lateral PMI.  Heart regular S1/S2, soft S3, no murmur.  1+ ankle edema.   Abdomen: Soft, nontender, no hepatosplenomegaly, mild distention.  Skin: Intact without lesions or rashes.  Neurologic: Alert and oriented x 3.  Psych: Normal affect. Extremities: No clubbing or cyanosis.  HEENT: Normal.   Telemetry:  NSR 70s personally reviewed.   Labs: Basic Metabolic Panel: Recent Labs  Lab 11/20/17 0752  11/22/17 0512 11/23/17 0330 11/24/17 0526 11/25/17 0500 11/26/17 0632  NA 139   < > 135 138 137 139 139  K 3.2*   < > 3.5 3.6 3.5 3.6 4.1  CL 96*   < > 95* 100* 99* 101 101  CO2 30   < > 27 26 26 27 27   GLUCOSE 115*   < > 287* 137* 159* 136* 126*  BUN 63*   < > 50* 42* 38* 34*  34*  CREATININE 2.30*   < > 2.05* 2.20* 2.05* 1.95* 2.04*  CALCIUM 8.8*   < > 8.4* 8.6* 8.4* 8.6* 8.6*  MG 2.2  --   --   --   --   --   --    < > = values in this interval not displayed.    Liver Function Tests: No results for input(s): AST, ALT, ALKPHOS, BILITOT, PROT, ALBUMIN in the last 168 hours. No results for input(s): LIPASE, AMYLASE in the last 168 hours. No results for input(s): AMMONIA in the last 168 hours.  CBC: Recent Labs  Lab 11/24/17 0526  WBC 6.1  HGB 11.3*  HCT 33.9*  MCV 89.2  PLT 203    Cardiac Enzymes: No results for input(s): CKTOTAL, CKMB, CKMBINDEX, TROPONINI in the last 168 hours.  BNP: BNP (last 3 results) Recent Labs    11/17/17 1831  BNP 1,047.1*    ProBNP (last 3 results) No results for input(s): PROBNP in the last 8760 hours.    Other results:  Imaging: No results found.   Medications:     Scheduled Medications: . alfuzosin  10 mg Oral Daily  . amiodarone  200 mg Oral BID  . apixaban  5 mg Oral BID  . atorvastatin  40 mg Oral QHS  . famotidine  20 mg Oral Daily  . furosemide  80 mg Intravenous BID  . gabapentin  300 mg Oral BID  . insulin aspart  0-20 Units Subcutaneous TID WC  . insulin glargine  50 Units Subcutaneous q morning - 10a  . metolazone  2.5 mg Oral Once  . polyethylene glycol  17 g Oral BID  . potassium chloride SA  40 mEq Oral Daily  . senna  1 tablet Oral Daily  . sodium chloride flush  3 mL Intravenous Q12H  . sodium chloride flush  3 mL Intravenous Q12H    Infusions: . sodium chloride    . sodium chloride    . sodium chloride      PRN Medications: sodium chloride, acetaminophen, bisacodyl, ondansetron (ZOFRAN) IV, sodium chloride flush, sodium chloride flush, sodium chloride flush   Assessment:    Katlin Pinedais a 77 y.o.Spanish speakingmalewith medical history significantfor combined HF, NICM, EF 10-15%, with moderately reduced RV function on 01/16/16. PMH also includes CKD stage III,  DM type II, hyperlipidemia, mild cognitive impairment, and remote alcohol tobacco abuse.   Plan/Discussion:     1. Acute on Chronic systolic HF -> cardiogenic shock: Unclear etiology. Long standing CMP, no h/o of cath, but nuclear stress 2011 with fixed defect and no ischemia.  Echo 07/2016 20%. Echo 11/18/17 shows EF 10%, with severe RV dysfunction.  PICC placed. Co-ox 33%, placed on milrinone.  Exacerbation with low output may have been triggered by afib/RVR.  Milrinone weaned off yesterday, co-ox 52%.  Still some volume overload with CVP 15-16.  Creatinine stable at 2.  - Leave off milrinone (poor candidate for home inotrope), will add digoxin 0.125 (follow levels closely with elevated creatinine).  Repeat co-ox.  - Lasix 80 mg IV bid today + 1 dose metolazone 2.5.  - No Coreg with low output.  No ACE/ARB with AKI - EP evaluated. CRT-D not thought to be beneficial.  - RV dysfunction, renal dysfunction, and social situation precludes VAD 2. Atrial fibrillation: Unclear chronicity. Likely related to his decompensation. CHADSVaSc is 8. Started Eliquis 2/1.  S/P TEE  - Continue amio 200 mg twice a day.  - Continue Eliquis 5 mg bid.  3. Elevated troponin: Suspect demand ischemia with volume overload.  4. Acute on CKD III: Creatinine fairly stable at 2.  5. H/o CVA: With cognitive deficits.   - Continue statin. Eliquis started.  6. DNR 7. Illiterate: Unable to read spanish or english.   Marca Ancona 11/26/2017

## 2017-11-27 LAB — GLUCOSE, CAPILLARY
GLUCOSE-CAPILLARY: 110 mg/dL — AB (ref 65–99)
Glucose-Capillary: 174 mg/dL — ABNORMAL HIGH (ref 65–99)

## 2017-11-27 LAB — BASIC METABOLIC PANEL
Anion gap: 12 (ref 5–15)
BUN: 37 mg/dL — AB (ref 6–20)
CALCIUM: 8.5 mg/dL — AB (ref 8.9–10.3)
CO2: 27 mmol/L (ref 22–32)
Chloride: 101 mmol/L (ref 101–111)
Creatinine, Ser: 2.02 mg/dL — ABNORMAL HIGH (ref 0.61–1.24)
GFR calc Af Amer: 35 mL/min — ABNORMAL LOW (ref >60)
GFR, EST NON AFRICAN AMERICAN: 30 mL/min — AB (ref >60)
GLUCOSE: 125 mg/dL — AB (ref 65–99)
Potassium: 3.2 mmol/L — ABNORMAL LOW (ref 3.5–5.1)
SODIUM: 140 mmol/L (ref 135–145)

## 2017-11-27 LAB — COOXEMETRY PANEL
CARBOXYHEMOGLOBIN: 1.5 % (ref 0.5–1.5)
METHEMOGLOBIN: 1.1 % (ref 0.0–1.5)
O2 Saturation: 53.8 %
Total hemoglobin: 12 g/dL (ref 12.0–16.0)

## 2017-11-27 MED ORDER — TORSEMIDE 20 MG PO TABS: 80.0000 mg | ORAL_TABLET | Freq: Two times a day (BID) | ORAL | 0 refills | Status: DC

## 2017-11-27 MED ORDER — DIGOXIN 125 MCG PO TABS: 0.1250 mg | ORAL_TABLET | Freq: Every day | ORAL | 0 refills | Status: DC

## 2017-11-27 MED ORDER — TORSEMIDE 20 MG PO TABS: 80.0000 mg | ORAL_TABLET | Freq: Two times a day (BID) | ORAL | Status: DC

## 2017-11-27 MED ORDER — APIXABAN 5 MG PO TABS: 5.0000 mg | ORAL_TABLET | Freq: Two times a day (BID) | ORAL | 0 refills | Status: DC

## 2017-11-27 MED ORDER — POTASSIUM CHLORIDE CRYS ER 20 MEQ PO TBCR
40.0000 meq | EXTENDED_RELEASE_TABLET | Freq: Once | ORAL | Status: AC
  Administered 2017-11-27: 40 meq via ORAL
  Filled 2017-11-27: qty 2

## 2017-11-27 MED ORDER — POTASSIUM CHLORIDE CRYS ER 20 MEQ PO TBCR: EXTENDED_RELEASE_TABLET | ORAL | 1 refills | Status: DC

## 2017-11-27 MED ORDER — AMIODARONE HCL 200 MG PO TABS: 200.0000 mg | ORAL_TABLET | Freq: Two times a day (BID) | ORAL | 0 refills | Status: DC

## 2017-11-27 MED ORDER — INSULIN GLARGINE 100 UNIT/ML ~~LOC~~ SOLN: 60.0000 [IU] | Freq: Every morning | SUBCUTANEOUS | 0 refills | Status: AC

## 2017-11-27 NOTE — Plan of Care (Signed)
Patient continues to progress toward care goals.  Milrinone has been discontinued; patient remains in NSR after Cardioversion.  Continued monitoring of CVP, fluid volume status, urinary output, cardiovascular function. Digoxin started.  Patient diuresing.  Expect possible discharge home within the next day or two w/follow-up per home health.

## 2017-11-27 NOTE — Discharge Summary (Signed)
Physician Discharge Summary  [place]  JOA:[id number]  DOB: [birth year]  DOA: 11/17/2017 PCP: Maris Berger, MD  Admit date: 11/17/2017 Discharge date: 11/27/2017  Admitted From: Home  Disposition:  Home   Recommendations for Outpatient Follow-up:  1. Follow up with PCP in 1 week  2. Please obtain BMP/CBC in one week to monitor Hgb and Cr  3. 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 BNP 1047, hypoxic with O2 sats 82% with ambulation. Creatinine 1.9. Patient was admitted for further workup for CHF exacerbation.  Patient admitted with cardiogenic shock, new A. fib. He was started on milrinone, IV lasix, he was also treated with amiodarone. He also has acute on chronic renal failure. Cr and renal function stable. He underwent cardioversion 2-05 and has maintained NSR. Heart failure team adjusted medications, and patient volume status stabilized. Patient was cleared by cardiology for discharge.   Subjective: Patient seen and examined, he has no new complaints.  His chest pain, shortness of breath and palpitation.  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 -Continue management per cardiology  Atrial fibrillation-new onset; post cardioversion 2/05 -New onset A fib.  -Contributing to CHF -Patient on Eliquis -Patient on Amio 200 mg BID  -Maintaining NSR status post TEE DCCV 2/5 -Follow-up with cardiology  Acute kidney injury on CKD (chronic kidney disease) stage III -Creatinine has plateaued, probably new baseline. -Cardiorenal syndrome -Monitor BMP in 1 week  Essential hypertension - BP stable -BP stable, Coreg discontinued due to low output -No ACE at this time given renal insufficiency  DM type 2 with diabetic peripheral neuropathy (HCC) -CBGs stable -Patient was started on Lantus 50 units daily with good control of CBGs, will d/c on 60 units given diet will not be well controlled at home. -Hemoglobin A1c 6.5 -Follow-up with PCP  BPH (benign prostatic hyperplasia) -Continue alfuzosin  CAD -Continue Plavix, statin  Hyperlipidemia -Continue atorvastatin  Constipation -Continue with Miralax. Having bowel movements  All other chronic medical condition were stable during the hospitalization.  Patient was seen by physical therapy, recommending home health PT On the day of the discharge the patient's vitals were stable, and no other acute medical condition were reported by patient. the patient was felt safe to be discharge to home  Discharge Instructions  You were cared for by a hospitalist during your hospital stay. If you have any questions about your discharge medications or the care you received while you were in the  hospital after you are discharged, you can call the unit and asked to speak with the hospitalist on  call if the hospitalist that took care of you is not available. Once you are discharged, your primary care physician will handle any further medical issues. Please note that NO REFILLS for any discharge medications will be authorized once you are discharged, as it is imperative that you return to your primary care physician (or establish a relationship with a primary care physician if you do not have one) for your aftercare needs so that they can reassess your need for medications and monitor your lab values.  Discharge Instructions    Avoid straining   Complete by:  As directed    Call MD for:  difficulty breathing, headache or visual disturbances   Complete by:  As directed    Call MD for:  extreme fatigue   Complete by:  As directed    Call MD for:  hives   Complete by:  As directed    Call MD for:  persistant dizziness or light-headedness   Complete by:  As directed    Call MD for:  persistant nausea and vomiting   Complete by:  As directed    Call MD for:  redness, tenderness, or signs of infection (pain, swelling, redness, odor or green/yellow discharge around incision site)   Complete by:  As directed    Call MD for:  severe uncontrolled pain   Complete by:  As directed    Call MD for:  temperature >100.4   Complete by:  As directed    Diet - low sodium heart healthy   Complete by:  As directed    Heart Failure patients record your daily weight using the same scale at the same time of day   Complete by:  As directed    Increase activity slowly   Complete by:  As directed    STOP any activity that causes chest pain, shortness of breath, dizziness, sweating, or exessive weakness   Complete by:  As directed      Allergies as of 11/27/2017   No Known Allergies     Medication List    STOP taking these medications   carvedilol 3.125 MG tablet Commonly known as:  COREG   clopidogrel 75 MG tablet Commonly known as:  PLAVIX   enalapril 2.5 MG tablet Commonly known as:   VASOTEC   metolazone 5 MG tablet Commonly known as:  ZAROXOLYN     TAKE these medications   alfuzosin 10 MG 24 hr tablet Commonly known as:  UROXATRAL Take 10 mg by mouth daily.   amiodarone 200 MG tablet Commonly known as:  PACERONE Take 1 tablet (200 mg total) by mouth 2 (two) times daily. For 1 week then 200 mg daily   apixaban 5 MG Tabs tablet Commonly known as:  ELIQUIS Take 1 tablet (5 mg total) by mouth 2 (two) times daily.   atorvastatin 40 MG tablet Commonly known as:  LIPITOR Take 40 mg by mouth at bedtime.   bisacodyl 5 MG EC tablet Commonly known as:  DULCOLAX Take 1 tablet (5 mg total) by mouth daily as needed for moderate constipation. What changed:  when to take this   digoxin 0.125 MG tablet Commonly known as:  LANOXIN Take 1 tablet (0.125 mg total) by mouth daily. Start taking on:  11/28/2017   famotidine 20 MG tablet Commonly known as:  PEPCID Take 1 tablet (20 mg total) by mouth 2 (  two) times daily.   gabapentin 300 MG capsule Commonly known as:  NEURONTIN Take 300 mg by mouth 3 (three) times daily.   insulin aspart 100 UNIT/ML injection Commonly known as:  novoLOG Inject 20-32 Units into the skin 3 (three) times daily with meals. Use 20 units every morning, use 28-32 units at lunch and 28-32 units every evening   insulin glargine 100 UNIT/ML injection Commonly known as:  LANTUS Inject 0.6 mLs (60 Units total) into the skin every morning. What changed:  how much to take   nitroGLYCERIN 0.4 MG/SPRAY spray Commonly known as:  NITROLINGUAL Place 1 spray under the tongue every 5 (five) minutes x 3 doses as needed for chest pain.   polyethylene glycol powder powder Commonly known as:  GLYCOLAX/MIRALAX Take 17 g by mouth 2 (two) times daily. What changed:  Another medication with the same name was removed. Continue taking this medication, and follow the directions you see here.   potassium chloride SA 20 MEQ tablet Commonly known as:  KLOR-CON  M20 TAKE 40 mg TABLET BY MOUTH EVERY DAY What changed:    how much to take  how to take this  when to take this  additional instructions   torsemide 20 MG tablet Commonly known as:  DEMADEX Take 4 tablets (80 mg total) by mouth 2 (two) times daily. What changed:  when to take this      Follow-up Information    Maris Berger, MD. Schedule an appointment as soon as possible for a visit in 1 week(s).   Specialty:  Family Medicine Why:  Hospital follow-up Contact information: [address] [place] [address] [phone]        [facility]. Schedule an appointment as soon as possible for a visit in 1 week(s).   Specialty:  Cardiology Why:  Hospital follow-up Contact information: [address] [id number] [place] [phone]         No Known Allergies  Consultations:  Heart failure team    Procedures/Studies: Dg Chest Port 1 View  Result Date: 11/19/2017 CLINICAL DATA:  PICC line placement EXAM: PORTABLE CHEST 1 VIEW COMPARISON:  11/17/2017 FINDINGS: Lungs are clear.  No pleural effusion or pneumothorax. Cardiomegaly. Right arm PICC terminates in the lower SVC. IMPRESSION: Right arm PICC terminates in the lower SVC. Electronically Signed   By: Julian Hy M.D.   On: 11/19/2017 13:44   Dg Abd Acute W/chest  Result Date: 11/17/2017 CLINICAL DATA:  Abdominal distention and shortness of breath. EXAM: DG ABDOMEN ACUTE W/ 1V CHEST COMPARISON:  01/19/2016 FINDINGS: Cardiac enlargement. Linear atelectasis or fibrosis in the left mid and lower lung. No airspace disease or consolidation. No pulmonary vascular congestion. No blunting of costophrenic angles. No pneumothorax. Calcification of the aorta. Mediastinal contours appear intact. Gas and stool throughout the colon. No small or large bowel distention. No free intraperitoneal air. No abnormal air-fluid levels. No  radiopaque stones. Calcified granuloma in the right cardiophrenic angle. Surgical clips in the right upper quadrant. Degenerative changes in the spine and hips. Vascular calcifications. IMPRESSION: Cardiac enlargement.  No evidence of active pulmonary disease. Nonobstructive bowel gas pattern with scattered stool throughout the colon. Electronically Signed   By: Lucienne Capers M.D.   On: 11/17/2017 21:36   Impressions:  - LVEF<10%, severely dilated ventricle, global hypokinesis,   calcified aortic valve with poor leaflet excursion (mostly due to   very low cardiac output), trivial  AI, moderate MR with mild   mitral stenosis and moderate MAC, moderate biatrial enlargement,   severe pulmonary hypertension with RVSP of 76 mmHg, dilated IVC.   Discharge Exam: Vitals:   11/27/17 0746 11/27/17 1120  BP: 110/63 (!) 99/56  Pulse: 64 67  Resp: 18 (!) 21  Temp: 98.2 F (36.8 C) 98.3 F (36.8 C)  SpO2: 95% 97%   Vitals:   11/27/17 0337 11/27/17 0629 11/27/17 0746 11/27/17 1120  BP: 112/68  110/63 (!) 99/56  Pulse: 69  64 67  Resp: (!) 23  18 (!) 21  Temp: 98.3 F (36.8 C)  98.2 F (36.8 C) 98.3 F (36.8 C)  TempSrc: Oral  Oral Oral  SpO2: 96%  95% 97%  Weight:  81.7 kg (180 lb 1.9 oz)    Height:        General: Pt is alert, awake, not in acute distress Cardiovascular: RRR, S1/S2 +, no rubs, no gallops Respiratory: CTA bilaterally, no wheezing, no rhonchi Abdominal: Soft, NT, ND, bowel sounds + Extremities: LE trace edema   The results of significant diagnostics from this hospitalization (including imaging, microbiology, ancillary and laboratory) are listed below for reference.     Microbiology: Recent Results (from the past 240 hour(s))  MRSA PCR Screening     Status: None   Collection Time: 11/19/17  2:43 PM  Result Value Ref Range Status   MRSA by PCR NEGATIVE NEGATIVE Final    Comment:        The GeneXpert MRSA Assay (FDA approved for NASAL specimens only), is one  component of a comprehensive MRSA colonization surveillance program. It is not intended to diagnose MRSA infection nor to guide or monitor treatment for MRSA infections. Performed at [facility], [place] [address], [place], [place] [address]      Labs: BNP (last 3 results) Recent Labs    11/17/17 1831  BNP 9,449.6*   Basic Metabolic Panel: Recent Labs  Lab 11/23/17 0330 11/24/17 0526 11/25/17 0500 11/26/17 0632 11/27/17 0444  NA 138 137 139 139 140  K 3.6 3.5 3.6 4.1 3.2*  CL 100* 99* 101 101 101  CO2 _0 GLUCOSE 137* 159* 136* 126* 125*  BUN 42* 38* 34* 34* 37*  CREATININE 2.20* 2.05* 1.95* 2.04* 2.02*  CALCIUM 8.6* 8.4* 8.6* 8.6* 8.5*   Liver Function Tests: No results for input(s): AST, ALT, ALKPHOS, BILITOT, PROT, ALBUMIN in the last 168 hours. No results for input(s): LIPASE, AMYLASE in the last 168 hours. No results for input(s): AMMONIA in the last 168 hours. CBC: Recent Labs  Lab 11/24/17 0526  WBC 6.1  HGB 11.3*  HCT 33.9*  MCV 89.2  PLT 203   Cardiac Enzymes: No results for input(s): CKTOTAL, CKMB, CKMBINDEX, TROPONINI in the last 168 hours. BNP: Invalid input(s): POCBNP CBG: Recent Labs  Lab 11/26/17 1226 11/26/17 1638 11/26/17 2137 11/27/17 0812 11/27/17 1257  GLUCAP 201* 147* 127* 110* 174*   D-Dimer No results for input(s): DDIMER in the last 72 hours. Hgb A1c No results for input(s): HGBA1C in the last 72 hours. Lipid Profile No results for input(s): CHOL, HDL, LDLCALC, TRIG, CHOLHDL, LDLDIRECT in the last 72 hours. Thyroid function studies No results for input(s): TSH, T4TOTAL, T3FREE, THYROIDAB in the last 72 hours.  Invalid input(s): FREET3 Anemia work up No results for input(s): VITAMINB12, FOLATE, FERRITIN, TIBC, IRON, RETICCTPCT in the last 72 hours. Urinalysis    Component Value Date/Time   COLORURINE  STRAW (A) 11/17/2017 2330   APPEARANCEUR CLEAR 11/17/2017 2330   LABSPEC 1.008 11/17/2017 2330    PHURINE 8.0 11/17/2017 2330   GLUCOSEU NEGATIVE 11/17/2017 2330   HGBUR NEGATIVE 11/17/2017 2330   BILIRUBINUR NEGATIVE 11/17/2017 2330   KETONESUR NEGATIVE 11/17/2017 2330   PROTEINUR NEGATIVE 11/17/2017 2330   UROBILINOGEN 1.0 08/03/2015 2035   NITRITE NEGATIVE 11/17/2017 2330   LEUKOCYTESUR NEGATIVE 11/17/2017 2330   Sepsis Labs Invalid input(s): PROCALCITONIN,  WBC,  LACTICIDVEN Microbiology Recent Results (from the past 240 hour(s))  MRSA PCR Screening     Status: None   Collection Time: 11/19/17  2:43 PM  Result Value Ref Range Status   MRSA by PCR NEGATIVE NEGATIVE Final    Comment:        The GeneXpert MRSA Assay (FDA approved for NASAL specimens only), is one component of a comprehensive MRSA colonization surveillance program. It is not intended to diagnose MRSA infection nor to guide or monitor treatment for MRSA infections. Performed at [facility], [place] [address], [place], [place] [address]      Time coordinating discharge: 35 minutes  SIGNED:  Chipper Oman, MD  [facility] 11/27/2017, 1:47 PM  Pager please text page via  [email or web address]

## 2017-11-27 NOTE — Care Management Note (Signed)
Case Management Note Previous CM note completed by Cherylann Parr, RN--11/25/2017, 8:40 AM   Patient Details  Name: Stanley Farmer MRN: [id number] Date of Birth: [birth year]  Subjective/Objective:   Pt admitted with cardiogenic shock                  Action/Plan:  PTA independent from home with wife.  Pt will go home on Eliquis - CM submitted benefit check and provided pt free 30 day card.  Wife states she will get prescription filled at [facility] on [facility].  Pt informed he needs to weigh daily and adhere to low salt diet.  CM discussed above with pt and wife via interpretor.     Expected Discharge Date:  11/27/17               Expected Discharge Plan:  Home w Home Health Services  In-House Referral:  NA  Discharge planning Services  CM Consult  Post Acute Care Choice:  Durable Medical Equipment, Home Health Choice offered to:  Spouse, Patient  DME Arranged:  3-N-1 DME Agency:  [facility]  HH Arranged:  RN, PT, OT, Nurse's Aide HH Agency:  [facility]  Status of Service:  Completed, signed off  If discussed at Long Length of Stay Meetings, dates discussed:    Discharge Disposition: home/home health   Additional Comments:  11/27/17- 1500- Azalea Cedar RN, CM- pt for d/c home today- spoke with pt and HH/DME orders to be placed- per previous CM notes- choice for [facility] agency is [facility]- referral called to [place] with [facility] for [facility] and DME needs- pt will need interpreter when [facility] calls- Jermaine aware- 3n1 to be delivered to room prior to discharge.   Cherylann Parr, RN 11/25/2017, 8:40 AM--CM requested HH orders including RN for medication management and DME orders via text and also on physician sticky tab.    Eliquis Benefit Check # 2 Deniece Portela  @ [employer] # [phone]    1.ELIQUIS 2.50 MG BID  COVER- YES  CO-PAY- $ 3.80   TIER- 3 DRUG  PRIOR APPROVAL- NO    2. ELIQUIS 5 MG BID  COVER- YES  CO-PAY- $ 3.80   TIER- 3 DRUG  PRIOR  APPROVAL- NO    Darrold Span, RN 11/27/2017, 3:07 PM [phone] 2C weekend CM coverage

## 2017-11-27 NOTE — Progress Notes (Signed)
Patient ID: Stanley Farmer, male   DOB: November 25, 1940, 77 y.o.   MRN: [id number]    Advanced Heart Failure Rounding Note   Subjective:    Spanish speaking only.    S/P TEE/DC-CV 2/6--> NSR, remains in NSR today.   Co-ox 54% off milrinone.  He diuresed with IV Lasix + metolazone yesterday, net negative 1660 cc.  Denies dyspnea and wants to go home.   Creatinine 1.95 => 2.04 => 2.02.   Objective:   Weight Range:  Vital Signs:   Temp:  [97.9 F (36.6 C)-98.5 F (36.9 C)] 98.3 F (36.8 C) (02/10 1120) Pulse Rate:  [64-69] 67 (02/10 1120) Resp:  [18-23] 21 (02/10 1120) BP: (93-112)/(56-68) 99/56 (02/10 1120) SpO2:  [95 %-97 %] 97 % (02/10 1120) Weight:  [180 lb 1.9 oz (81.7 kg)] 180 lb 1.9 oz (81.7 kg) (02/10 0629) Last BM Date: 11/26/17  Weight change: Filed Weights   11/24/17 0334 11/26/17 0500 11/27/17 0629  Weight: 182 lb 4.8 oz (82.7 kg) 180 lb 9.6 oz (81.9 kg) 180 lb 1.9 oz (81.7 kg)    Intake/Output:   Intake/Output Summary (Last 24 hours) at 11/27/2017 1147 Last data filed at 11/27/2017 1120 Gross per 24 hour  Intake 502 ml  Output 2800 ml  Net -2298 ml     Physical Exam: General: NAD Neck: JVP 8 cm, no thyromegaly or thyroid nodule.  Lungs: Clear to auscultation bilaterally with normal respiratory effort. CV: Nondisplaced PMI.  Heart regular S1/S2, no S3/S4, no murmur.  1+ ankle edema.   Abdomen: Soft, nontender, no hepatosplenomegaly, no distention.  Skin: Intact without lesions or rashes.  Neurologic: Alert and oriented x 3.  Psych: Normal affect. Extremities: No clubbing or cyanosis.  HEENT: Normal.   Telemetry:  NSR 70s personally reviewed.   Labs: Basic Metabolic Panel: Recent Labs  Lab 11/23/17 0330 11/24/17 0526 11/25/17 0500 11/26/17 0632 11/27/17 0444  NA 138 137 139 139 140  K 3.6 3.5 3.6 4.1 3.2*  CL 100* 99* 101 101 101  CO2 26 26 27 27 27   GLUCOSE 137* 159* 136* 126* 125*  BUN 42* 38* 34* 34* 37*  CREATININE 2.20* 2.05* 1.95*  2.04* 2.02*  CALCIUM 8.6* 8.4* 8.6* 8.6* 8.5*    Liver Function Tests: No results for input(s): AST, ALT, ALKPHOS, BILITOT, PROT, ALBUMIN in the last 168 hours. No results for input(s): LIPASE, AMYLASE in the last 168 hours. No results for input(s): AMMONIA in the last 168 hours.  CBC: Recent Labs  Lab 11/24/17 0526  WBC 6.1  HGB 11.3*  HCT 33.9*  MCV 89.2  PLT 203    Cardiac Enzymes: No results for input(s): CKTOTAL, CKMB, CKMBINDEX, TROPONINI in the last 168 hours.  BNP: BNP (last 3 results) Recent Labs    11/17/17 1831  BNP 1,047.1*    ProBNP (last 3 results) No results for input(s): PROBNP in the last 8760 hours.    Other results:  Imaging: No results found.   Medications:     Scheduled Medications: . alfuzosin  10 mg Oral Daily  . amiodarone  200 mg Oral BID  . apixaban  5 mg Oral BID  . atorvastatin  40 mg Oral QHS  . digoxin  0.125 mg Oral Daily  . famotidine  20 mg Oral Daily  . gabapentin  300 mg Oral BID  . insulin aspart  0-20 Units Subcutaneous TID WC  . insulin glargine  50 Units Subcutaneous q morning - 10a  . polyethylene glycol  17 g Oral BID  . potassium chloride SA  40 mEq Oral Daily  . potassium chloride  40 mEq Oral Once  . senna  1 tablet Oral Daily  . sodium chloride flush  3 mL Intravenous Q12H  . sodium chloride flush  3 mL Intravenous Q12H  . torsemide  80 mg Oral BID    Infusions: . sodium chloride    . sodium chloride    . 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 PMH also includes CKD stage III, DM type II, hyperlipidemia, mild cognitive impairment, and remote alcohol tobacco abuse.   Plan/Discussion:      1. Acute on Chronic systolic HF -> cardiogenic shock: Unclear etiology. Long standing CMP, no h/o of cath, but nuclear stress 2011 with fixed defect and no ischemia.  Echo 07/2016 20%. Echo 11/18/17 shows EF 10%, with severe RV dysfunction.  PICC placed. Co-ox 33%, placed on milrinone.  Exacerbation with low output may have been triggered by afib/RVR.  Milrinone weaned off 2/8, co-ox 54% today.  Creatinine stable at 2.  He still has some volume but not severely overloaded.  Diuresed reasonably yesterday.  Wants to go home today.   - Leave off milrinone (poor candidate for home inotrope), continue digoxin 0.125 (follow levels closely with elevated creatinine).  - Stop IV Lasix, torsemide 80 mg bid with KCl 40 daily for home.  - No Coreg with low output.  No ACE/ARB with AKI - EP evaluated. CRT-D not thought to be beneficial.  - RV dysfunction, renal dysfunction, and social situation precludes VAD 2. Atrial fibrillation: Unclear chronicity. Likely related to his decompensation. CHADSVaSc is 8. Started Eliquis 2/1.  S/P TEE  - Continue amio 200 mg twice a day x 1 week longer then 200 mg daily.  - Continue Eliquis 5 mg bid.  3. Elevated troponin: Suspect demand ischemia with volume overload.  4. Acute on CKD III: Creatinine fairly stable at 2.  5. H/o CVA: With cognitive deficits.   - Continue statin. Eliquis started.  6. DNR 7. Illiterate: Unable to read spanish or english.   From my standpoint, he could go home today.  Followup in CHF clinic in 1 week.  Cardiac meds for discharge: digoxin 0.125 daily, torsemide 80 mg bid, KCl 40 daily, atorvastatin 40 daily, apixaban 5 bid, amiodarone 200 mg bid x 1 week then 200 mg daily after that.   Marca Ancona 11/27/2017

## 2017-11-27 NOTE — Progress Notes (Signed)
Tried to get pt to go for a walk in the hall but he refuses at this time. He states he just wants to sit on the side of the bed.

## 2017-11-27 NOTE — Progress Notes (Signed)
Reviewed discharge instructions, printed in Spanish and [place], with pt, spouse and english speaking son Burley Saver. Reviewed new medications and how to take, discontinued medications and medications that have a dose change. Son states he and his sister give pt his medications. I spoke with CM Kristi about DME equipment, pt does not want to wait for it to be brought to hospital so son states he will go pick it up. CM instructed me to print order and give it to son to take to [facility] on [facility]. Son stated he will pick it up tomorrow for pt. Reviewed follow up appts and instructions for son to call Heart Failure clinic Monday morning 11/28/17 to make pt an appt. Son verbalized understanding on all instructions. Pt discharged in wheelchair by NT

## 2017-11-27 NOTE — Progress Notes (Signed)
Assessment done with video interpreter, spouse at bedside. Pt is HOH. Pt denies pain and states he "feels good" and wants to go home. Reviewed medication with pt and spouse using video interpreter. Both verbalize that they understand and do not have any questions about medications. Pt sitting on the side of the bed eating breakfast.

## 2017-12-05 ENCOUNTER — Encounter (HOSPITAL_COMMUNITY): Payer: Self-pay

## 2017-12-05 ENCOUNTER — Ambulatory Visit (HOSPITAL_COMMUNITY)
Admission: RE | Admit: 2017-12-05 | Discharge: 2017-12-05 | Disposition: A | Discharge status: Home / Self Care | Payer: Medicare Other | Source: Ambulatory Visit | Attending: Internal Medicine | Admitting: Internal Medicine

## 2017-12-05 VITALS — BP 136/68 | HR 75 | Wt 190.8 lb

## 2017-12-05 DIAGNOSIS — I10 Essential (primary) hypertension: Secondary | ICD-10-CM | POA: Diagnosis not present

## 2017-12-05 DIAGNOSIS — N183 Chronic kidney disease, stage 3 unspecified: Secondary | ICD-10-CM

## 2017-12-05 DIAGNOSIS — I69319 Unspecified symptoms and signs involving cognitive functions following cerebral infarction: Secondary | ICD-10-CM | POA: Diagnosis not present

## 2017-12-05 DIAGNOSIS — E785 Hyperlipidemia, unspecified: Secondary | ICD-10-CM | POA: Insufficient documentation

## 2017-12-05 DIAGNOSIS — Z550 Illiteracy and low-level literacy: Secondary | ICD-10-CM | POA: Diagnosis not present

## 2017-12-05 DIAGNOSIS — Z789 Other specified health status: Secondary | ICD-10-CM | POA: Diagnosis not present

## 2017-12-05 DIAGNOSIS — N179 Acute kidney failure, unspecified: Secondary | ICD-10-CM | POA: Diagnosis not present

## 2017-12-05 DIAGNOSIS — E1122 Type 2 diabetes mellitus with diabetic chronic kidney disease: Secondary | ICD-10-CM | POA: Diagnosis not present

## 2017-12-05 DIAGNOSIS — Z794 Long term (current) use of insulin: Secondary | ICD-10-CM | POA: Insufficient documentation

## 2017-12-05 DIAGNOSIS — Z87891 Personal history of nicotine dependence: Secondary | ICD-10-CM | POA: Insufficient documentation

## 2017-12-05 DIAGNOSIS — Z66 Do not resuscitate: Secondary | ICD-10-CM | POA: Insufficient documentation

## 2017-12-05 DIAGNOSIS — I130 Hypertensive heart and chronic kidney disease with heart failure and stage 1 through stage 4 chronic kidney disease, or unspecified chronic kidney disease: Secondary | ICD-10-CM | POA: Insufficient documentation

## 2017-12-05 DIAGNOSIS — Z79899 Other long term (current) drug therapy: Secondary | ICD-10-CM | POA: Insufficient documentation

## 2017-12-05 DIAGNOSIS — I5023 Acute on chronic systolic (congestive) heart failure: Secondary | ICD-10-CM | POA: Insufficient documentation

## 2017-12-05 DIAGNOSIS — Z833 Family history of diabetes mellitus: Secondary | ICD-10-CM | POA: Insufficient documentation

## 2017-12-05 DIAGNOSIS — I480 Paroxysmal atrial fibrillation: Secondary | ICD-10-CM | POA: Insufficient documentation

## 2017-12-05 DIAGNOSIS — I5042 Chronic combined systolic (congestive) and diastolic (congestive) heart failure: Secondary | ICD-10-CM

## 2017-12-05 DIAGNOSIS — Z7901 Long term (current) use of anticoagulants: Secondary | ICD-10-CM | POA: Diagnosis not present

## 2017-12-05 LAB — BASIC METABOLIC PANEL
ANION GAP: 8 (ref 5–15)
BUN: 25 mg/dL — ABNORMAL HIGH (ref 6–20)
CALCIUM: 9 mg/dL (ref 8.9–10.3)
CO2: 29 mmol/L (ref 22–32)
Chloride: 106 mmol/L (ref 101–111)
Creatinine, Ser: 1.91 mg/dL — ABNORMAL HIGH (ref 0.61–1.24)
GFR calc Af Amer: 37 mL/min — ABNORMAL LOW (ref >60)
GFR, EST NON AFRICAN AMERICAN: 32 mL/min — AB (ref >60)
GLUCOSE: 76 mg/dL (ref 65–99)
Potassium: 4.2 mmol/L (ref 3.5–5.1)
SODIUM: 143 mmol/L (ref 135–145)

## 2017-12-05 MED ORDER — METOLAZONE 2.5 MG PO TABS: 2.5000 mg | ORAL_TABLET | ORAL | 0 refills | Status: DC

## 2017-12-05 MED ORDER — AMIODARONE HCL 200 MG PO TABS: 200.0000 mg | ORAL_TABLET | Freq: Every day | ORAL | 11 refills | Status: DC

## 2017-12-05 NOTE — Patient Instructions (Signed)
DECREASE Amiodarone to 200 mg tablet ONCE daily.  Routine lab work today. Will notify you of abnormal results, otherwise no news is good news!  Wear compression stockings daily. Remove for sleeping and bathing. Available at any medical supply store (take your prescription paper with you). [employer] (closes medical store from here): Address: [address] [place], [place] [address]  Phone: [phone]  Follow up 2 weeks with Otilio Saber PA-C.  Take all medication as prescribed the day of your appointment. Bring all medications with you to your appointment.  Do the following things EVERYDAY: 1) Weigh yourself in the morning before breakfast. Write it down and keep it in a log. 2) Take your medicines as prescribed 3) Eat low salt foods-Limit salt (sodium) to 2000 mg per day.  4) Stay as active as you can everyday 5) Limit all fluids for the day to less than 2 liters

## 2017-12-05 NOTE — Progress Notes (Signed)
Advanced Heart Failure Clinic Note   Referring Physician: PCP: [name], MD PCP-Cardiologist: [facility] - Elissa Hefty, NP  HPI:  Stanley Farmer is a 77 y.o. 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 PMH also includes CKD stage III, DM type II, hyperlipidemia, mild cognitive impairment, and remote alcohol tobacco abuse.  Admitted 2/1 - 11/27/17 with a/c systolic CHF in setting of AFib with RVR. Coox placed with concerns for low output. Initial coox 33% so placed on milrinone.  Diuresed with IV lasix and metolazone. S/p TEE DCCV 11/23/2017. Once back in NSR, pt tolerated milrinone wean.  HF meds adjusted as tolerated.   Pt not LVAD candidate with RV dysfunction, CKD III-IV, and poor social situation.   He presents today for post hospital follow up. Remains in NSR by EKG. Feeling better.  He does not feel like he makes as much urine out torsemide alone than he did with lasix + metolazone. Taking all medications as directed. He continues to have mild SOB walking around the hose. + bendopnea. Lives at home with wife. Son helps with medicines. Denies CP, palpitations, lightheadedness or dizziness.   EKG today shows NSR  Review of systems complete and found to be negative unless listed in HPI.    Past Medical History:  Diagnosis Date  . AKI (acute kidney injury) (HCC) 11/20/2017  . CHF (congestive heart failure) (HCC)   . CKD (chronic kidney disease)   . 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 Diabetes mellitus without complication (HCC)   . Hypertension   . 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 PAF (paroxysmal atrial fibrillation) (HCC) 11/20/2017    Current Outpatient Medications  Medication Sig Dispense Refill  . alfuzosin (UROXATRAL) 10 MG 24 hr tablet Take 10 mg by mouth daily.    Marland Kitchen amiodarone (PACERONE) 200 MG tablet Take 1 tablet (200 mg  total) by mouth 2 (two) times daily. For 1 week then 200 mg daily 60 tablet 0  . apixaban (ELIQUIS) 5 MG TABS tablet Take 1 tablet (5 mg total) by mouth 2 (two) times daily. 60 tablet 0  . atorvastatin (LIPITOR) 40 MG tablet Take 40 mg by mouth at bedtime.     . digoxin (LANOXIN) 0.125 MG tablet Take 1 tablet (0.125 mg total) by mouth daily. 30 tablet 0  . famotidine (PEPCID) 20 MG tablet Take 1 tablet (20 mg total) by mouth 2 (two) times daily. 30 tablet 0  . insulin glargine (LANTUS) 100 UNIT/ML injection Inject 0.6 mLs (60 Units total) into the skin every morning. 30 mL 0  . nitroGLYCERIN (NITROLINGUAL) 0.4 MG/SPRAY spray Place 1 spray under the tongue every 5 (five) minutes x 3 doses as needed for chest pain.    . potassium chloride SA (KLOR-CON M20) 20 MEQ tablet TAKE 40 mg TABLET BY MOUTH EVERY DAY 30 tablet 1  . torsemide (DEMADEX) 20 MG tablet Take 4 tablets (80 mg total) by mouth 2 (two) times daily. 240 tablet 0  . bisacodyl (DULCOLAX) 5 MG EC tablet Take 1 tablet (5 mg total) by mouth daily as needed for moderate constipation. (Patient taking differently: Take 5 mg by mouth 2 (two) times daily. ) 14 tablet 0  . gabapentin (NEURONTIN) 300 MG capsule Take 300 mg by mouth 3 (three) times daily.    . insulin aspart (NOVOLOG) 100 UNIT/ML injection Inject 20-32 Units into the skin 3 (three) times daily with meals. Use 20  units every morning, use 28-32 units at lunch and 28-32 units every evening    . polyethylene glycol powder (GLYCOLAX/MIRALAX) powder Take 17 g by mouth 2 (two) times daily.  11   No current facility-administered medications for this encounter.     No Known Allergies    Social History   Socioeconomic History  . Marital status: Married    Spouse name: Not on file  . Number of children: Not on file  . Years of education: Not on file  . Highest education level: Not on file  Social Needs  . Financial resource strain: Not on file  . Food insecurity - worry: Not on file    . Food insecurity - inability: Not on file  . Transportation needs - medical: Not on file  . Transportation needs - non-medical: Not on file  Occupational History  . Not on file  Tobacco Use  . Smoking status: Former Games developer  . Smokeless tobacco: Never Used  Substance and Sexual Activity  . Alcohol use: No  . Drug use: No  . Sexual activity: Not Currently  Other Topics Concern  . Not on file  Social History Narrative  . Not on file   Family History  Problem Relation Age of Onset  . Diabetes Mother     Vitals:   12/05/17 1128  BP: 136/68  Pulse: 75  SpO2: 100%  Weight: 190 lb 12.8 oz (86.5 kg)   Wt Readings from Last 3 Encounters:  12/05/17 190 lb 12.8 oz (86.5 kg)  11/27/17 180 lb 1.9 oz (81.7 kg)  01/17/16 180 lb 4.8 oz (81.8 kg)    PHYSICAL EXAM: General: Fatigued appearing. NAD. Interpreter present HEENT: Normal Neck: supple. JVP 8-9 with mild HJR. Carotids 2+ bilat; no bruits. No lymphadenopathy or thyromegaly appreciated. Cor: PMI lateral. Regular rate & rhythm. No rubs, gallops or murmurs. Lungs: Diminished Abdomen: soft, nontender, distended. No hepatosplenomegaly. No bruits or masses. Good bowel sounds. Extremities: no cyanosis, clubbing, rash, 2+ peripheral edema. Neuro: alert & oriented x 3, cranial nerves grossly intact. moves all 4 extremities w/o difficulty. Affect pleasant.  ECG: NSR 73 bpm  ASSESSMENT & PLAN:  1. Acute on Chronic systolic HF -> cardiogenic shock: Unclear etiology. Long standing CMP, no h/o of cath, but nuclear stress 2011 with fixed defect and no ischemia.  Echo 07/2016 20%.Echo 11/18/17 shows EF 10%, with severe RV dysfunction.  - NYHA III-IIIB - Volume status elevated on exam.  - Continue torsemide 80 mg bid with KCl 40 daily for home. - Add metolazone 2.5 mg x 1 tomorrow.  - Continue digoxin 0.125, check level next visit.  - BMET today - No Coreg with low output.  No ACE/ARB with AKI - EP evaluated. CRT-D not thought to be  beneficial.  - RV dysfunction, renal dysfunction, and social situation precludes VAD 2. Atrial fibrillation: Unclear chronicity.Likely related to his decompensation. CHADSVaScis 8. - Started Eliquis 2/1.  S/P TEE/DCCV 11/23/17. Remains in NSR by EKG.  - Decrease amio to 200 mg daily.  3. Acute on CKD III:  - BMET today.  4. H/o CVA: With cognitive deficits.  - Continue statin. Continue eliquis. 5. Illiterate: Unable to read spanish or english. Interpreter present. 6. DNR - Pt is not a candidate for advanced therapies with age, RV dysfunction, CKD, and poor social situation.   Remains volume overloaded on exam. RTC 2 weeks for re-assessment. Prognosis guarded. He is poor candidate for advanced therapies including home inotrope support.   Casimiro Needle  Alejandro Mulling, PA-C 12/05/17   Greater than 50% of the 25 minute visit was spent in counseling/coordination of care regarding disease state education, salt/fluid restriction, sliding scale diuretics, and medication compliance.

## 2017-12-23 ENCOUNTER — Encounter (HOSPITAL_COMMUNITY): Payer: Self-pay

## 2017-12-23 ENCOUNTER — Ambulatory Visit (HOSPITAL_COMMUNITY)
Admission: RE | Admit: 2017-12-23 | Discharge: 2017-12-23 | Disposition: A | Discharge status: Home / Self Care | Payer: Medicare Other | Source: Ambulatory Visit | Attending: Cardiology | Admitting: Cardiology

## 2017-12-23 VITALS — BP 118/58 | HR 62 | Wt 175.6 lb

## 2017-12-23 DIAGNOSIS — Z79899 Other long term (current) drug therapy: Secondary | ICD-10-CM | POA: Diagnosis not present

## 2017-12-23 DIAGNOSIS — I130 Hypertensive heart and chronic kidney disease with heart failure and stage 1 through stage 4 chronic kidney disease, or unspecified chronic kidney disease: Secondary | ICD-10-CM | POA: Diagnosis not present

## 2017-12-23 DIAGNOSIS — I10 Essential (primary) hypertension: Secondary | ICD-10-CM | POA: Diagnosis not present

## 2017-12-23 DIAGNOSIS — I5022 Chronic systolic (congestive) heart failure: Secondary | ICD-10-CM | POA: Diagnosis not present

## 2017-12-23 DIAGNOSIS — Z794 Long term (current) use of insulin: Secondary | ICD-10-CM | POA: Insufficient documentation

## 2017-12-23 DIAGNOSIS — I480 Paroxysmal atrial fibrillation: Secondary | ICD-10-CM | POA: Diagnosis not present

## 2017-12-23 DIAGNOSIS — N183 Chronic kidney disease, stage 3 unspecified: Secondary | ICD-10-CM

## 2017-12-23 DIAGNOSIS — I420 Dilated cardiomyopathy: Secondary | ICD-10-CM | POA: Insufficient documentation

## 2017-12-23 DIAGNOSIS — I69319 Unspecified symptoms and signs involving cognitive functions following cerebral infarction: Secondary | ICD-10-CM | POA: Diagnosis not present

## 2017-12-23 DIAGNOSIS — Z833 Family history of diabetes mellitus: Secondary | ICD-10-CM | POA: Insufficient documentation

## 2017-12-23 DIAGNOSIS — Z7901 Long term (current) use of anticoagulants: Secondary | ICD-10-CM | POA: Diagnosis not present

## 2017-12-23 DIAGNOSIS — E1142 Type 2 diabetes mellitus with diabetic polyneuropathy: Secondary | ICD-10-CM | POA: Diagnosis not present

## 2017-12-23 DIAGNOSIS — Z66 Do not resuscitate: Secondary | ICD-10-CM | POA: Insufficient documentation

## 2017-12-23 DIAGNOSIS — Z87891 Personal history of nicotine dependence: Secondary | ICD-10-CM | POA: Insufficient documentation

## 2017-12-23 DIAGNOSIS — I5042 Chronic combined systolic (congestive) and diastolic (congestive) heart failure: Secondary | ICD-10-CM | POA: Diagnosis not present

## 2017-12-23 DIAGNOSIS — E1122 Type 2 diabetes mellitus with diabetic chronic kidney disease: Secondary | ICD-10-CM | POA: Insufficient documentation

## 2017-12-23 DIAGNOSIS — E785 Hyperlipidemia, unspecified: Secondary | ICD-10-CM | POA: Diagnosis not present

## 2017-12-23 DIAGNOSIS — E663 Overweight: Secondary | ICD-10-CM

## 2017-12-23 LAB — BASIC METABOLIC PANEL
ANION GAP: 10 (ref 5–15)
BUN: 30 mg/dL — ABNORMAL HIGH (ref 6–20)
CHLORIDE: 105 mmol/L (ref 101–111)
CO2: 28 mmol/L (ref 22–32)
Calcium: 8.6 mg/dL — ABNORMAL LOW (ref 8.9–10.3)
Creatinine, Ser: 1.72 mg/dL — ABNORMAL HIGH (ref 0.61–1.24)
GFR calc non Af Amer: 37 mL/min — ABNORMAL LOW (ref >60)
GFR, EST AFRICAN AMERICAN: 42 mL/min — AB (ref >60)
Glucose, Bld: 52 mg/dL — ABNORMAL LOW (ref 65–99)
Potassium: 3.7 mmol/L (ref 3.5–5.1)
Sodium: 143 mmol/L (ref 135–145)

## 2017-12-23 LAB — DIGOXIN LEVEL: Digoxin Level: 1 ng/mL (ref 0.8–2.0)

## 2017-12-23 MED ORDER — LOSARTAN POTASSIUM 25 MG PO TABS: 12.5000 mg | ORAL_TABLET | Freq: Every day | ORAL | 11 refills | Status: AC

## 2017-12-23 NOTE — Patient Instructions (Signed)
START Losartan 12.5 mg, one half tab daily  Labs today We will only contact you if something comes back abnormal or we need to make some changes. Otherwise no news is good news!  Your physician recommends that you schedule a follow-up appointment in: 4 weeks with Joanell Rising and 2 months with Dr Shirlee Latch   Do the following things EVERYDAY: 1) Weigh yourself in the morning before breakfast. Write it down and keep it in a log. 2) Take your medicines as prescribed 3) Eat low salt foods-Limit salt (sodium) to 2000 mg per day.  4) Stay as active as you can everyday 5) Limit all fluids for the day to less than 2 liters

## 2017-12-23 NOTE — Progress Notes (Signed)
Advanced Heart Failure Clinic Note   Referring Physician: PCP: [name], MD PCP-Cardiologist: [facility] - Elissa Hefty, NP  HPI:  Stanley Farmer is a 77 y.o. 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 PMH also includes CKD stage III, DM type II, hyperlipidemia, mild cognitive impairment, and remote alcohol tobacco abuse.  Admitted 2/1 - 11/27/17 with a/c systolic CHF in setting of AFib with RVR. Coox placed with concerns for low output. Initial coox 33% so placed on milrinone.  Diuresed with IV lasix and metolazone. S/p TEE DCCV 11/23/2017. Once back in NSR, pt tolerated milrinone wean.  HF meds adjusted as tolerated.   Pt not LVAD candidate with RV dysfunction, CKD III-IV, and poor social situation.   He presents today for regular follow up. At last visit, instructed to take metolazone. He is down 14 lbs since last visit. Feeling better. Not very active. No longer SOB walking around house.  Lives at home with wife. Son helps with medications and appointments. Denies CP, palpitations, dizziness or lightheadedness. He has not taken any additional metolazone.   Review of systems complete and found to be negative unless listed in HPI.    Past Medical History:  Diagnosis Date  . AKI (acute kidney injury) (HCC) 11/20/2017  . CHF (congestive heart failure) (HCC)   . CKD (chronic kidney disease)   . 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 Diabetes mellitus without complication (HCC)   . Hypertension   . 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 PAF (paroxysmal atrial fibrillation) (HCC) 11/20/2017    Current Outpatient Medications  Medication Sig Dispense Refill  . alfuzosin (UROXATRAL) 10 MG 24 hr tablet Take 10 mg by mouth daily.    Marland Kitchen amiodarone (PACERONE) 200 MG tablet Take 1 tablet (200 mg total) by mouth daily. For 1 week then 200 mg daily 30 tablet  11  . apixaban (ELIQUIS) 5 MG TABS tablet Take 1 tablet (5 mg total) by mouth 2 (two) times daily. 60 tablet 0  . atorvastatin (LIPITOR) 40 MG tablet Take 40 mg by mouth at bedtime.     . bisacodyl (DULCOLAX) 5 MG EC tablet Take 1 tablet (5 mg total) by mouth daily as needed for moderate constipation. (Patient taking differently: Take 5 mg by mouth 2 (two) times daily. ) 14 tablet 0  . digoxin (LANOXIN) 0.125 MG tablet Take 1 tablet (0.125 mg total) by mouth daily. 30 tablet 0  . famotidine (PEPCID) 20 MG tablet Take 1 tablet (20 mg total) by mouth 2 (two) times daily. 30 tablet 0  . gabapentin (NEURONTIN) 300 MG capsule Take 300 mg by mouth 3 (three) times daily.    . insulin aspart (NOVOLOG) 100 UNIT/ML injection Inject 20-32 Units into the skin 3 (three) times daily with meals. Use 20 units every morning, use 28-32 units at lunch and 28-32 units every evening    . insulin glargine (LANTUS) 100 UNIT/ML injection Inject 0.6 mLs (60 Units total) into the skin every morning. 30 mL 0  . nitroGLYCERIN (NITROLINGUAL) 0.4 MG/SPRAY spray Place 1 spray under the tongue every 5 (five) minutes x 3 doses as needed for chest pain.    . polyethylene glycol powder (GLYCOLAX/MIRALAX) powder Take 17 g by mouth 2 (two) times daily.  11  . potassium chloride SA (KLOR-CON M20) 20 MEQ tablet TAKE 40 mg TABLET BY MOUTH EVERY DAY 30 tablet 1  . torsemide (DEMADEX) 20 MG tablet  Take 4 tablets (80 mg total) by mouth 2 (two) times daily. 240 tablet 0  . metolazone (ZAROXOLYN) 2.5 MG tablet Take 1 tablet (2.5 mg total) by mouth as directed. MUST CALL CHF CLINIC FIRST BEFORE TAKING [phone] (Patient not taking: Reported on 12/23/2017) 5 tablet 0   No current facility-administered medications for this encounter.     No Known Allergies    Social History   Socioeconomic History  . Marital status: Married    Spouse name: Not on file  . Number of children: Not on file  . Years of education: Not on file  . Highest  education level: Not on file  Social Needs  . Financial resource strain: Not on file  . Food insecurity - worry: Not on file  . Food insecurity - inability: Not on file  . Transportation needs - medical: Not on file  . Transportation needs - non-medical: Not on file  Occupational History  . Not on file  Tobacco Use  . Smoking status: Former Games developer  . Smokeless tobacco: Never Used  Substance and Sexual Activity  . Alcohol use: No  . Drug use: No  . Sexual activity: Not Currently  Other Topics Concern  . Not on file  Social History Narrative  . Not on file   Family History  Problem Relation Age of Onset  . Diabetes Mother    Vitals:   12/23/17 1204  BP: (!) 118/58  Pulse: 62  SpO2: 98%  Weight: 175 lb 9.6 oz (79.7 kg)    Wt Readings from Last 3 Encounters:  12/23/17 175 lb 9.6 oz (79.7 kg)  12/05/17 190 lb 12.8 oz (86.5 kg)  11/27/17 180 lb 1.9 oz (81.7 kg)    PHYSICAL EXAM: General: Well appearing. No resp difficulty. Interpreter present HEENT: Normal Neck: Supple. JVP 6-7 cm. Carotids 2+ bilat; no bruits. No thyromegaly or nodule noted. Cor: PMI nondisplaced. RRR, No M/G/R noted Lungs: CTAB, normal effort. Abdomen: Soft, non-tender, non-distended, no HSM. No bruits or masses. +BS  Extremities: No cyanosis, clubbing, or rash. Trace ankle edema.  Neuro: Alert & orientedx3, cranial nerves grossly intact. moves all 4 extremities w/o difficulty. Affect pleasant   ASSESSMENT & PLAN:  1. Chronic systolic HF  - Unclear etiology. Long standing CMP, no h/o of cath, but nuclear stress 2011 with fixed defect and no ischemia.  Echo 07/2016 20%.Echo 11/18/17 shows EF 10%, with severe RV dysfunction.  - NYHA III symptoms. - Volume status much improved.   - Continue torsemide 80 mg BID. BMET today.  - Continue digoxin 0.125, Check level today.   - Add losartan 12.5 mg at bedtime. May not tolerated with CKD III. - BMET today - No Coreg with low output.  No ACE/ARB with AKI -  EP evaluated. CRT-D not thought to be beneficial.  - RV dysfunction, renal dysfunction, and social situation precludes VAD. No change.  - Reinforced fluid restriction to < 2 L daily, sodium restriction to less than 2000 mg daily, and the importance of daily weights.   2. Atrial fibrillation: Unclear chronicity.Likely related to his decompensation. CHADSVaScis 8. - Started Eliquis 2/1.  S/P TEE/DCCV 11/23/17. Remains in NSR by EKG.  - Continue amiodarone 200 mg daily.  3. CKD III:  - BMET today. 4. H/o CVA: With cognitive deficits.  - Continue statin. Continue eliquis. Denies bleeding.  5. Illiterate:  - Unable to read spanish or english. Interpreter present.  6. DNR - Pt is not a candidate for advanced therapies  with age, RV dysfunction, CKD, and poor social situation.  - No change.  Doing well overall. Meds and labs as above. RTC 4 weeks.   Graciella Freer, PA-C 12/23/17   Greater than 50% of the 25 minute visit was spent in counseling/coordination of care regarding disease state education, salt/fluid restriction, sliding scale diuretics, and medication compliance.

## 2018-01-06 ENCOUNTER — Ambulatory Visit (HOSPITAL_COMMUNITY)
Admission: RE | Admit: 2018-01-06 | Discharge: 2018-01-06 | Disposition: A | Discharge status: Home / Self Care | Payer: Medicare Other | Source: Ambulatory Visit | Attending: Cardiology | Admitting: Cardiology

## 2018-01-06 ENCOUNTER — Telehealth (HOSPITAL_COMMUNITY): Payer: Self-pay | Admitting: Cardiology

## 2018-01-06 ENCOUNTER — Encounter (HOSPITAL_COMMUNITY): Payer: Self-pay

## 2018-01-06 VITALS — BP 122/60 | HR 69 | Wt 174.4 lb

## 2018-01-06 DIAGNOSIS — I420 Dilated cardiomyopathy: Secondary | ICD-10-CM | POA: Diagnosis not present

## 2018-01-06 DIAGNOSIS — N183 Chronic kidney disease, stage 3 unspecified: Secondary | ICD-10-CM

## 2018-01-06 DIAGNOSIS — I5022 Chronic systolic (congestive) heart failure: Secondary | ICD-10-CM | POA: Insufficient documentation

## 2018-01-06 DIAGNOSIS — E1142 Type 2 diabetes mellitus with diabetic polyneuropathy: Secondary | ICD-10-CM | POA: Diagnosis not present

## 2018-01-06 DIAGNOSIS — Z79899 Other long term (current) drug therapy: Secondary | ICD-10-CM | POA: Insufficient documentation

## 2018-01-06 DIAGNOSIS — Z87891 Personal history of nicotine dependence: Secondary | ICD-10-CM | POA: Diagnosis not present

## 2018-01-06 DIAGNOSIS — I10 Essential (primary) hypertension: Secondary | ICD-10-CM | POA: Diagnosis not present

## 2018-01-06 DIAGNOSIS — Z794 Long term (current) use of insulin: Secondary | ICD-10-CM | POA: Diagnosis not present

## 2018-01-06 DIAGNOSIS — I5042 Chronic combined systolic (congestive) and diastolic (congestive) heart failure: Secondary | ICD-10-CM | POA: Diagnosis not present

## 2018-01-06 DIAGNOSIS — I130 Hypertensive heart and chronic kidney disease with heart failure and stage 1 through stage 4 chronic kidney disease, or unspecified chronic kidney disease: Secondary | ICD-10-CM | POA: Insufficient documentation

## 2018-01-06 DIAGNOSIS — Z66 Do not resuscitate: Secondary | ICD-10-CM | POA: Insufficient documentation

## 2018-01-06 DIAGNOSIS — Z8673 Personal history of transient ischemic attack (TIA), and cerebral infarction without residual deficits: Secondary | ICD-10-CM | POA: Diagnosis not present

## 2018-01-06 DIAGNOSIS — Z7901 Long term (current) use of anticoagulants: Secondary | ICD-10-CM | POA: Insufficient documentation

## 2018-01-06 DIAGNOSIS — I480 Paroxysmal atrial fibrillation: Secondary | ICD-10-CM

## 2018-01-06 DIAGNOSIS — Z789 Other specified health status: Secondary | ICD-10-CM | POA: Diagnosis not present

## 2018-01-06 DIAGNOSIS — E1122 Type 2 diabetes mellitus with diabetic chronic kidney disease: Secondary | ICD-10-CM | POA: Diagnosis not present

## 2018-01-06 DIAGNOSIS — Z550 Illiteracy and low-level literacy: Secondary | ICD-10-CM | POA: Insufficient documentation

## 2018-01-06 LAB — BASIC METABOLIC PANEL
ANION GAP: 9 (ref 5–15)
BUN: 24 mg/dL — ABNORMAL HIGH (ref 6–20)
CALCIUM: 8.7 mg/dL — AB (ref 8.9–10.3)
CO2: 31 mmol/L (ref 22–32)
CREATININE: 1.6 mg/dL — AB (ref 0.61–1.24)
Chloride: 102 mmol/L (ref 101–111)
GFR, EST AFRICAN AMERICAN: 46 mL/min — AB (ref >60)
GFR, EST NON AFRICAN AMERICAN: 40 mL/min — AB (ref >60)
GLUCOSE: 69 mg/dL (ref 65–99)
Potassium: 3.4 mmol/L — ABNORMAL LOW (ref 3.5–5.1)
Sodium: 142 mmol/L (ref 135–145)

## 2018-01-06 MED ORDER — DIGOXIN 62.5 MCG PO TABS: 0.0625 mg | ORAL_TABLET | Freq: Every day | ORAL | 6 refills | Status: DC

## 2018-01-06 MED ORDER — POTASSIUM CHLORIDE CRYS ER 20 MEQ PO TBCR: EXTENDED_RELEASE_TABLET | ORAL | 1 refills | Status: DC

## 2018-01-06 MED ORDER — CARVEDILOL 3.125 MG PO TABS: 3.1250 mg | ORAL_TABLET | Freq: Two times a day (BID) | ORAL | 11 refills | Status: DC

## 2018-01-06 NOTE — Telephone Encounter (Signed)
Patient aware and voice understanding

## 2018-01-06 NOTE — Patient Instructions (Signed)
START Carvedilol 3.125 mg, one tab twice a day DECREASE Digoxin to 0.0625 mg, one tab daily  Labs today We will only contact you if something comes back abnormal or we need to make some changes. Otherwise no news is good news!  Your physician recommends that you keep follow up appointment as scheduled   Do the following things EVERYDAY: 1) Weigh yourself in the morning before breakfast. Write it down and keep it in a log. 2) Take your medicines as prescribed 3) Eat low salt foods-Limit salt (sodium) to 2000 mg per day.  4) Stay as active as you can everyday 5) Limit all fluids for the day to less than 2 liters

## 2018-01-06 NOTE — Telephone Encounter (Signed)
-----   Message from Graciella Freer, PA-C sent at 01/06/2018  1:21 PM EDT ----- Please increase k to 40 meq q am and 20 meq q pm.  Thanks  Casimiro Needle "Mardelle Matte" [place], PA-C

## 2018-01-06 NOTE — Progress Notes (Signed)
Advanced Heart Failure Clinic Note   Referring Physician: PCP: [name], MD PCP-Cardiologist: [facility] - Elissa Hefty, NP  HPI:  Stanley Farmer is a 77 y.o. 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 PMH also includes CKD stage III, DM type II, hyperlipidemia, mild cognitive impairment, and remote alcohol tobacco abuse.  Admitted 2/1 - 11/27/17 with a/c systolic CHF in setting of AFib with RVR. Coox placed with concerns for low output. Initial coox 33% so placed on milrinone.  Diuresed with IV lasix and metolazone. S/p TEE DCCV 11/23/2017. Once back in NSR, pt tolerated milrinone wean.  HF meds adjusted as tolerated.   Pt not LVAD candidate with RV dysfunction, CKD III-IV, and poor social situation.   Translator present He presents today for regular follow up. Weight stable from last visit. He has not needed extra diuretics. His son and daughter are helping with his medicines. Appetite improving, but watching salt. He sleeps during the day but has trouble sleeping at night. Denies SOB walking around the house, otherwise not very active. He denies CP, lightheadedness or dizziness.   Review of systems complete and found to be negative unless listed in HPI.    Past Medical History:  Diagnosis Date  . AKI (acute kidney injury) (HCC) 11/20/2017  . CHF (congestive heart failure) (HCC)   . CKD (chronic kidney disease)   . 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 Diabetes mellitus without complication (HCC)   . Hypertension   . 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 PAF (paroxysmal atrial fibrillation) (HCC) 11/20/2017    Current Outpatient Medications  Medication Sig Dispense Refill  . alfuzosin (UROXATRAL) 10 MG 24 hr tablet Take 10 mg by mouth daily.    Marland Kitchen amiodarone (PACERONE) 200 MG tablet Take 1 tablet (200 mg total) by mouth daily. For 1 week  then 200 mg daily 30 tablet 11  . atorvastatin (LIPITOR) 40 MG tablet Take 40 mg by mouth at bedtime.     . famotidine (PEPCID) 20 MG tablet Take 1 tablet (20 mg total) by mouth 2 (two) times daily. 30 tablet 0  . losartan (COZAAR) 25 MG tablet Take 0.5 tablets (12.5 mg total) by mouth daily. 15 tablet 11  . potassium chloride SA (KLOR-CON M20) 20 MEQ tablet TAKE 40 mg TABLET BY MOUTH EVERY DAY 30 tablet 1  . torsemide (DEMADEX) 20 MG tablet Take 4 tablets (80 mg total) by mouth 2 (two) times daily. 240 tablet 0  . apixaban (ELIQUIS) 5 MG TABS tablet Take 1 tablet (5 mg total) by mouth 2 (two) times daily. 60 tablet 0  . bisacodyl (DULCOLAX) 5 MG EC tablet Take 1 tablet (5 mg total) by mouth daily as needed for moderate constipation. (Patient taking differently: Take 5 mg by mouth 2 (two) times daily. ) 14 tablet 0  . digoxin (LANOXIN) 0.125 MG tablet Take 1 tablet (0.125 mg total) by mouth daily. 30 tablet 0  . gabapentin (NEURONTIN) 300 MG capsule Take 300 mg by mouth 3 (three) times daily.    . insulin aspart (NOVOLOG) 100 UNIT/ML injection Inject 20-32 Units into the skin 3 (three) times daily with meals. Use 20 units every morning, use 28-32 units at lunch and 28-32 units every evening    . insulin glargine (LANTUS) 100 UNIT/ML injection Inject 0.6 mLs (60 Units total) into the skin every morning. 30 mL 0  . metolazone (ZAROXOLYN) 2.5 MG tablet Take  1 tablet (2.5 mg total) by mouth as directed. MUST CALL CHF CLINIC FIRST BEFORE TAKING [phone] (Patient not taking: Reported on 12/23/2017) 5 tablet 0  . nitroGLYCERIN (NITROLINGUAL) 0.4 MG/SPRAY spray Place 1 spray under the tongue every 5 (five) minutes x 3 doses as needed for chest pain.    . polyethylene glycol powder (GLYCOLAX/MIRALAX) powder Take 17 g by mouth 2 (two) times daily.  11   No current facility-administered medications for this encounter.     No Known Allergies    Social History   Socioeconomic History  . Marital status:  Married    Spouse name: Not on file  . Number of children: Not on file  . Years of education: Not on file  . Highest education level: Not on file  Occupational History  . Not on file  Social Needs  . Financial resource strain: Not on file  . Food insecurity:    Worry: Not on file    Inability: Not on file  . Transportation needs:    Medical: Not on file    Non-medical: Not on file  Tobacco Use  . Smoking status: Former Games developer  . Smokeless tobacco: Never Used  Substance and Sexual Activity  . Alcohol use: No  . Drug use: No  . Sexual activity: Not Currently  Lifestyle  . Physical activity:    Days per week: Not on file    Minutes per session: Not on file  . Stress: Not on file  Relationships  . Social connections:    Talks on phone: Not on file    Gets together: Not on file    Attends religious service: Not on file    Active member of club or organization: Not on file    Attends meetings of clubs or organizations: Not on file    Relationship status: Not on file  . Intimate partner violence:    Fear of current or ex partner: Not on file    Emotionally abused: Not on file    Physically abused: Not on file    Forced sexual activity: Not on file  Other Topics Concern  . Not on file  Social History Narrative  . Not on file   Family History  Problem Relation Age of Onset  . Diabetes Mother    Vitals:   01/06/18 1201  BP: 122/60  Pulse: 69  SpO2: 99%  Weight: 174 lb 6.4 oz (79.1 kg)    Wt Readings from Last 3 Encounters:  01/06/18 174 lb 6.4 oz (79.1 kg)  12/23/17 175 lb 9.6 oz (79.7 kg)  12/05/17 190 lb 12.8 oz (86.5 kg)    PHYSICAL EXAM: Interpreter present General: Well appearing. No resp difficulty. HEENT: Normal Neck: Supple. JVP 5-6. Carotids 2+ bilat; no bruits. No thyromegaly or nodule noted. Cor: PMI nondisplaced. RRR, No M/G/R noted Lungs: CTAB, normal effort. Abdomen: Soft, non-tender, non-distended, no HSM. No bruits or masses. +BS   Extremities: No cyanosis, clubbing, or rash. R and LLE no edema.  Neuro: Alert & orientedx3, cranial nerves grossly intact. moves all 4 extremities w/o difficulty. Affect pleasant   ASSESSMENT & PLAN:  1. Chronic systolic HF  - Unclear etiology. Long standing CMP, no h/o of cath, but nuclear stress 2011 with fixed defect and no ischemia.  Echo 07/2016 20%.Echo 11/18/17 shows EF 10%, with severe RV dysfunction.  - NYHA II-III symptoms - Volume status stable on torsemide 80 mg BID. BMET today.  - Decrease digoxin to 0.0625 mg daily. (Was  not able to get ahold of patient over phone. Now have 2 numbers).  - Continue losartan 12.5 mg at bedtime. BMET today.  - Will cautiously try coreg 3.125 mg BID.  - Consider spiro next visit, but will need close follow up with CKD.  - EP evaluated. CRT-D not thought to be beneficial.  - RV dysfunction, renal dysfunction, and social situation precludes VAD. No change.  - Reinforced fluid restriction to < 2 L daily, sodium restriction to less than 2000 mg daily, and the importance of daily weights.   2. Atrial fibrillation: Unclear chronicity.Likely related to his decompensation. CHADSVaScis 8. - Started Eliquis 2/1.  S/P TEE/DCCV 11/23/17.  - Regular on exam.  - Continue amiodarone 200 mg daily.  3. CKD III:  - BMET today.  4. H/o CVA: With cognitive deficits.  - Continue statin. Continue eliquis. No bleeding.  5. Illiterate:  - Unable to read spanish or english. Interpreter present. His son and daughter help with his medications.  6. DNR - Pt is not a candidate for advanced therapies with age, RV dysfunction, CKD, and poor social situation.  - No change.   Doing well. RTC 6 weeks with MD visit. Meds and labs as above.   Graciella Freer, PA-C 01/06/18   Greater than 50% of the 30 minute visit was spent in counseling/coordination of care regarding disease state education, salt/fluid restriction, sliding scale diuretics, and medication  compliance.

## 2018-01-11 ENCOUNTER — Encounter (HOSPITAL_COMMUNITY): Payer: Self-pay

## 2018-01-11 ENCOUNTER — Other Ambulatory Visit: Payer: Self-pay

## 2018-01-11 ENCOUNTER — Emergency Department (HOSPITAL_COMMUNITY): Payer: Medicare Other

## 2018-01-11 ENCOUNTER — Emergency Department (HOSPITAL_COMMUNITY)
Admission: EM | Admit: 2018-01-11 | Discharge: 2018-01-12 | Disposition: A | Discharge status: Home / Self Care | Payer: Medicare Other | Attending: Emergency Medicine | Admitting: Emergency Medicine

## 2018-01-11 DIAGNOSIS — J069 Acute upper respiratory infection, unspecified: Secondary | ICD-10-CM | POA: Diagnosis not present

## 2018-01-11 DIAGNOSIS — R05 Cough: Secondary | ICD-10-CM | POA: Diagnosis present

## 2018-01-11 DIAGNOSIS — R0602 Shortness of breath: Secondary | ICD-10-CM | POA: Diagnosis not present

## 2018-01-11 DIAGNOSIS — I130 Hypertensive heart and chronic kidney disease with heart failure and stage 1 through stage 4 chronic kidney disease, or unspecified chronic kidney disease: Secondary | ICD-10-CM | POA: Insufficient documentation

## 2018-01-11 DIAGNOSIS — I5042 Chronic combined systolic (congestive) and diastolic (congestive) heart failure: Secondary | ICD-10-CM | POA: Insufficient documentation

## 2018-01-11 DIAGNOSIS — E1122 Type 2 diabetes mellitus with diabetic chronic kidney disease: Secondary | ICD-10-CM | POA: Diagnosis not present

## 2018-01-11 DIAGNOSIS — N183 Chronic kidney disease, stage 3 (moderate): Secondary | ICD-10-CM | POA: Diagnosis not present

## 2018-01-11 DIAGNOSIS — Z79899 Other long term (current) drug therapy: Secondary | ICD-10-CM | POA: Insufficient documentation

## 2018-01-11 DIAGNOSIS — Z794 Long term (current) use of insulin: Secondary | ICD-10-CM | POA: Diagnosis not present

## 2018-01-11 DIAGNOSIS — Z87891 Personal history of nicotine dependence: Secondary | ICD-10-CM | POA: Insufficient documentation

## 2018-01-11 LAB — INFLUENZA PANEL BY PCR (TYPE A & B)
INFLBPCR: NEGATIVE
Influenza A By PCR: NEGATIVE

## 2018-01-11 LAB — COMPREHENSIVE METABOLIC PANEL
ALK PHOS: 81 U/L (ref 38–126)
ALT: 41 U/L (ref 17–63)
AST: 33 U/L (ref 15–41)
Albumin: 2.7 g/dL — ABNORMAL LOW (ref 3.5–5.0)
Anion gap: 9 (ref 5–15)
BILIRUBIN TOTAL: 1.2 mg/dL (ref 0.3–1.2)
BUN: 27 mg/dL — ABNORMAL HIGH (ref 6–20)
CALCIUM: 8.1 mg/dL — AB (ref 8.9–10.3)
CO2: 26 mmol/L (ref 22–32)
Chloride: 100 mmol/L — ABNORMAL LOW (ref 101–111)
Creatinine, Ser: 2.08 mg/dL — ABNORMAL HIGH (ref 0.61–1.24)
GFR calc non Af Amer: 29 mL/min — ABNORMAL LOW (ref >60)
GFR, EST AFRICAN AMERICAN: 34 mL/min — AB (ref >60)
Glucose, Bld: 73 mg/dL (ref 65–99)
Potassium: 4.3 mmol/L (ref 3.5–5.1)
SODIUM: 135 mmol/L (ref 135–145)
TOTAL PROTEIN: 5.9 g/dL — AB (ref 6.5–8.1)

## 2018-01-11 LAB — CBC WITH DIFFERENTIAL/PLATELET
BASOS ABS: 0 10*3/uL (ref 0.0–0.1)
BASOS PCT: 0 %
Eosinophils Absolute: 0 10*3/uL (ref 0.0–0.7)
Eosinophils Relative: 0 %
HEMATOCRIT: 35.6 % — AB (ref 39.0–52.0)
HEMOGLOBIN: 11.4 g/dL — AB (ref 13.0–17.0)
Lymphocytes Relative: 14 %
Lymphs Abs: 1 10*3/uL (ref 0.7–4.0)
MCH: 28.8 pg (ref 26.0–34.0)
MCHC: 32 g/dL (ref 30.0–36.0)
MCV: 89.9 fL (ref 78.0–100.0)
Monocytes Absolute: 0.4 10*3/uL (ref 0.1–1.0)
Monocytes Relative: 6 %
NEUTROS ABS: 5.3 10*3/uL (ref 1.7–7.7)
NEUTROS PCT: 80 %
Platelets: 137 10*3/uL — ABNORMAL LOW (ref 150–400)
RBC: 3.96 MIL/uL — AB (ref 4.22–5.81)
RDW: 17.8 % — ABNORMAL HIGH (ref 11.5–15.5)
WBC: 6.7 10*3/uL (ref 4.0–10.5)

## 2018-01-11 LAB — URINALYSIS, ROUTINE W REFLEX MICROSCOPIC
BACTERIA UA: NONE SEEN
Bilirubin Urine: NEGATIVE
GLUCOSE, UA: NEGATIVE mg/dL
Ketones, ur: NEGATIVE mg/dL
LEUKOCYTES UA: NEGATIVE
Nitrite: NEGATIVE
PROTEIN: NEGATIVE mg/dL
Specific Gravity, Urine: 1.01 (ref 1.005–1.030)
Squamous Epithelial / LPF: NONE SEEN
pH: 6 (ref 5.0–8.0)

## 2018-01-11 LAB — DIGOXIN LEVEL: Digoxin Level: <0.2 ng/mL — ABNORMAL LOW (ref 0.8–2.0)

## 2018-01-11 LAB — I-STAT TROPONIN, ED: Troponin i, poc: 0.07 ng/mL (ref 0.00–0.08)

## 2018-01-11 LAB — BRAIN NATRIURETIC PEPTIDE: B NATRIURETIC PEPTIDE 5: 1432 pg/mL — AB (ref 0.0–100.0)

## 2018-01-11 LAB — I-STAT CG4 LACTIC ACID, ED: Lactic Acid, Venous: 1.49 mmol/L (ref 0.5–1.9)

## 2018-01-11 MED ORDER — SODIUM CHLORIDE 0.9 % IV BOLUS
250.0000 mL | Freq: Once | INTRAVENOUS | Status: AC
  Administered 2018-01-11: 250 mL via INTRAVENOUS

## 2018-01-11 NOTE — ED Notes (Signed)
Pt given food but refused to eat anything

## 2018-01-11 NOTE — ED Triage Notes (Addendum)
Patient sent from [facility] for further evaluation of low sats and low BP. Patient has had cough x 4 days with low grade fever. Alert and oriented, no pain on arrival. Patient pale and appears weak on assessment.

## 2018-01-11 NOTE — Discharge Instructions (Signed)
There are no signs of serious problems associated with the cough.  You probably have a virus that will improve in the next few days.  Make sure that you are trying to drink, your usual fluid, and try to eat 3 meals a day.  For fever you can take Tylenol every 4 hours.  We recommend using a product like Robitussin-DM for cough and congestion.  When you see your doctor next week have them check the test results from today.  Your digoxin level was low and

## 2018-01-11 NOTE — ED Provider Notes (Signed)
[facility] EMERGENCY DEPARTMENT Provider Note   CSN: [id number] Arrival date & time: 01/11/18  1406     History   Chief Complaint Chief Complaint  Patient presents with  . weakness/SOB    HPI Stanley Farmer is a 77 y.o. male.  He presents for evaluation of new weakness, associated with cough, fever, and chest congestion, for 2 days.  He saw his PCP today who was concerned about low oxygen level and hypotension so as family members to bring him here for evaluation.  He is taking his usual medications without relief.  He is not a smoker.  He does not have chronic lung disease.  He was eating well and able to walk yesterday.  Today he was too weak to walk using his walker.  There is been no nausea, vomiting or diarrhea.  The patient is unable to give any history.  Initial history per family member, daughter who helps to translate.  See additional HPI information below at time of discharge.  HPI  Past Medical History:  Diagnosis Date  . AKI (acute kidney injury) (HCC) 11/20/2017  . CHF (congestive heart failure) (HCC)   . CKD (chronic kidney disease)   . 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 Diabetes mellitus without complication (HCC)   . Hypertension   . 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 PAF (paroxysmal atrial fibrillation) (HCC) 11/20/2017    Patient Active Problem List   Diagnosis Date Noted  . 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 PAF (paroxysmal atrial fibrillation) (HCC) 11/20/2017  . AKI (acute kidney injury) (HCC) 11/20/2017  . Cardiogenic shock (HCC)   . BPH (benign prostatic hyperplasia) 11/18/2017  . Abdominal distension   . Type 2 diabetes mellitus with complication (HCC)   . CKD (chronic kidney disease)   . Abdominal bloating   . 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 Chronic combined systolic and diastolic CHF (congestive heart failure) (HCC) 04/21/2014  . Acute respiratory  failure (HCC) 04/21/2014  . Essential hypertension 04/21/2014  . IDDM (insulin dependent diabetes mellitus) (HCC) 04/21/2014  . Non-English speaking patient 04/21/2014  . 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 Overweight 04/21/2014    Past Surgical History:  Procedure Laterality Date  . CARDIOVERSION N/A 11/22/2017   Procedure: CARDIOVERSION;  Surgeon: Dolores Patty, MD;  Location: [facility] ENDOSCOPY;  Service: Cardiovascular;  Laterality: N/A;  . KIDNEY STONE SURGERY    . TEE WITHOUT CARDIOVERSION N/A 11/22/2017   Procedure: TRANSESOPHAGEAL ECHOCARDIOGRAM (TEE);  Surgeon: Dolores Patty, MD;  Location: [facility] ENDOSCOPY;  Service: Cardiovascular;  Laterality: N/A;        Home Medications    Prior to Admission medications   Medication Sig Start Date End Date Taking? Authorizing Provider  alfuzosin (UROXATRAL) 10 MG 24 hr tablet Take 10 mg by mouth daily.   Yes [provider]  amiodarone (PACERONE) 200 MG tablet Take 1 tablet (200 mg total) by mouth daily. For 1 week then 200 mg daily Patient taking differently: Take 200 mg by mouth daily.  12/05/17  Yes Graciella Freer, PA-C  atorvastatin (LIPITOR) 40 MG tablet Take 40 mg by mouth at bedtime.    Yes [provider]  carvedilol (COREG) 3.125 MG tablet Take 1 tablet (3.125 mg total) by mouth 2 (two) times daily. 01/06/18 01/06/19 Yes TilleryMariam Dollar, PA-C  famotidine (PEPCID) 20 MG tablet Take 1 tablet (20 mg total) by mouth 2 (two) times daily. 08/03/15  Yes Rolan Bucco, MD  gabapentin (NEURONTIN) 300 MG capsule Take 300 mg by mouth 3 (three) times daily.   Yes [provider]  insulin aspart (NOVOLOG) 100 UNIT/ML injection Inject 10-26 Units into the skin See admin instructions. Inject 10-26 units into the skin three times a day before meals, per sliding scale   Yes [provider]  insulin glargine (LANTUS) 100 UNIT/ML injection Inject 0.6 mLs (60 Units  total) into the skin every morning. Patient taking differently: Inject 70 Units into the skin daily before breakfast.  11/27/17  Yes Randel Pigg, Dorma Russell, MD  losartan (COZAAR) 25 MG tablet Take 0.5 tablets (12.5 mg total) by mouth daily. 12/23/17 12/23/18 Yes Tillery, Mariam Dollar, PA-C  nitroGLYCERIN (NITROLINGUAL) 0.4 MG/SPRAY spray Place 1 spray under the tongue every 5 (five) minutes x 3 doses as needed for chest pain.   Yes [provider]  polyethylene glycol (MIRALAX / GLYCOLAX) packet Take 17 g by mouth 2 (two) times daily. 12/20/17  Yes [provider]  polyethylene glycol powder (GLYCOLAX/MIRALAX) powder Take 17 g by mouth 2 (two) times daily. 09/15/17  Yes [provider]  potassium chloride SA (KLOR-CON M20) 20 MEQ tablet Take 2 tablets (40 mEq total) by mouth every morning AND 1 tablet (20 mEq total) every evening. 01/06/18  Yes Tillery, Mariam Dollar, PA-C  torsemide (DEMADEX) 20 MG tablet Take 4 tablets (80 mg total) by mouth 2 (two) times daily. 11/27/17  Yes Randel Pigg, Dorma Russell, MD  apixaban (ELIQUIS) 5 MG TABS tablet Take 1 tablet (5 mg total) by mouth 2 (two) times daily. Patient not taking: Reported on 01/11/2018 11/27/17   Randel Pigg, Dorma Russell, MD  bisacodyl (DULCOLAX) 5 MG EC tablet Take 1 tablet (5 mg total) by mouth daily as needed for moderate constipation. Patient not taking: Reported on 01/11/2018 04/28/14   Trixie Dredge, PA-C  digoxin 62.5 MCG TABS Take 0.0625 mg by mouth daily. 01/06/18   Graciella Freer, PA-C  metolazone (ZAROXOLYN) 2.5 MG tablet Take 1 tablet (2.5 mg total) by mouth as directed. MUST CALL CHF CLINIC FIRST BEFORE TAKING [phone] 12/05/17 03/05/18  Graciella Freer, PA-C    Family History Family History  Problem Relation Age of Onset  . Diabetes Mother     Social History Social History   Tobacco Use  . Smoking status: Former Games developer  . Smokeless tobacco: Never Used  Substance Use Topics  . Alcohol use: No  .  Drug use: No     Allergies   Patient has no known allergies.   Review of Systems Review of Systems  Unable to perform ROS: Other     Physical Exam Updated Vital Signs BP 105/71 (BP Location: Right Arm)   Pulse 76   Temp 99.4 F (37.4 C) (Rectal)   Resp 18   SpO2 96%   Physical Exam  Constitutional: He is oriented to person, place, and time. He appears well-developed.  Elderly, frail  HENT:  Head: Normocephalic and atraumatic.  Right Ear: External ear normal.  Left Ear: External ear normal.  Eyes: Pupils are equal, round, and reactive to light. Conjunctivae and EOM are normal.  Neck: Normal range of motion and phonation normal. Neck supple.  Cardiovascular: Normal rate, regular rhythm and normal heart sounds.  Pulmonary/Chest: Effort normal. No respiratory distress. He exhibits no bony tenderness.  Decreased air movement, bilaterally bases to mid chest, with scattered rhonchi but no wheezes or rales.  Abdominal: Soft. He exhibits no mass. There is no  tenderness. There is no guarding.  Musculoskeletal: Normal range of motion. He exhibits no edema or deformity.  Neurological: He is alert and oriented to person, place, and time. No cranial nerve deficit or sensory deficit. He exhibits normal muscle tone. Coordination normal.  Skin: Skin is warm, dry and intact.  Psychiatric: He has a normal mood and affect. His behavior is normal.  Nursing note and vitals reviewed.    ED Treatments / Results  Labs (all labs ordered are listed, but only abnormal results are displayed) Labs Reviewed  COMPREHENSIVE METABOLIC PANEL - Abnormal; Notable for the following components:      Result Value   Chloride 100 (*)    BUN 27 (*)    Creatinine, Ser 2.08 (*)    Calcium 8.1 (*)    Total Protein 5.9 (*)    Albumin 2.7 (*)    GFR calc non Af Amer 29 (*)    GFR calc Af Amer 34 (*)    All other components within normal limits  CBC WITH DIFFERENTIAL/PLATELET - Abnormal; Notable for the  following components:   RBC 3.96 (*)    Hemoglobin 11.4 (*)    HCT 35.6 (*)    RDW 17.8 (*)    Platelets 137 (*)    All other components within normal limits  URINALYSIS, ROUTINE W REFLEX MICROSCOPIC - Abnormal; Notable for the following components:   Hgb urine dipstick SMALL (*)    All other components within normal limits  DIGOXIN LEVEL - Abnormal; Notable for the following components:   Digoxin Level <0.2 (*)    All other components within normal limits  BRAIN NATRIURETIC PEPTIDE - Abnormal; Notable for the following components:   B Natriuretic Peptide 1,432.0 (*)    All other components within normal limits  INFLUENZA PANEL BY PCR (TYPE A & B)  I-STAT CG4 LACTIC ACID, ED  I-STAT TROPONIN, ED    EKG EKG Interpretation  Date/Time:  Wednesday January 11 2018 14:12:20 EDT Ventricular Rate:  78 PR Interval:  210 QRS Duration: 158 QT Interval:  480 QTC Calculation: 547 R Axis:   -128 Text Interpretation:  Sinus rhythm with 1st degree A-V block Right superior axis deviation Left bundle branch block Abnormal ECG since last tracing no significant change Confirmed by Mancel Bale [id number]) on 01/11/2018 4:43:00 PM   Radiology Dg Chest 2 View  Result Date: 01/11/2018 CLINICAL DATA:  Weakness EXAM: CHEST - 2 VIEW COMPARISON:  11/19/2017 FINDINGS: Cardiac shadow remains enlarged. The lungs are well aerated bilaterally. No focal infiltrate or sizable effusion is seen. No acute bony abnormality is noted. IMPRESSION: No active cardiopulmonary disease. Electronically Signed   By: Alcide Clever M.D.   On: 01/11/2018 14:45    Procedures Procedures (including critical care time)  Medications Ordered in ED Medications  sodium chloride 0.9 % bolus 250 mL (0 mLs Intravenous Stopped 01/11/18 1841)     Initial Impression / Assessment and Plan / ED Course  I have reviewed the triage vital signs and the nursing notes.  Pertinent labs & imaging results that were available during my care of the  patient were reviewed by me and considered in my medical decision making (see chart for details).  Clinical Course as of Jan 12 1920  Wed Jan 11, 2018  1644 Initial evaluation consistent with unspecified respiratory disorder, likely influenza-like syndrome.  He does not appear overtly fluid overloaded.   [EW]  1700 While standing the patient's blood pressure was 72 systolic, while supine now  it is 95 systolic.  Chest x-ray clear, will give IV fluid bolus, and reassess blood pressure.   [EW]  1705 Based on available criteria at this time, SIRS criteria are negative.  Only positive is respiratory rate.   [EW]  1706 Normal except hemoglobin low 11.4 but stable over the last month, and platelets lower than 1 month ago at 137.  CBC with Differential(!) [EW]  1706 Normal, doubt infection  I-Stat CG4 Lactic Acid, ED [EW]  1706 Normal  I-stat troponin, ED [EW]  1707 Normal except for evidence of renal insufficiency, mild AK I, creatinine 2.1, and GFR lower than baseline 1 month ago.  Patient being treated with IV fluid bolus at this time.  Comprehensive metabolic panel(!) [EW]    Clinical Course User Index [EW] Mancel Bale, MD     Patient Vitals for the past 24 hrs:  BP Temp Temp src Pulse Resp SpO2  01/11/18 1831 105/71 - - 76 18 96 %  01/11/18 1730 92/64 - - 73 - 95 %  01/11/18 1715 94/64 - - 73 - 95 %  01/11/18 1700 94/61 - - 72 - 95 %  01/11/18 1645 95/67 - - 75 - 96 %  01/11/18 1634 - 99.4 F (37.4 C) Rectal - - -  01/11/18 1615 98/64 - - 71 (!) 24 95 %  01/11/18 1606 98/61 - - 73 (!) 21 94 %  01/11/18 1453 (!) 85/45 - - 76 14 98 %  01/11/18 1412 (!) 92/32 99.5 F (37.5 C) Oral 77 20 95 %    7:37 PM Reevaluation with update and discussion. After initial assessment and treatment, an updated evaluation reveals patient appears somewhat more comfortable at this time.  Patient interviewed with translator, tele-video, and presence of his wife and daughter.  All contributed to the  discussion.  To clarify the history the patient related that he had had cough with shortness of breath for 3 days, and the rapid breathing makes his stomach hurt.  He was eating well 2 days ago but yesterday stopped eating as much.  He has been occasionally producing sputum but mostly does not produce sputum when he coughs.  He has not had any fever.  He denies chest pain, focal weakness or paresthesia.  All findings were discussed with patient and family members, and questions about the interpretation of testing were answered.  We discussed the discharge plan and recommended treatment and the patient and his family members were in agreement. Mancel Bale    Medical decision making-nonspecific cough, initially with low blood pressure, improved with fluid bolus and observation.  Discharge vital signs normal.  Oxygen saturation normal on room air.  Screening for pneumonia acute congestive heart failure, and metabolic instability all negative.  Serum digoxin level is low.  BNP is high, but near baseline.  Doubt sepsis, acute bacterial infection or impending vascular collapse.  Nursing Notes Reviewed/ Care Coordinated Applicable Imaging Reviewed Interpretation of Laboratory Data incorporated into ED treatment  The patient appears reasonably screened and/or stabilized for discharge and I doubt any other medical condition or other [facility] requiring further screening, evaluation, or treatment in the ED at this time prior to discharge.  Plan: Home Medications-continue usual medicines, use Robitussin-DM for cough and congestion; Home Treatments-rest, fluids, advance diet as soon as possible; return here if the recommended treatment, does not improve the symptoms; Recommended follow up-PCP follow-up 1 week for checkup.      Final Clinical Impressions(s) / ED Diagnoses   Final diagnoses:  None    ED Discharge Orders    None       Mancel Bale, MD 01/11/18 1943

## 2018-01-29 ENCOUNTER — Other Ambulatory Visit (HOSPITAL_COMMUNITY): Payer: Self-pay | Admitting: Student

## 2018-02-17 ENCOUNTER — Ambulatory Visit (HOSPITAL_COMMUNITY)
Admission: RE | Admit: 2018-02-17 | Discharge: 2018-02-17 | Disposition: A | Discharge status: Home / Self Care | Payer: Medicare Other | Source: Ambulatory Visit | Attending: Cardiology | Admitting: Cardiology

## 2018-02-17 ENCOUNTER — Other Ambulatory Visit: Payer: Self-pay

## 2018-02-17 ENCOUNTER — Encounter (HOSPITAL_COMMUNITY): Payer: Self-pay | Admitting: Cardiology

## 2018-02-17 VITALS — BP 103/50 | HR 69 | Wt 178.5 lb

## 2018-02-17 DIAGNOSIS — I69319 Unspecified symptoms and signs involving cognitive functions following cerebral infarction: Secondary | ICD-10-CM | POA: Diagnosis not present

## 2018-02-17 DIAGNOSIS — I447 Left bundle-branch block, unspecified: Secondary | ICD-10-CM | POA: Diagnosis not present

## 2018-02-17 DIAGNOSIS — I2510 Atherosclerotic heart disease of native coronary artery without angina pectoris: Secondary | ICD-10-CM | POA: Diagnosis not present

## 2018-02-17 DIAGNOSIS — Z833 Family history of diabetes mellitus: Secondary | ICD-10-CM | POA: Diagnosis not present

## 2018-02-17 DIAGNOSIS — Z87891 Personal history of nicotine dependence: Secondary | ICD-10-CM | POA: Insufficient documentation

## 2018-02-17 DIAGNOSIS — I429 Cardiomyopathy, unspecified: Secondary | ICD-10-CM | POA: Diagnosis not present

## 2018-02-17 DIAGNOSIS — Z794 Long term (current) use of insulin: Secondary | ICD-10-CM | POA: Diagnosis not present

## 2018-02-17 DIAGNOSIS — Z550 Illiteracy and low-level literacy: Secondary | ICD-10-CM | POA: Insufficient documentation

## 2018-02-17 DIAGNOSIS — E1122 Type 2 diabetes mellitus with diabetic chronic kidney disease: Secondary | ICD-10-CM | POA: Insufficient documentation

## 2018-02-17 DIAGNOSIS — E785 Hyperlipidemia, unspecified: Secondary | ICD-10-CM | POA: Diagnosis not present

## 2018-02-17 DIAGNOSIS — I480 Paroxysmal atrial fibrillation: Secondary | ICD-10-CM | POA: Diagnosis not present

## 2018-02-17 DIAGNOSIS — I5022 Chronic systolic (congestive) heart failure: Secondary | ICD-10-CM | POA: Insufficient documentation

## 2018-02-17 DIAGNOSIS — I5042 Chronic combined systolic (congestive) and diastolic (congestive) heart failure: Secondary | ICD-10-CM | POA: Diagnosis not present

## 2018-02-17 DIAGNOSIS — Z79899 Other long term (current) drug therapy: Secondary | ICD-10-CM | POA: Insufficient documentation

## 2018-02-17 DIAGNOSIS — Z66 Do not resuscitate: Secondary | ICD-10-CM | POA: Diagnosis not present

## 2018-02-17 DIAGNOSIS — N183 Chronic kidney disease, stage 3 unspecified: Secondary | ICD-10-CM

## 2018-02-17 LAB — COMPREHENSIVE METABOLIC PANEL
ALK PHOS: 62 U/L (ref 38–126)
ALT: 18 U/L (ref 17–63)
ANION GAP: 8 (ref 5–15)
AST: 30 U/L (ref 15–41)
Albumin: 3.3 g/dL — ABNORMAL LOW (ref 3.5–5.0)
BILIRUBIN TOTAL: 1.3 mg/dL — AB (ref 0.3–1.2)
BUN: 16 mg/dL (ref 6–20)
CALCIUM: 8.6 mg/dL — AB (ref 8.9–10.3)
CO2: 32 mmol/L (ref 22–32)
Chloride: 102 mmol/L (ref 101–111)
Creatinine, Ser: 1.55 mg/dL — ABNORMAL HIGH (ref 0.61–1.24)
GFR calc Af Amer: 48 mL/min — ABNORMAL LOW (ref >60)
GFR, EST NON AFRICAN AMERICAN: 41 mL/min — AB (ref >60)
GLUCOSE: 92 mg/dL (ref 65–99)
Potassium: 3.6 mmol/L (ref 3.5–5.1)
Sodium: 142 mmol/L (ref 135–145)
TOTAL PROTEIN: 6.9 g/dL (ref 6.5–8.1)

## 2018-02-17 LAB — DIGOXIN LEVEL: DIGOXIN LVL: 0.3 ng/mL — AB (ref 0.8–2.0)

## 2018-02-17 LAB — CBC
HCT: 37.8 % — ABNORMAL LOW (ref 39.0–52.0)
Hemoglobin: 12.4 g/dL — ABNORMAL LOW (ref 13.0–17.0)
MCH: 29.3 pg (ref 26.0–34.0)
MCHC: 32.8 g/dL (ref 30.0–36.0)
MCV: 89.4 fL (ref 78.0–100.0)
PLATELETS: 139 10*3/uL — AB (ref 150–400)
RBC: 4.23 MIL/uL (ref 4.22–5.81)
RDW: 17.7 % — ABNORMAL HIGH (ref 11.5–15.5)
WBC: 6.9 10*3/uL (ref 4.0–10.5)

## 2018-02-17 LAB — TSH: TSH: 6.49 u[IU]/mL — ABNORMAL HIGH (ref 0.350–4.500)

## 2018-02-17 MED ORDER — APIXABAN 5 MG PO TABS: 5.0000 mg | ORAL_TABLET | Freq: Two times a day (BID) | ORAL | 3 refills | Status: DC

## 2018-02-17 MED ORDER — TORSEMIDE 20 MG PO TABS: 100.0000 mg | ORAL_TABLET | Freq: Two times a day (BID) | ORAL | 3 refills | Status: DC

## 2018-02-17 MED ORDER — SPIRONOLACTONE 25 MG PO TABS: 12.5000 mg | ORAL_TABLET | Freq: Every day | ORAL | 3 refills | Status: AC

## 2018-02-17 NOTE — Patient Instructions (Addendum)
   Increase Torsemide to 100mg  twice daily.  Restart Eliquis 5mg  twice daily.  Start Spironolactone 12.5mg  daily.  Routine lab work today. Will notify you of abnormal results  Repeat lab work in 10 days (bmet)  Follow up in 1 month.

## 2018-02-19 NOTE — Progress Notes (Signed)
Advanced Heart Failure Clinic Note   Referring Physician: PCP: [name], MD PCP-Cardiologist: [employer] - Elissa Hefty, NP  HPI:  Stanley Farmer is a 77 y.o. 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 PMH also includes CKD stage III, DM type II, hyperlipidemia, mild cognitive impairment, and remote alcohol tobacco abuse.  Admitted 2/1 - 11/27/17 with acute on chronic systolic CHF in setting of AFib with RVR. Initial coox 33% so placed on milrinone.  Diuresed with IV lasix and metolazone. S/p TEE-guided DCCV 11/23/2017. Once back in NSR, pt tolerated milrinone wean.  HF meds adjusted as tolerated.   Pt not LVAD candidate with RV dysfunction, CKD III-IV, and poor social situation.   He returns today for followup of CAD, paroxysmal atrial fibrillation, and CKD.  There is a Nurse, learning disability present.  He is living with his wife though comes with son today.  He walks with a walker at home, uses wheelchair for longer distances.  He is short of breath walking with his walker.  No orthopnea/PND.  No chest pain. No lightheadedness.  Poor appetite.   ECG (personally reviewed): NSR, LBBB (208 msec)  Labs (3/19): BNP 1432, digoxin < 0.2, K 4.3, creatinine 2.08, LFTs normal  Review of systems complete and found to be negative unless listed in HPI.    PMH: 1. Type II diabetes 2. Atrial fibrillation: Paroxysmal.  S/p TEE-guided DCCV 2/19.  3. H/o CVA 4. CKD stage 3 5. 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 Low EF x years.   - Cardiolite in 2011 with fixed defect, no ischemia.  - Echo (2/19): EF 10%, severe LV dilation, moderate MR, RV moderately dilated with decreased systolic function, PASP 76 mmHg.  6. LBBB 7. Mild cognitive impairment.    Current Outpatient Medications  Medication Sig Dispense Refill  . alfuzosin (UROXATRAL) 10 MG 24 hr tablet Take 10 mg by mouth  daily.    Marland Kitchen amiodarone (PACERONE) 200 MG tablet Take 1 tablet (200 mg total) by mouth daily. For 1 week then 200 mg daily 30 tablet 11  . atorvastatin (LIPITOR) 40 MG tablet Take 40 mg by mouth at bedtime.     . carvedilol (COREG) 3.125 MG tablet Take 1 tablet (3.125 mg total) by mouth 2 (two) times daily. 60 tablet 11  . digoxin 62.5 MCG TABS Take 0.0625 mg by mouth daily. 30 tablet 6  . famotidine (PEPCID) 20 MG tablet Take 1 tablet (20 mg total) by mouth 2 (two) times daily. 30 tablet 0  . gabapentin (NEURONTIN) 300 MG capsule Take 300 mg by mouth 3 (three) times daily.    . insulin aspart (NOVOLOG) 100 UNIT/ML injection Inject 10-26 Units into the skin See admin instructions. Inject 10-26 units into the skin three times a day before meals, per sliding scale    . insulin glargine (LANTUS) 100 UNIT/ML injection Inject 0.6 mLs (60 Units total) into the skin every morning. (Patient taking differently: Inject 70 Units into the skin daily before breakfast. ) 30 mL 0  . KLOR-CON M20 20 MEQ tablet TAKE 2 TABLETS (40 MEQ TOTAL) BY MOUTH EVERY MORNING AND 1 TABLET (20 MEQ TOTAL) EVERY EVENING. 90 tablet 0  . losartan (COZAAR) 25 MG tablet Take 0.5 tablets (12.5 mg total) by mouth daily. 15 tablet 11  . metolazone (ZAROXOLYN) 2.5 MG tablet Take 1 tablet (2.5 mg total) by mouth as directed. MUST CALL CHF CLINIC FIRST BEFORE TAKING  [phone] 5 tablet 0  . nitroGLYCERIN (NITROLINGUAL) 0.4 MG/SPRAY spray Place 1 spray under the tongue every 5 (five) minutes x 3 doses as needed for chest pain.    . polyethylene glycol (MIRALAX / GLYCOLAX) packet Take 17 g by mouth 2 (two) times daily.  11  . polyethylene glycol powder (GLYCOLAX/MIRALAX) powder Take 17 g by mouth 2 (two) times daily.  11  . torsemide (DEMADEX) 20 MG tablet Take 5 tablets (100 mg total) by mouth 2 (two) times daily. 300 tablet 3  . apixaban (ELIQUIS) 5 MG TABS tablet Take 1 tablet (5 mg total) by mouth 2 (two) times daily. 60 tablet 3  .  spironolactone (ALDACTONE) 25 MG tablet Take 0.5 tablets (12.5 mg total) by mouth daily. 45 tablet 3   No current facility-administered medications for this encounter.     No Known Allergies    Social History   Socioeconomic History  . Marital status: Married    Spouse name: Not on file  . Number of children: Not on file  . Years of education: Not on file  . Highest education level: Not on file  Occupational History  . Not on file  Social Needs  . Financial resource strain: Not on file  . Food insecurity:    Worry: Not on file    Inability: Not on file  . Transportation needs:    Medical: Not on file    Non-medical: Not on file  Tobacco Use  . Smoking status: Former Games developer  . Smokeless tobacco: Never Used  Substance and Sexual Activity  . Alcohol use: No  . Drug use: No  . Sexual activity: Not Currently  Lifestyle  . Physical activity:    Days per week: Not on file    Minutes per session: Not on file  . Stress: Not on file  Relationships  . Social connections:    Talks on phone: Not on file    Gets together: Not on file    Attends religious service: Not on file    Active member of club or organization: Not on file    Attends meetings of clubs or organizations: Not on file    Relationship status: Not on file  . Intimate partner violence:    Fear of current or ex partner: Not on file    Emotionally abused: Not on file    Physically abused: Not on file    Forced sexual activity: Not on file  Other Topics Concern  . Not on file  Social History Narrative  . Not on file   Family History  Problem Relation Age of Onset  . Diabetes Mother    Vitals:   02/17/18 1217  BP: (!) 103/50  Pulse: 69  SpO2: 98%  Weight: 178 lb 8 oz (81 kg)    Wt Readings from Last 3 Encounters:  02/17/18 178 lb 8 oz (81 kg)  01/06/18 174 lb 6.4 oz (79.1 kg)  12/23/17 175 lb 9.6 oz (79.7 kg)    PHYSICAL EXAM: Interpreter present General: NAD Neck: JVP 8-9 cm, no thyromegaly or  thyroid nodule.  Lungs: Clear to auscultation bilaterally with normal respiratory effort. CV: Nondisplaced PMI.  Heart regular S1/S2, no S3/S4, no murmur.  2+ edema 1/2 to knees bilaterally.  No carotid bruit.  Normal pedal pulses.  Abdomen: Soft, nontender, no hepatosplenomegaly, mild abdominal distention.  Skin: Intact without lesions or rashes.  Neurologic: Alert and oriented x 3.  Psych: Normal affect. Extremities: No clubbing or cyanosis.  HEENT: Normal.   ASSESSMENT & PLAN:  1. Chronic systolic HF: Unclear etiology. Long standing CMP, no h/o of cath, but nuclear stress 2011 with fixed defect and no ischemia.  Echo 07/2016 20%.Echo 11/18/17 shows EF 10% with decreased RV function.  NYHA class III symptoms, chronic. Mild volume overload on exam today.  - Increase torsemide to 100 mg bid. BMET today and again in 10 days.  - Continue digoxin 0.0625 daily, check level today. - Continue low dose Coreg and losartan.  - Start spironolactone 12.5 mg daily.   - Seen by EP to evaluate for CRT, they recommended against this based on his overall situation.  - RV dysfunction, renal dysfunction, advanced age, and social situation precludes VAD.  2. Atrial fibrillation: Unclear chronicity.Likely related to his decompensation. CHADSVaScis 8. He is in NSR today s/p TEE-DCCV in 2/19.  - Continue amiodarone, check LFTs, TSH today.  He will need a regular eye exam.  - He is not on Eliquis but is not sure why.  No overt bleeding.  I will arrange for him to restart Eliquis 5 mg bid.  CBC today.   3. CKD III: BMET today.  4. H/o CVA: With cognitive deficits.  - Restart Eliquis as above.   5. Illiterate: Unable to read [place] or [place]. Interpreter present. His son and daughter help with his medications.  6. DNR: Pt is not a candidate for advanced therapies with age, RV dysfunction, CKD, and poor social situation.  - No change.   Followup in 1 month with NP/PA.   Marca Ancona 02/19/2018

## 2018-02-23 ENCOUNTER — Other Ambulatory Visit (HOSPITAL_COMMUNITY): Payer: Self-pay

## 2018-02-23 DIAGNOSIS — R7989 Other specified abnormal findings of blood chemistry: Secondary | ICD-10-CM

## 2018-03-03 ENCOUNTER — Ambulatory Visit (HOSPITAL_COMMUNITY)
Admission: RE | Admit: 2018-03-03 | Discharge: 2018-03-03 | Disposition: A | Discharge status: Home / Self Care | Payer: Medicare Other | Source: Ambulatory Visit | Attending: Internal Medicine | Admitting: Internal Medicine

## 2018-03-03 DIAGNOSIS — R7989 Other specified abnormal findings of blood chemistry: Secondary | ICD-10-CM

## 2018-03-03 DIAGNOSIS — I5042 Chronic combined systolic (congestive) and diastolic (congestive) heart failure: Secondary | ICD-10-CM | POA: Insufficient documentation

## 2018-03-03 LAB — BASIC METABOLIC PANEL
ANION GAP: 7 (ref 5–15)
BUN: 18 mg/dL (ref 6–20)
CALCIUM: 9 mg/dL (ref 8.9–10.3)
CO2: 32 mmol/L (ref 22–32)
CREATININE: 1.93 mg/dL — AB (ref 0.61–1.24)
Chloride: 101 mmol/L (ref 101–111)
GFR, EST AFRICAN AMERICAN: 37 mL/min — AB (ref >60)
GFR, EST NON AFRICAN AMERICAN: 32 mL/min — AB (ref >60)
GLUCOSE: 123 mg/dL — AB (ref 65–99)
Potassium: 4.2 mmol/L (ref 3.5–5.1)
Sodium: 140 mmol/L (ref 135–145)

## 2018-03-03 LAB — T4, FREE: FREE T4: 1.2 ng/dL (ref 0.82–1.77)

## 2018-03-04 LAB — T3, FREE: T3 FREE: 2.4 pg/mL (ref 2.0–4.4)

## 2018-03-05 ENCOUNTER — Other Ambulatory Visit (HOSPITAL_COMMUNITY): Payer: Self-pay | Admitting: Cardiology

## 2018-03-24 ENCOUNTER — Ambulatory Visit (HOSPITAL_COMMUNITY)
Admission: RE | Admit: 2018-03-24 | Discharge: 2018-03-24 | Disposition: A | Discharge status: Home / Self Care | Payer: Medicare Other | Source: Ambulatory Visit | Attending: Cardiology | Admitting: Cardiology

## 2018-03-24 VITALS — BP 120/64 | HR 73 | Wt 180.0 lb

## 2018-03-24 DIAGNOSIS — I429 Cardiomyopathy, unspecified: Secondary | ICD-10-CM | POA: Insufficient documentation

## 2018-03-24 DIAGNOSIS — Z7901 Long term (current) use of anticoagulants: Secondary | ICD-10-CM | POA: Diagnosis not present

## 2018-03-24 DIAGNOSIS — Z79899 Other long term (current) drug therapy: Secondary | ICD-10-CM | POA: Insufficient documentation

## 2018-03-24 DIAGNOSIS — I10 Essential (primary) hypertension: Secondary | ICD-10-CM

## 2018-03-24 DIAGNOSIS — I5022 Chronic systolic (congestive) heart failure: Secondary | ICD-10-CM | POA: Insufficient documentation

## 2018-03-24 DIAGNOSIS — I480 Paroxysmal atrial fibrillation: Secondary | ICD-10-CM

## 2018-03-24 DIAGNOSIS — Z87891 Personal history of nicotine dependence: Secondary | ICD-10-CM | POA: Insufficient documentation

## 2018-03-24 DIAGNOSIS — N183 Chronic kidney disease, stage 3 unspecified: Secondary | ICD-10-CM

## 2018-03-24 DIAGNOSIS — Z794 Long term (current) use of insulin: Secondary | ICD-10-CM | POA: Insufficient documentation

## 2018-03-24 DIAGNOSIS — I69319 Unspecified symptoms and signs involving cognitive functions following cerebral infarction: Secondary | ICD-10-CM | POA: Diagnosis not present

## 2018-03-24 DIAGNOSIS — Z550 Illiteracy and low-level literacy: Secondary | ICD-10-CM | POA: Insufficient documentation

## 2018-03-24 DIAGNOSIS — Z833 Family history of diabetes mellitus: Secondary | ICD-10-CM | POA: Diagnosis not present

## 2018-03-24 DIAGNOSIS — I5042 Chronic combined systolic (congestive) and diastolic (congestive) heart failure: Secondary | ICD-10-CM | POA: Diagnosis not present

## 2018-03-24 DIAGNOSIS — E785 Hyperlipidemia, unspecified: Secondary | ICD-10-CM | POA: Insufficient documentation

## 2018-03-24 DIAGNOSIS — Z66 Do not resuscitate: Secondary | ICD-10-CM | POA: Insufficient documentation

## 2018-03-24 DIAGNOSIS — E1122 Type 2 diabetes mellitus with diabetic chronic kidney disease: Secondary | ICD-10-CM | POA: Diagnosis not present

## 2018-03-24 LAB — BASIC METABOLIC PANEL
ANION GAP: 7 (ref 5–15)
BUN: 35 mg/dL — ABNORMAL HIGH (ref 6–20)
CO2: 32 mmol/L (ref 22–32)
Calcium: 9.2 mg/dL (ref 8.9–10.3)
Chloride: 102 mmol/L (ref 101–111)
Creatinine, Ser: 1.85 mg/dL — ABNORMAL HIGH (ref 0.61–1.24)
GFR calc Af Amer: 39 mL/min — ABNORMAL LOW (ref >60)
GFR calc non Af Amer: 33 mL/min — ABNORMAL LOW (ref >60)
GLUCOSE: 136 mg/dL — AB (ref 65–99)
POTASSIUM: 4.4 mmol/L (ref 3.5–5.1)
Sodium: 141 mmol/L (ref 135–145)

## 2018-03-24 MED ORDER — CARVEDILOL 6.25 MG PO TABS: 6.2500 mg | ORAL_TABLET | Freq: Two times a day (BID) | ORAL | 11 refills | Status: AC

## 2018-03-24 NOTE — Progress Notes (Signed)
Advanced Heart Failure Clinic Note   Referring Physician: PCP: [name], MD PCP-Cardiologist: [facility] - Elissa Hefty, NP  HPI: Stanley Farmer is a 77 y.o. 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 PMH also includes CKD stage III, DM type II, hyperlipidemia, mild cognitive impairment, and remote alcohol tobacco abuse.  Admitted 2/1 - 11/27/17 with acute on chronic systolic CHF in setting of AFib with RVR. Initial coox 33% so placed on milrinone.  Diuresed with IV lasix and metolazone. S/p TEE-guided DCCV 11/23/2017. Once back in NSR, pt tolerated milrinone wean.  HF meds adjusted as tolerated.   Pt not LVAD candidate with RV dysfunction, CKD III-IV, and poor social situation.   Today he returns HF follow up. Overall feeling fine. Denies PND/Orthopnea. Stays in house all day. He does not walk much due to knee pain. Appetite ok. No fever or chills. He does not weigh at home. He does not have a scale. He can not ready or write [place]. His daughter prepares medications. Taking all medications  Labs (3/19): BNP 1432, digoxin < 0.2, K 4.3, creatinine 2.08, LFTs normal  Review of systems complete and found to be negative unless listed in HPI.    PMH: 1. Type II diabetes 2. Atrial fibrillation: Paroxysmal.  S/p TEE-guided DCCV 2/19.  3. H/o CVA 4. CKD stage 3 5. 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 Low EF x years.   - Cardiolite in 2011 with fixed defect, no ischemia.  - Echo (2/19): EF 10%, severe LV dilation, moderate MR, RV moderately dilated with decreased systolic function, PASP 76 mmHg.  6. LBBB 7. Mild cognitive impairment.    Current Outpatient Medications  Medication Sig Dispense Refill  . alfuzosin (UROXATRAL) 10 MG 24 hr tablet Take 10 mg by mouth daily.    Marland Kitchen amiodarone (PACERONE) 200 MG tablet Take 1 tablet (200 mg total) by mouth daily.  For 1 week then 200 mg daily 30 tablet 11  . apixaban (ELIQUIS) 5 MG TABS tablet Take 1 tablet (5 mg total) by mouth 2 (two) times daily. 60 tablet 3  . atorvastatin (LIPITOR) 40 MG tablet Take 40 mg by mouth at bedtime.     . carvedilol (COREG) 3.125 MG tablet Take 1 tablet (3.125 mg total) by mouth 2 (two) times daily. 60 tablet 11  . digoxin 62.5 MCG TABS Take 0.0625 mg by mouth daily. 30 tablet 6  . famotidine (PEPCID) 20 MG tablet Take 1 tablet (20 mg total) by mouth 2 (two) times daily. 30 tablet 0  . gabapentin (NEURONTIN) 300 MG capsule Take 300 mg by mouth 3 (three) times daily.    . insulin aspart (NOVOLOG) 100 UNIT/ML injection Inject 10-26 Units into the skin See admin instructions. Inject 10-26 units into the skin three times a day before meals, per sliding scale    . insulin glargine (LANTUS) 100 UNIT/ML injection Inject 0.6 mLs (60 Units total) into the skin every morning. (Patient taking differently: Inject 70 Units into the skin daily before breakfast. ) 30 mL 0  . KLOR-CON M20 20 MEQ tablet TAKE 2 TABLETS BY MOUTH EVERY DAY IN THE MORNING AND TAKE 1 TABLET IN THE EVENING 90 tablet 0  . losartan (COZAAR) 25 MG tablet Take 0.5 tablets (12.5 mg total) by mouth daily. 15 tablet 11  . nitroGLYCERIN (NITROLINGUAL) 0.4 MG/SPRAY spray Place 1 spray under the tongue every 5 (five) minutes x 3 doses  as needed for chest pain.    . polyethylene glycol (MIRALAX / GLYCOLAX) packet Take 17 g by mouth 2 (two) times daily.  11  . spironolactone (ALDACTONE) 25 MG tablet Take 0.5 tablets (12.5 mg total) by mouth daily. 45 tablet 3  . torsemide (DEMADEX) 20 MG tablet Take 5 tablets (100 mg total) by mouth 2 (two) times daily. 300 tablet 3   No current facility-administered medications for this encounter.     No Known Allergies    Social History   Socioeconomic History  . Marital status: Married    Spouse name: Not on file  . Number of children: Not on file  . Years of education: Not on file   . Highest education level: Not on file  Occupational History  . Not on file  Social Needs  . Financial resource strain: Not on file  . Food insecurity:    Worry: Not on file    Inability: Not on file  . Transportation needs:    Medical: Not on file    Non-medical: Not on file  Tobacco Use  . Smoking status: Former Games developer  . Smokeless tobacco: Never Used  Substance and Sexual Activity  . Alcohol use: No  . Drug use: No  . Sexual activity: Not Currently  Lifestyle  . Physical activity:    Days per week: Not on file    Minutes per session: Not on file  . Stress: Not on file  Relationships  . Social connections:    Talks on phone: Not on file    Gets together: Not on file    Attends religious service: Not on file    Active member of club or organization: Not on file    Attends meetings of clubs or organizations: Not on file    Relationship status: Not on file  . Intimate partner violence:    Fear of current or ex partner: Not on file    Emotionally abused: Not on file    Physically abused: Not on file    Forced sexual activity: Not on file  Other Topics Concern  . Not on file  Social History Narrative  . Not on file   Family History  Problem Relation Age of Onset  . Diabetes Mother    Vitals:   03/24/18 1040  BP: 120/64  Pulse: 73  SpO2: 98%  Weight: 180 lb (81.6 kg)    Wt Readings from Last 3 Encounters:  03/24/18 180 lb (81.6 kg)  02/17/18 178 lb 8 oz (81 kg)  01/06/18 174 lb 6.4 oz (79.1 kg)    PHYSICAL EXAM: Interpreter present Silvana Newness  General:  Arrived in a wheel chair.  No resp difficulty HEENT: normal Neck: supple. no JVD. Carotids 2+ bilat; no bruits. No lymphadenopathy or thryomegaly appreciated. Cor: PMI nondisplaced. Regular rate & rhythm. No rubs, gallops or murmurs. Lungs: clear Abdomen: soft, nontender, nondistended. No hepatosplenomegaly. No bruits or masses. Good bowel sounds. Extremities: no cyanosis, clubbing, rash, edema Neuro:  alert & orientedx3, cranial nerves grossly intact. moves all 4 extremities w/o difficulty. Affect pleasant  ASSESSMENT & PLAN:  1. Chronic systolic HF: Unclear etiology. Long standing CMP, no h/o of cath, but nuclear stress 2011 with fixed defect and no ischemia.  Echo 07/2016 20%.Echo 11/18/17 shows EF 10% with decreased RV function.   -NYHA II-III. Volume status stable. Continue torsemide 100 mg bid. - Continue digoxin 0.0625 daily. Recent dig level ok.  - Increase coreg to 6.25 mg twice a  day.  - Continue low dose losartan.  - Continue spironolactone 12.5 mg daily.   - Seen by EP to evaluate for CRT, they recommended against this based on his overall situation.  -Check BMET  - RV dysfunction, renal dysfunction, advanced age, and social situation precludes VAD.  2. Atrial fibrillation: Unclear chronicity.Likely related to his decompensation. CHADSVaScis 8. He is in NSR today s/p TEE-DCCV in 2/19.  - Continue amiodarone, LFTs and TSH recently checked. s, TSH today.  He will need a regular eye exam.  - No bleeding issues  -Continue eliquis 5 mg twice a day.  3. CKD III: BMET today.  4. H/o CVA: With cognitive deficits.  - Restart Eliquis as above.   5. Illiterate: Unable to read [place] or [place]. Interpreter present. His son and daughter help with his medications.  6. DNR: Pt is not a candidate for advanced therapies with age, RV dysfunction, CKD, and poor social situation.  - No change.   Follow up in 2 months with Dr Shirlee Latch and an ECHO.   Greater than 50% of the 25 minute visit was spent in counseling/coordination of care regarding disease state education, salt/fluid restriction, sliding scale diuretics, and medication compliance.    Linton Rump Kaylean Tupou NP-C  03/24/2018

## 2018-03-24 NOTE — Patient Instructions (Signed)
Routine lab work today. Will notify you of abnormal results, otherwise no news is good news!  INCREASE Carvedilol (Coreg) to 6.25 mg twice daily. Take 2 tablets of the current pills you have twice daily. New prescription has been sent to your pharmacy.  Follow up 2 months with Dr. Shirlee Latch and echocardiogram.  ______________________________________________________________ [place] Code: 1500  Take all medication as prescribed the day of your appointment. Bring all medications with you to your appointment.  Do the following things EVERYDAY: 1) Weigh yourself in the morning before breakfast. Write it down and keep it in a log. 2) Take your medicines as prescribed 3) Eat low salt foods-Limit salt (sodium) to 2000 mg per day.  4) Stay as active as you can everyday 5) Limit all fluids for the day to less than 2 liters

## 2018-05-24 ENCOUNTER — Other Ambulatory Visit (HOSPITAL_COMMUNITY): Payer: Self-pay | Admitting: Student

## 2018-06-03 ENCOUNTER — Emergency Department (HOSPITAL_COMMUNITY): Payer: Medicare Other

## 2018-06-03 ENCOUNTER — Emergency Department (HOSPITAL_COMMUNITY)
Admission: EM | Admit: 2018-06-03 | Discharge: 2018-06-03 | Disposition: A | Discharge status: Home / Self Care | Payer: Medicare Other | Attending: Emergency Medicine | Admitting: Emergency Medicine

## 2018-06-03 ENCOUNTER — Encounter (HOSPITAL_COMMUNITY): Payer: Self-pay | Admitting: Emergency Medicine

## 2018-06-03 ENCOUNTER — Other Ambulatory Visit: Payer: Self-pay

## 2018-06-03 DIAGNOSIS — Z87891 Personal history of nicotine dependence: Secondary | ICD-10-CM | POA: Diagnosis not present

## 2018-06-03 DIAGNOSIS — I5042 Chronic combined systolic (congestive) and diastolic (congestive) heart failure: Secondary | ICD-10-CM | POA: Diagnosis not present

## 2018-06-03 DIAGNOSIS — Z794 Long term (current) use of insulin: Secondary | ICD-10-CM | POA: Insufficient documentation

## 2018-06-03 DIAGNOSIS — I130 Hypertensive heart and chronic kidney disease with heart failure and stage 1 through stage 4 chronic kidney disease, or unspecified chronic kidney disease: Secondary | ICD-10-CM | POA: Diagnosis not present

## 2018-06-03 DIAGNOSIS — Z79899 Other long term (current) drug therapy: Secondary | ICD-10-CM | POA: Insufficient documentation

## 2018-06-03 DIAGNOSIS — E119 Type 2 diabetes mellitus without complications: Secondary | ICD-10-CM | POA: Diagnosis not present

## 2018-06-03 DIAGNOSIS — R140 Abdominal distension (gaseous): Secondary | ICD-10-CM | POA: Insufficient documentation

## 2018-06-03 DIAGNOSIS — N189 Chronic kidney disease, unspecified: Secondary | ICD-10-CM | POA: Diagnosis not present

## 2018-06-03 LAB — URINALYSIS, ROUTINE W REFLEX MICROSCOPIC
Bacteria, UA: NONE SEEN
Bilirubin Urine: NEGATIVE
GLUCOSE, UA: NEGATIVE mg/dL
KETONES UR: NEGATIVE mg/dL
Leukocytes, UA: NEGATIVE
NITRITE: NEGATIVE
PH: 9 — AB (ref 5.0–8.0)
Protein, ur: NEGATIVE mg/dL
Specific Gravity, Urine: 1.01 (ref 1.005–1.030)

## 2018-06-03 LAB — COMPREHENSIVE METABOLIC PANEL
ALBUMIN: 3.5 g/dL (ref 3.5–5.0)
ALK PHOS: 66 U/L (ref 38–126)
ALT: 18 U/L (ref 0–44)
AST: 20 U/L (ref 15–41)
Anion gap: 6 (ref 5–15)
BILIRUBIN TOTAL: 1.3 mg/dL — AB (ref 0.3–1.2)
BUN: 16 mg/dL (ref 8–23)
CO2: 28 mmol/L (ref 22–32)
Calcium: 9 mg/dL (ref 8.9–10.3)
Chloride: 107 mmol/L (ref 98–111)
Creatinine, Ser: 1.63 mg/dL — ABNORMAL HIGH (ref 0.61–1.24)
GFR calc Af Amer: 45 mL/min — ABNORMAL LOW (ref >60)
GFR, EST NON AFRICAN AMERICAN: 39 mL/min — AB (ref >60)
GLUCOSE: 103 mg/dL — AB (ref 70–99)
POTASSIUM: 5.7 mmol/L — AB (ref 3.5–5.1)
Sodium: 141 mmol/L (ref 135–145)
TOTAL PROTEIN: 6.5 g/dL (ref 6.5–8.1)

## 2018-06-03 LAB — CBC
HEMATOCRIT: 40.7 % (ref 39.0–52.0)
Hemoglobin: 13.1 g/dL (ref 13.0–17.0)
MCH: 32 pg (ref 26.0–34.0)
MCHC: 32.2 g/dL (ref 30.0–36.0)
MCV: 99.5 fL (ref 78.0–100.0)
PLATELETS: 161 10*3/uL (ref 150–400)
RBC: 4.09 MIL/uL — ABNORMAL LOW (ref 4.22–5.81)
RDW: 14.8 % (ref 11.5–15.5)
WBC: 6.7 10*3/uL (ref 4.0–10.5)

## 2018-06-03 LAB — BRAIN NATRIURETIC PEPTIDE: B NATRIURETIC PEPTIDE 5: 1458.6 pg/mL — AB (ref 0.0–100.0)

## 2018-06-03 LAB — I-STAT TROPONIN, ED: Troponin i, poc: 0.04 ng/mL (ref 0.00–0.08)

## 2018-06-03 LAB — LIPASE, BLOOD: Lipase: 28 U/L (ref 11–51)

## 2018-06-03 MED ORDER — DEXTROSE 5 % IV SOLN
120.0000 mg | Freq: Once | INTRAVENOUS | Status: AC
  Administered 2018-06-03: 120 mg via INTRAVENOUS
  Filled 2018-06-03: qty 4

## 2018-06-03 NOTE — ED Notes (Signed)
Pt in restroom when called for triage-ekg delayed

## 2018-06-03 NOTE — ED Notes (Signed)
Pt ambulated with walker.  O2 sats decreased to 91% while ambulating.  PT rr mildly labored, though pt and family state that is the norm per pt.  MD notified.

## 2018-06-03 NOTE — ED Triage Notes (Signed)
Pt presents to ED for assessment of abdominal distention x 1 week, worsening ability to take a deep breath.  Denies pain, denies changes in urination, LBM this morning, patient states it was "normal".  Hx of CHF.

## 2018-06-03 NOTE — ED Provider Notes (Signed)
Transfer of Care Note I assumed care of Stanley Farmer on 06/03/2018  Briefly, Stanley Farmer is a 77 y.o. male who:  One week of abdominal distention  Out of his home torsemide  No CP, abdominal pain, or abdominal tenderness  Concern for CHF exacerbation  The plan includes:  Getting lasix and reassess   Please refer to the original provider's note for additional information regarding the care of Stanley Farmer.  ### Reassessment: I personally reassessed the patient:  Vital Signs:  The most current vitals were  Vitals:   06/03/18 1133 06/03/18 1252  BP: 116/70 100/60  Pulse: 78 65  Resp: 18 12  Temp: 98.6 F (37 C)   SpO2: 96% 97%    Hemodynamics:  The patient is hemodynamically stable. Mental Status:  The patient is Alert  Additional MDM: I reassessed the patient.  He appears stable.  He is at his baseline.  He ambulated from the ED and his sats were 91%.  His family reports that this is normal for him and this is his baseline.  He would very much like to be discharged home.  He produced at least 1 L of urine while in the ED after receiving Lasix.  The family now has his home diuretic in hand.  They agreed to follow-up with her PCP.  I reviewed his labs and work-up prior to discharge.  He was mildly hypokalemic at 5.7 without any ECG changes prior to Lasix.  His potassium was improved after Lasix and he will now be on his home non-potassium sparing diuretic.  I believe that he is safe and stable for outpatient follow-up.    Talitha Givens, MD 06/03/18 1779    Blane Ohara, MD 06/04/18 [id number]

## 2018-06-03 NOTE — Discharge Instructions (Addendum)
[facility] [place]:  Thank you for allowing [place] to take care of you today.  We hope you begin feeling better soon.  To-Do: Please follow-up with your primary doctor Please take your torsemide as prescribed Please return to the Emergency Department or call 911 if you experience chest pain, shortness of breath, severe pain, severe fever, altered mental status, or have any reason to think that you need emergency medical care.  Thank you again.  Hope you feel better soon.

## 2018-06-03 NOTE — ED Provider Notes (Signed)
[facility] EMERGENCY DEPARTMENT Provider Note   CSN: [id number] Arrival date & time: 06/03/18  1109     History   Chief Complaint Chief Complaint  Patient presents with  . Abdominal Distention    HPI Farmer Stanley is a 77 y.o. male with history of CHF, CKD, hypertension, paroxysmal atrial fibrillation who presents with abdominal distention and shortness of breath.  Patient reports his symptoms have been present and progressive worsening for the past week.  He ran out of torsemide 3 days ago and patient's son reports that the pharmacist call the doctor for refill, however has not been available for pickup yet.  Patient normally takes 100 mg twice daily, as well as spironolactone.  Patient has some difficulty taking a deep breath, but denies any severe shortness of breath or chest pain.  He also reports he has had a month of intermittent dizziness when he moves his head or stands up.  He believes he has wax in his ear and that might be the problem.  He describes it as a room spinning dizziness.  He denies any vision changes, numbness or tingling.  He denies any vertigo at this time.  HPI  Past Medical History:  Diagnosis Date  . AKI (acute kidney injury) (HCC) 11/20/2017  . CHF (congestive heart failure) (HCC)   . CKD (chronic kidney disease)   . 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 Diabetes mellitus without complication (HCC)   . Hypertension   . 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 PAF (paroxysmal atrial fibrillation) (HCC) 11/20/2017    Patient Active Problem List   Diagnosis Date Noted  . 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 PAF (paroxysmal atrial fibrillation) (HCC) 11/20/2017  . AKI (acute kidney injury) (HCC) 11/20/2017  . Cardiogenic shock (HCC)   . BPH (benign prostatic hyperplasia) 11/18/2017  . Abdominal distension   . Type 2 diabetes mellitus with complication (HCC)   . CKD (chronic kidney disease)    . Abdominal bloating   . 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 Chronic combined systolic and diastolic CHF (congestive heart failure) (HCC) 04/21/2014  . Acute respiratory failure (HCC) 04/21/2014  . Essential hypertension 04/21/2014  . IDDM (insulin dependent diabetes mellitus) (HCC) 04/21/2014  . Non-English speaking patient 04/21/2014  . 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 Overweight 04/21/2014    Past Surgical History:  Procedure Laterality Date  . CARDIOVERSION N/A 11/22/2017   Procedure: CARDIOVERSION;  Surgeon: Dolores Patty, MD;  Location: [facility] ENDOSCOPY;  Service: Cardiovascular;  Laterality: N/A;  . KIDNEY STONE SURGERY    . TEE WITHOUT CARDIOVERSION N/A 11/22/2017   Procedure: TRANSESOPHAGEAL ECHOCARDIOGRAM (TEE);  Surgeon: Dolores Patty, MD;  Location: [facility] ENDOSCOPY;  Service: Cardiovascular;  Laterality: N/A;        Home Medications    Prior to Admission medications   Medication Sig Start Date End Date Taking? Authorizing Provider  alfuzosin (UROXATRAL) 10 MG 24 hr tablet Take 10 mg by mouth daily.    [provider]  amiodarone (PACERONE) 200 MG tablet Take 1 tablet (200 mg total) by mouth daily. For 1 week then 200 mg daily 12/05/17   Graciella Freer, PA-C  apixaban (ELIQUIS) 5 MG TABS tablet Take 1 tablet (5 mg total) by mouth 2 (two) times daily. 02/17/18   Laurey Morale, MD  atorvastatin (LIPITOR) 40 MG tablet Take 40 mg by mouth at bedtime.     [provider]  carvedilol (COREG) 6.25 MG tablet Take 1 tablet (6.25 mg total) by mouth 2 (two) times daily. 03/24/18 03/24/19  Clegg, Amy D, NP  digoxin 62.5 MCG TABS Take 0.0625 mg by mouth daily. 01/06/18   Graciella Freer, PA-C  famotidine (PEPCID) 20 MG tablet Take 1 tablet (20 mg total) by mouth 2 (two) times daily. 08/03/15   Rolan Bucco, MD  gabapentin (NEURONTIN) 300 MG capsule Take 300 mg by mouth 3 (three) times daily.     [provider]  insulin aspart (NOVOLOG) 100 UNIT/ML injection Inject 10-26 Units into the skin See admin instructions. Inject 10-26 units into the skin three times a day before meals, per sliding scale    [provider]  insulin glargine (LANTUS) 100 UNIT/ML injection Inject 0.6 mLs (60 Units total) into the skin every morning. Patient taking differently: Inject 70 Units into the skin daily before breakfast.  11/27/17   Randel Pigg, Dorma Russell, MD  KLOR-CON M20 20 MEQ tablet TAKE 2 TABLETS BY MOUTH EVERY DAY IN THE MORNING AND TAKE 1 TABLET IN THE EVENING 03/06/18   Laurey Morale, MD  KLOR-CON M20 20 MEQ tablet TAKE 2 TABLETS (40 MEQ TOTAL) BY MOUTH EVERY MORNING AND 1 TABLET (20 MEQ TOTAL) EVERY EVENING. 05/24/18   Graciella Freer, PA-C  losartan (COZAAR) 25 MG tablet Take 0.5 tablets (12.5 mg total) by mouth daily. 12/23/17 12/23/18  Graciella Freer, PA-C  nitroGLYCERIN (NITROLINGUAL) 0.4 MG/SPRAY spray Place 1 spray under the tongue every 5 (five) minutes x 3 doses as needed for chest pain.    [provider]  polyethylene glycol (MIRALAX / GLYCOLAX) packet Take 17 g by mouth 2 (two) times daily. 12/20/17   [provider]  spironolactone (ALDACTONE) 25 MG tablet Take 0.5 tablets (12.5 mg total) by mouth daily. 02/17/18 05/18/18  Laurey Morale, MD  torsemide (DEMADEX) 20 MG tablet Take 5 tablets (100 mg total) by mouth 2 (two) times daily. 02/17/18   Laurey Morale, MD    Family History Family History  Problem Relation Age of Onset  . Diabetes Mother     Social History Social History   Tobacco Use  . Smoking status: Former Games developer  . Smokeless tobacco: Never Used  Substance Use Topics  . Alcohol use: No  . Drug use: No     Allergies   Patient has no known allergies.   Review of Systems Review of Systems  Constitutional: Negative for chills and fever.  HENT: Negative for facial swelling and sore throat.   Respiratory: Positive for  shortness of breath (difficulty taking a deep breath).   Cardiovascular: Positive for leg swelling. Negative for chest pain.  Gastrointestinal: Positive for abdominal distention. Negative for abdominal pain, nausea and vomiting.  Genitourinary: Negative for dysuria.  Musculoskeletal: Negative for back pain.  Skin: Negative for rash and wound.  Neurological: Positive for dizziness. Negative for headaches.  Psychiatric/Behavioral: The patient is not nervous/anxious.      Physical Exam Updated Vital Signs BP 100/60   Pulse 65   Temp 98.6 F (37 C) (Oral)   Resp 12   SpO2 97%   Physical Exam  Constitutional: He appears well-developed and well-nourished. No distress.  HENT:  Head: Normocephalic and atraumatic.  Left Ear: Tympanic membrane normal.  Mouth/Throat: Oropharynx is clear and moist. No oropharyngeal exudate.  Cerumen impaction in the R ear, cannot visualize TM  Eyes: Pupils are equal, round, and reactive to light. Conjunctivae and EOM are normal.  Right eye exhibits no discharge. Left eye exhibits no discharge. No scleral icterus.  Neck: Normal range of motion. Neck supple. No thyromegaly present.  Cardiovascular: Normal rate, regular rhythm, normal heart sounds and intact distal pulses. Exam reveals no gallop and no friction rub.  No murmur heard. Pulmonary/Chest: Effort normal and breath sounds normal. No stridor. No respiratory distress. He has no wheezes. He has no rales.  Abdominal: Soft. Bowel sounds are normal. He exhibits distension. There is no tenderness. There is no rebound and no guarding.  Musculoskeletal: He exhibits edema (2 + pitting peripheral edema bilaterally).  Lymphadenopathy:    He has no cervical adenopathy.  Neurological: He is alert. Coordination normal.  CN 3-12 intact; normal sensation throughout; 5/5 strength in all 4 extremities; equal bilateral grip strength  Skin: Skin is warm and dry. No rash noted. He is not diaphoretic. No pallor.    Psychiatric: He has a normal mood and affect.  Nursing note and vitals reviewed.    ED Treatments / Results  Labs (all labs ordered are listed, but only abnormal results are displayed) Labs Reviewed  COMPREHENSIVE METABOLIC PANEL - Abnormal; Notable for the following components:      Result Value   Potassium 5.7 (*)    Glucose, Bld 103 (*)    Creatinine, Ser 1.63 (*)    Total Bilirubin 1.3 (*)    GFR calc non Af Amer 39 (*)    GFR calc Af Amer 45 (*)    All other components within normal limits  CBC - Abnormal; Notable for the following components:   RBC 4.09 (*)    All other components within normal limits  BRAIN NATRIURETIC PEPTIDE - Abnormal; Notable for the following components:   B Natriuretic Peptide 1,458.6 (*)    All other components within normal limits  LIPASE, BLOOD  URINALYSIS, ROUTINE W REFLEX MICROSCOPIC  I-STAT TROPONIN, ED    EKG EKG Interpretation  Date/Time:  Saturday June 03 2018 11:34:47 EDT Ventricular Rate:  76 PR Interval:  246 QRS Duration: 204 QT Interval:  466 QTC Calculation: 524 R Axis:   -83 Text Interpretation:  Sinus rhythm with 1st degree A-V block Left axis deviation Left bundle branch block Abnormal ECG similar to prior 5/19 Confirmed by Meridee Score [id number]) on 06/03/2018 2:52:25 PM   Radiology Dg Chest 2 View  Result Date: 06/03/2018 CLINICAL DATA:  Fluid overload, shortness of breath EXAM: CHEST - 2 VIEW COMPARISON:  01/11/2018 FINDINGS: Cardiomegaly noted with mild vascular congestion. No significant edema pattern or CHF. Small pleural effusions present. Minor basilar atelectasis. No definite focal pneumonia, collapse or consolidation. No pneumothorax. Trachea is midline. Aorta is atherosclerotic. IMPRESSION: Cardiomegaly with vascular congestion Small pleural effusions No definite CHF pattern or focal pneumonia. Electronically Signed   By: Judie Petit.  Shick M.D.   On: 06/03/2018 15:03    Procedures Procedures (including critical  care time)  Medications Ordered in ED Medications  furosemide (LASIX) 120 mg in dextrose 5 % 50 mL IVPB (has no administration in time range)     Initial Impression / Assessment and Plan / ED Course  I have reviewed the triage vital signs and the nursing notes.  Pertinent labs & imaging results that were available during my care of the patient were reviewed by me and considered in my medical decision making (see chart for details).  Clinical Course as of Jun 04 1635  Sat Jun 03, 2018  1604 With history of CHF here with increased abdominal distention and  increased work of breathing.  His BNP is quite elevated here but he has had similar elevations in the past.  Chest x-ray no obvious pneumonias.  Skin get some diuresis and reevaluation to see if he would be okay for home.   [MB]    Clinical Course User Index [MB] Terrilee Files, MD    Patient with suspected CHF exacerbation after being out of his torsemide.  CMP shows mild hyperkalemia, 5.7.  CBC unremarkable.  BMP shows 1458.6.  Chest x-ray shows small pleural effusions, cardiomegaly with vascular congestion, but no definite CHF pattern or focal pneumonia.  Abdomen is soft and nontender.  Low suspicion for intra-abdominal process, no imaging indicated at this time.  EKG shows NSR with first-degree block, left bundle branch block, but similar to prior.  At shift change, patient yet to receive IV dose of torsemide.  Will sign out to oncoming provider, Dr. Jodi Mourning and resident physician, Dr. Drue Dun, for follow-up and determination of disposition.  If patient improves after torsemide and can ambulate, plan to have close follow-up with PCP.  Patient also evaluated by Dr. Charm Barges who guided the patient's management and agrees with plan.  Final Clinical Impressions(s) / ED Diagnoses   Final diagnoses:  Abdominal distension    ED Discharge Orders    None       Emi Holes, PA-C 06/03/18 1636    Terrilee Files, MD 06/03/18  1840

## 2018-06-12 ENCOUNTER — Telehealth (HOSPITAL_COMMUNITY): Payer: Self-pay | Admitting: *Deleted

## 2018-06-12 NOTE — Telephone Encounter (Signed)
Pt's friend called for pt, he states pt is having chest pain on the right side and is having trouble breathing.  He states this has been going on for a while and is not getting better.  He is unable to describe pain but states pt is having symptoms now.  Advised pt should report to er for further eval, he is agreeable and will advise pt.

## 2018-06-12 NOTE — Telephone Encounter (Signed)
Pedro called back about pt since he is with him now.  Pt describes the pain as a burning and states it happens after he eats.  He states when he eats it feels like the food gets stuck in his throat and he has to drink water, then he has a burning like he would have drank something hot.  He states this is relieved by burping.  Pt is not having symptoms at this time.  Advised sounds like acid reflux pt should f/u w/pcp, Burley Saver is agreeable and will call that office.

## 2018-06-16 ENCOUNTER — Ambulatory Visit (HOSPITAL_BASED_OUTPATIENT_CLINIC_OR_DEPARTMENT_OTHER)
Admission: RE | Admit: 2018-06-16 | Discharge: 2018-06-16 | Disposition: A | Discharge status: Home / Self Care | Payer: Medicare Other | Source: Ambulatory Visit | Attending: Cardiology | Admitting: Cardiology

## 2018-06-16 ENCOUNTER — Telehealth (HOSPITAL_COMMUNITY): Payer: Self-pay | Admitting: Cardiology

## 2018-06-16 ENCOUNTER — Ambulatory Visit (HOSPITAL_COMMUNITY)
Admission: RE | Admit: 2018-06-16 | Discharge: 2018-06-16 | Disposition: A | Discharge status: Home / Self Care | Payer: Medicare Other | Source: Ambulatory Visit | Attending: Cardiology | Admitting: Cardiology

## 2018-06-16 ENCOUNTER — Other Ambulatory Visit: Payer: Self-pay

## 2018-06-16 ENCOUNTER — Encounter (HOSPITAL_COMMUNITY): Payer: Self-pay | Admitting: Cardiology

## 2018-06-16 VITALS — BP 98/48 | HR 55 | Wt 182.2 lb

## 2018-06-16 DIAGNOSIS — I480 Paroxysmal atrial fibrillation: Secondary | ICD-10-CM | POA: Insufficient documentation

## 2018-06-16 DIAGNOSIS — I5022 Chronic systolic (congestive) heart failure: Secondary | ICD-10-CM | POA: Diagnosis not present

## 2018-06-16 DIAGNOSIS — Z794 Long term (current) use of insulin: Secondary | ICD-10-CM | POA: Diagnosis not present

## 2018-06-16 DIAGNOSIS — Z87891 Personal history of nicotine dependence: Secondary | ICD-10-CM | POA: Insufficient documentation

## 2018-06-16 DIAGNOSIS — E1122 Type 2 diabetes mellitus with diabetic chronic kidney disease: Secondary | ICD-10-CM | POA: Insufficient documentation

## 2018-06-16 DIAGNOSIS — Z8673 Personal history of transient ischemic attack (TIA), and cerebral infarction without residual deficits: Secondary | ICD-10-CM | POA: Diagnosis not present

## 2018-06-16 DIAGNOSIS — I5042 Chronic combined systolic (congestive) and diastolic (congestive) heart failure: Secondary | ICD-10-CM

## 2018-06-16 DIAGNOSIS — N183 Chronic kidney disease, stage 3 (moderate): Secondary | ICD-10-CM | POA: Insufficient documentation

## 2018-06-16 DIAGNOSIS — R7989 Other specified abnormal findings of blood chemistry: Secondary | ICD-10-CM

## 2018-06-16 DIAGNOSIS — Z7901 Long term (current) use of anticoagulants: Secondary | ICD-10-CM | POA: Diagnosis not present

## 2018-06-16 LAB — BASIC METABOLIC PANEL
Anion gap: 12 (ref 5–15)
BUN: 22 mg/dL (ref 8–23)
CALCIUM: 8.6 mg/dL — AB (ref 8.9–10.3)
CO2: 29 mmol/L (ref 22–32)
CREATININE: 1.84 mg/dL — AB (ref 0.61–1.24)
Chloride: 99 mmol/L (ref 98–111)
GFR calc non Af Amer: 33 mL/min — ABNORMAL LOW (ref >60)
GFR, EST AFRICAN AMERICAN: 39 mL/min — AB (ref >60)
Glucose, Bld: 128 mg/dL — ABNORMAL HIGH (ref 70–99)
Potassium: 3.8 mmol/L (ref 3.5–5.1)
SODIUM: 140 mmol/L (ref 135–145)

## 2018-06-16 LAB — TSH: TSH: 8.171 u[IU]/mL — ABNORMAL HIGH (ref 0.350–4.500)

## 2018-06-16 MED ORDER — POTASSIUM CHLORIDE CRYS ER 20 MEQ PO TBCR: 20.0000 meq | EXTENDED_RELEASE_TABLET | Freq: Two times a day (BID) | ORAL | 0 refills | Status: AC

## 2018-06-16 MED ORDER — PERFLUTREN LIPID MICROSPHERE
1.0000 mL | INTRAVENOUS | Status: DC | PRN
  Administered 2018-06-16: 2 mL via INTRAVENOUS
  Filled 2018-06-16: qty 10

## 2018-06-16 MED ORDER — METOLAZONE 2.5 MG PO TABS: 2.5000 mg | ORAL_TABLET | ORAL | 3 refills | Status: AC

## 2018-06-16 NOTE — Telephone Encounter (Signed)
Notes recorded by Theresia Bough, CMA on 06/16/2018 at 4:31 PM EDT LABS ADDED TO UPCOMING APPT 9/20 ------  Notes recorded by Laurey Morale, MD on 06/16/2018 at 4:08 PM EDT Creatinine fairly stable. K is not elevated. TSH mildly elevated, send free T3 and free T4 with next labs.

## 2018-06-16 NOTE — Progress Notes (Signed)
  Echocardiogram 2D Echocardiogram has been performed.  Rudine Rieger G Alphonse Asbridge 06/16/2018, 2:13 PM

## 2018-06-16 NOTE — Patient Instructions (Signed)
Take metolazone 2.5 mg tablet once every Saturday morning. Also take one extra tablet next Wednesday.  Take Potassium 20 meq (1 tab) twice daily. Take 1 extra tablet on Saturdays with metolazone.  Routine lab work today. Will notify you of abnormal results, otherwise no news is good news!  Return in 2-3 week for follow up and lab work.  ___________________________________________________________________ [place] Code: 1600  Take all medication as prescribed the day of your appointment. Bring all medications with you to your appointment.  Do the following things EVERYDAY: 1) Weigh yourself in the morning before breakfast. Write it down and keep it in a log. 2) Take your medicines as prescribed 3) Eat low salt foods-Limit salt (sodium) to 2000 mg per day.  4) Stay as active as you can everyday 5) Limit all fluids for the day to less than 2 liters

## 2018-06-19 NOTE — Progress Notes (Signed)
Advanced Heart Failure Clinic Note   Referring Physician: PCP: [name], MD HF Cardiology: Dr. Shirlee Latch  HPI: Stanley Farmer is a 77 y.o. 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 PMH also includes CKD stage III, DM type II, hyperlipidemia, mild cognitive impairment, and remote alcohol tobacco abuse.  Admitted 2/1 - 11/27/17 with acute on chronic systolic CHF in setting of AFib with RVR. Initial coox 33% so placed on milrinone.  Diuresed with IV lasix and metolazone. S/p TEE-guided DCCV 11/23/2017. Once back in NSR, pt tolerated milrinone wean.  HF meds adjusted as tolerated.   Pt not LVAD candidate with RV dysfunction, CKD III-IV, and poor social situation.   Echo (8/19): EF 20-25%, severe LV dilation, septal-lateral dyssynchrony, mildly decreased RV systolic function, moderate pulmonary hypertension.   Today he returns HF follow up. He cannot ready or write [place]. His daughter prepares medications. Taking all medications.  Interpreter present.  He is minimally interactive, his son answers all questions.  He is minimally active, does walk around the house.  Short of breath with most exertion. No chest pain.  BP soft but no falls, does not report lightheadedness.  Weight is stable.    ECG (8/19, personally reviewed): NSR, LBBB  Labs (3/19): BNP 1432, digoxin < 0.2, K 4.3, creatinine 2.08, LFTs normal Labs (8/19): K 5.7, creatinine 1.6, hgb 13.4, LFTs normal  Review of systems complete and found to be negative unless listed in HPI.    PMH: 1. Type II diabetes 2. Atrial fibrillation: Paroxysmal.  S/p TEE-guided DCCV 2/19.  3. H/o CVA 4. CKD stage 3 5. 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 Low EF x years.   - Cardiolite in 2011 with fixed defect, no ischemia.  - Echo (2/19): EF 10%, severe LV dilation, moderate MR, RV moderately dilated with decreased systolic  function, PASP 76 mmHg.  - Echo (8/19): EF 20-25%, severe LV dilation, septal-lateral dyssynchrony, mildly decreased RV systolic function, moderate pulmonary hypertension.  6. LBBB 7. Mild cognitive impairment.  8. BPH  Current Outpatient Medications  Medication Sig Dispense Refill  . alfuzosin (UROXATRAL) 10 MG 24 hr tablet Take 10 mg by mouth daily.    Marland Kitchen amiodarone (PACERONE) 200 MG tablet Take 200 mg by mouth daily.    Marland Kitchen apixaban (ELIQUIS) 5 MG TABS tablet Take 1 tablet (5 mg total) by mouth 2 (two) times daily. 60 tablet 3  . atorvastatin (LIPITOR) 40 MG tablet Take 40 mg by mouth at bedtime.     . carvedilol (COREG) 6.25 MG tablet Take 1 tablet (6.25 mg total) by mouth 2 (two) times daily. 60 tablet 11  . digoxin 62.5 MCG TABS Take 0.0625 mg by mouth daily. 30 tablet 6  . famotidine (PEPCID) 20 MG tablet Take 1 tablet (20 mg total) by mouth 2 (two) times daily. 30 tablet 0  . gabapentin (NEURONTIN) 300 MG capsule Take 300 mg by mouth 3 (three) times daily.    Marland Kitchen HUMALOG KWIKPEN 100 UNIT/ML KiwkPen Inject into the skin See admin instructions. Use three times daily up to a max dose of 100 units  3  . insulin glargine (LANTUS) 100 UNIT/ML injection Inject 0.6 mLs (60 Units total) into the skin every morning. (Patient taking differently: Inject 50 Units into the skin daily before breakfast. ) 30 mL 0  . losartan (COZAAR) 25 MG tablet Take 0.5 tablets (12.5 mg total) by mouth daily. 15 tablet  11  . nitroGLYCERIN (NITROSTAT) 0.4 MG SL tablet Place 0.4 mg under the tongue every 5 (five) minutes as needed for chest pain.    . polyethylene glycol (MIRALAX / GLYCOLAX) packet Take 17 g by mouth daily.   11  . potassium chloride SA (KLOR-CON M20) 20 MEQ tablet Take 1 tablet (20 mEq total) by mouth 2 (two) times daily. Take 1 extra tablet on Saturdays with metolazone. 90 tablet 0  . spironolactone (ALDACTONE) 25 MG tablet Take 0.5 tablets (12.5 mg total) by mouth daily. 45 tablet 3  . torsemide  (DEMADEX) 20 MG tablet Take 5 tablets (100 mg total) by mouth 2 (two) times daily. 300 tablet 3  . metolazone (ZAROXOLYN) 2.5 MG tablet Take 1 tablet (2.5 mg total) by mouth once a week. Every Saturday morning. Take 1 extra potassium tablet with this. 15 tablet 3   No current facility-administered medications for this encounter.     No Known Allergies    Social History   Socioeconomic History  . Marital status: Married    Spouse name: Not on file  . Number of children: Not on file  . Years of education: Not on file  . Highest education level: Not on file  Occupational History  . Not on file  Social Needs  . Financial resource strain: Not on file  . Food insecurity:    Worry: Not on file    Inability: Not on file  . Transportation needs:    Medical: Not on file    Non-medical: Not on file  Tobacco Use  . Smoking status: Former Games developer  . Smokeless tobacco: Never Used  Substance and Sexual Activity  . Alcohol use: No  . Drug use: No  . Sexual activity: Not Currently  Lifestyle  . Physical activity:    Days per week: Not on file    Minutes per session: Not on file  . Stress: Not on file  Relationships  . Social connections:    Talks on phone: Not on file    Gets together: Not on file    Attends religious service: Not on file    Active member of club or organization: Not on file    Attends meetings of clubs or organizations: Not on file    Relationship status: Not on file  . Intimate partner violence:    Fear of current or ex partner: Not on file    Emotionally abused: Not on file    Physically abused: Not on file    Forced sexual activity: Not on file  Other Topics Concern  . Not on file  Social History Narrative  . Not on file   Family History  Problem Relation Age of Onset  . Diabetes Mother    Vitals:   06/16/18 1406  BP: (!) 98/48  Pulse: (!) 55  SpO2: 96%  Weight: 82.7 kg (182 lb 4 oz)    Wt Readings from Last 3 Encounters:  06/16/18 82.7 kg (182  lb 4 oz)  03/24/18 81.6 kg (180 lb)  02/17/18 81 kg (178 lb 8 oz)    PHYSICAL EXAM: General: NAD Neck: JVP 10-12 cm, no thyromegaly or thyroid nodule.  Lungs: Crackles at bases.  CV: Lateral PMI.  Heart distant sounds, regular S1/S2, no S3/S4, no murmur.  2+ edema 1/2 to knees bilaterally.  No carotid bruit.  Normal pedal pulses.  Abdomen: Soft, nontender, no hepatosplenomegaly, no distention.  Skin: Intact without lesions or rashes.  Neurologic: Minimally interactive  Psych:  Depressed affect. Extremities: No clubbing or cyanosis.  HEENT: Normal.   ASSESSMENT & PLAN:  1. Chronic systolic HF: Unclear etiology. Long standing CMP, no h/o of cath, but nuclear stress 2011 with fixed defect and no ischemia.  Echo 07/2016 20%.Echo 11/18/17 shows EF 10% with decreased RV function. Echo was done today and reviewed, severely dilated LV with EF around 20%.  NYHA class IIIb symptoms chronically, he is minimally active. He is volume overloaded on exam.   - Continue torsemide 100 mg bid.  I will have him take metolazone this week on Saturday and then Wednesday with am torsemide.  After that, he will take metolazone 2.5 mg once weekly on Saturdays.  He will take KCl 20 bid (recently decreased) except on metolazone days when he will take 40 qam/20 qpm.  BMET today and again in 10 days.  - Continue digoxin 0.0625 daily. Check digoxin level.   - Continue Coreg 6.25 mg twice a day.  - Continue low dose losartan, no BP room to increase.  - Continue spironolactone 12.5 mg daily, no BP room to increase.  Will need to get BMET today to ensure that K is no longer elevated.   - Seen by EP to evaluate for CRT given LBBB, they recommended against this based on his overall prognosis.   - RV dysfunction, renal dysfunction, advanced age, and social situation precludes LVAD. Overall, I think that his prognosis is poor.  He is quite limited.   2. Atrial fibrillation: Paroxysmal. CHADSVaScis 8. He is in NSR today s/p  TEE-DCCV in 2/19.  - Continue amiodarone, LFTs recently normal.  Check TSH today. He will need a regular eye exam.  -Continue Eliquis 5 mg twice a day.  3. CKD III: BMET today.  4. H/o CVA: With cognitive deficits.  - Continue Eliquis as above.   5. Illiterate: Unable to read [place] or [place]. Interpreter present. His son and daughter help with his medications.  6. DNR: Pt is not a candidate for advanced therapies with age, RV dysfunction, CKD, and poor social situation.   Followup in 3 wks with APP to reassess volume status.   Marca Ancona 06/19/2018

## 2018-06-26 ENCOUNTER — Other Ambulatory Visit (HOSPITAL_COMMUNITY): Payer: Self-pay | Admitting: Cardiology

## 2018-06-30 ENCOUNTER — Encounter (HOSPITAL_COMMUNITY): Payer: Medicare Other

## 2018-07-01 ENCOUNTER — Other Ambulatory Visit (HOSPITAL_COMMUNITY): Payer: Self-pay | Admitting: Cardiology

## 2018-07-07 ENCOUNTER — Ambulatory Visit (HOSPITAL_COMMUNITY)
Admission: RE | Admit: 2018-07-07 | Discharge: 2018-07-07 | Disposition: A | Discharge status: Home / Self Care | Payer: Medicare Other | Source: Ambulatory Visit | Attending: Cardiology | Admitting: Cardiology

## 2018-07-07 ENCOUNTER — Encounter (HOSPITAL_COMMUNITY): Payer: Self-pay

## 2018-07-07 VITALS — BP 114/68 | HR 54 | Wt 178.0 lb

## 2018-07-07 DIAGNOSIS — Z8673 Personal history of transient ischemic attack (TIA), and cerebral infarction without residual deficits: Secondary | ICD-10-CM | POA: Insufficient documentation

## 2018-07-07 DIAGNOSIS — R7989 Other specified abnormal findings of blood chemistry: Secondary | ICD-10-CM | POA: Diagnosis not present

## 2018-07-07 DIAGNOSIS — N183 Chronic kidney disease, stage 3 unspecified: Secondary | ICD-10-CM

## 2018-07-07 DIAGNOSIS — I5042 Chronic combined systolic (congestive) and diastolic (congestive) heart failure: Secondary | ICD-10-CM

## 2018-07-07 DIAGNOSIS — I5022 Chronic systolic (congestive) heart failure: Secondary | ICD-10-CM | POA: Diagnosis not present

## 2018-07-07 DIAGNOSIS — I10 Essential (primary) hypertension: Secondary | ICD-10-CM | POA: Diagnosis not present

## 2018-07-07 DIAGNOSIS — I447 Left bundle-branch block, unspecified: Secondary | ICD-10-CM | POA: Diagnosis not present

## 2018-07-07 DIAGNOSIS — Z66 Do not resuscitate: Secondary | ICD-10-CM | POA: Diagnosis not present

## 2018-07-07 DIAGNOSIS — Z79899 Other long term (current) drug therapy: Secondary | ICD-10-CM | POA: Diagnosis not present

## 2018-07-07 DIAGNOSIS — Z7901 Long term (current) use of anticoagulants: Secondary | ICD-10-CM | POA: Insufficient documentation

## 2018-07-07 DIAGNOSIS — Z87891 Personal history of nicotine dependence: Secondary | ICD-10-CM | POA: Diagnosis not present

## 2018-07-07 DIAGNOSIS — Z550 Illiteracy and low-level literacy: Secondary | ICD-10-CM | POA: Insufficient documentation

## 2018-07-07 DIAGNOSIS — E1122 Type 2 diabetes mellitus with diabetic chronic kidney disease: Secondary | ICD-10-CM | POA: Insufficient documentation

## 2018-07-07 DIAGNOSIS — Z09 Encounter for follow-up examination after completed treatment for conditions other than malignant neoplasm: Secondary | ICD-10-CM | POA: Diagnosis present

## 2018-07-07 DIAGNOSIS — N400 Enlarged prostate without lower urinary tract symptoms: Secondary | ICD-10-CM | POA: Diagnosis not present

## 2018-07-07 DIAGNOSIS — Z833 Family history of diabetes mellitus: Secondary | ICD-10-CM | POA: Insufficient documentation

## 2018-07-07 DIAGNOSIS — Z794 Long term (current) use of insulin: Secondary | ICD-10-CM | POA: Diagnosis not present

## 2018-07-07 DIAGNOSIS — I429 Cardiomyopathy, unspecified: Secondary | ICD-10-CM | POA: Insufficient documentation

## 2018-07-07 DIAGNOSIS — G3184 Mild cognitive impairment, so stated: Secondary | ICD-10-CM | POA: Insufficient documentation

## 2018-07-07 DIAGNOSIS — Z789 Other specified health status: Secondary | ICD-10-CM

## 2018-07-07 DIAGNOSIS — I272 Pulmonary hypertension, unspecified: Secondary | ICD-10-CM | POA: Insufficient documentation

## 2018-07-07 DIAGNOSIS — I480 Paroxysmal atrial fibrillation: Secondary | ICD-10-CM

## 2018-07-07 LAB — BASIC METABOLIC PANEL
Anion gap: 9 (ref 5–15)
BUN: 29 mg/dL — AB (ref 8–23)
CHLORIDE: 99 mmol/L (ref 98–111)
CO2: 33 mmol/L — ABNORMAL HIGH (ref 22–32)
Calcium: 9 mg/dL (ref 8.9–10.3)
Creatinine, Ser: 2.02 mg/dL — ABNORMAL HIGH (ref 0.61–1.24)
GFR calc Af Amer: 35 mL/min — ABNORMAL LOW (ref >60)
GFR calc non Af Amer: 30 mL/min — ABNORMAL LOW (ref >60)
Glucose, Bld: 240 mg/dL — ABNORMAL HIGH (ref 70–99)
Potassium: 4.5 mmol/L (ref 3.5–5.1)
SODIUM: 141 mmol/L (ref 135–145)

## 2018-07-07 LAB — T4, FREE: Free T4: 1.2 ng/dL (ref 0.82–1.77)

## 2018-07-07 LAB — TSH: TSH: 7.214 u[IU]/mL — AB (ref 0.350–4.500)

## 2018-07-07 NOTE — Progress Notes (Signed)
Advanced Heart Failure Clinic Note   Referring Physician: PCP: [name], MD HF Cardiology: Dr. Shirlee Latch  HPI: Stanley Farmer is a 77 y.o. 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 PMH also includes CKD stage III, DM type II, hyperlipidemia, mild cognitive impairment, and remote alcohol tobacco abuse.  Admitted 2/1 - 11/27/17 with acute on chronic systolic CHF in setting of AFib with RVR. Initial coox 33% so placed on milrinone.  Diuresed with IV lasix and metolazone. S/p TEE-guided DCCV 11/23/2017. Once back in NSR, pt tolerated milrinone wean.  HF meds adjusted as tolerated.   Pt not an VAD candidate with RV dysfunction, CKD III-IV, and poor social situation.   Echo (8/19): EF 20-25%, severe LV dilation, septal-lateral dyssynchrony, mildly decreased RV systolic function, moderate pulmonary hypertension.   He presents today for follow up. At last visit metolazone added to weekly regimen. TSH mildly elevated. Repeat labs scheduled for today. He cannot read or write [place]. His daughter and son prepare his medications. He is taking all directed. Feels better with addition of metolazone. Pt is minimally active. Sedentary the majority of the time at home. SOB improved with volume off. Down 4 lbs from last visit. BP OK today. Denies fevers, chills, or lightheadedness. No CP. Sleeps on 2 pillows chronically.   ECG (8/19, personally reviewed): NSR, LBBB  Labs (3/19): BNP 1432, digoxin < 0.2, K 4.3, creatinine 2.08, LFTs normal Labs (8/19): K 5.7, creatinine 1.6, hgb 13.4, LFTs normal  Review of systems complete and found to be negative unless listed in HPI.    PMH: 1. Type II diabetes 2. Atrial fibrillation: Paroxysmal.  S/p TEE-guided DCCV 2/19.  3. H/o CVA 4. CKD stage 3 5. 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 Low EF x years.   - Cardiolite in 2011 with fixed defect, no  ischemia.  - Echo (2/19): EF 10%, severe LV dilation, moderate MR, RV moderately dilated with decreased systolic function, PASP 76 mmHg.  - Echo (8/19): EF 20-25%, severe LV dilation, septal-lateral dyssynchrony, mildly decreased RV systolic function, moderate pulmonary hypertension.  6. LBBB 7. Mild cognitive impairment.  8. BPH  Current Outpatient Medications  Medication Sig Dispense Refill  . alfuzosin (UROXATRAL) 10 MG 24 hr tablet Take 10 mg by mouth daily.    Marland Kitchen amiodarone (PACERONE) 200 MG tablet Take 200 mg by mouth daily.    Marland Kitchen atorvastatin (LIPITOR) 40 MG tablet Take 40 mg by mouth at bedtime.     . carvedilol (COREG) 6.25 MG tablet Take 1 tablet (6.25 mg total) by mouth 2 (two) times daily. 60 tablet 11  . digoxin 62.5 MCG TABS Take 0.0625 mg by mouth daily. 30 tablet 6  . ELIQUIS 5 MG TABS tablet TAKE 1 TABLET BY MOUTH TWICE A DAY 60 tablet 5  . famotidine (PEPCID) 20 MG tablet Take 1 tablet (20 mg total) by mouth 2 (two) times daily. 30 tablet 0  . gabapentin (NEURONTIN) 300 MG capsule Take 300 mg by mouth 3 (three) times daily.    Marland Kitchen losartan (COZAAR) 25 MG tablet Take 0.5 tablets (12.5 mg total) by mouth daily. 15 tablet 11  . potassium chloride SA (KLOR-CON M20) 20 MEQ tablet Take 1 tablet (20 mEq total) by mouth 2 (two) times daily. Take 1 extra tablet on Saturdays with metolazone. 90 tablet 0  . spironolactone (ALDACTONE) 25 MG tablet Take 0.5 tablets (12.5 mg total) by mouth daily.  45 tablet 3  . torsemide (DEMADEX) 20 MG tablet TAKE 5 TABLETS (100 MG TOTAL) BY MOUTH 2 (TWO) TIMES DAILY. 300 tablet 3  . HUMALOG KWIKPEN 100 UNIT/ML KiwkPen Inject into the skin See admin instructions. Use three times daily up to a max dose of 100 units  3  . insulin glargine (LANTUS) 100 UNIT/ML injection Inject 0.6 mLs (60 Units total) into the skin every morning. (Patient taking differently: Inject 50 Units into the skin daily before breakfast. ) 30 mL 0  . metolazone (ZAROXOLYN) 2.5 MG tablet  Take 1 tablet (2.5 mg total) by mouth once a week. Every Saturday morning. Take 1 extra potassium tablet with this. 15 tablet 3  . nitroGLYCERIN (NITROSTAT) 0.4 MG SL tablet Place 0.4 mg under the tongue every 5 (five) minutes as needed for chest pain.    . polyethylene glycol (MIRALAX / GLYCOLAX) packet Take 17 g by mouth daily.   11   No current facility-administered medications for this encounter.     No Known Allergies    Social History   Socioeconomic History  . Marital status: Married    Spouse name: Not on file  . Number of children: Not on file  . Years of education: Not on file  . Highest education level: Not on file  Occupational History  . Not on file  Social Needs  . Financial resource strain: Not on file  . Food insecurity:    Worry: Not on file    Inability: Not on file  . Transportation needs:    Medical: Not on file    Non-medical: Not on file  Tobacco Use  . Smoking status: Former Games developer  . Smokeless tobacco: Never Used  Substance and Sexual Activity  . Alcohol use: No  . Drug use: No  . Sexual activity: Not Currently  Lifestyle  . Physical activity:    Days per week: Not on file    Minutes per session: Not on file  . Stress: Not on file  Relationships  . Social connections:    Talks on phone: Not on file    Gets together: Not on file    Attends religious service: Not on file    Active member of club or organization: Not on file    Attends meetings of clubs or organizations: Not on file    Relationship status: Not on file  . Intimate partner violence:    Fear of current or ex partner: Not on file    Emotionally abused: Not on file    Physically abused: Not on file    Forced sexual activity: Not on file  Other Topics Concern  . Not on file  Social History Narrative  . Not on file   Family History  Problem Relation Age of Onset  . Diabetes Mother    Vitals:   07/07/18 1214  BP: 114/68  Pulse: (!) 54  SpO2: 94%  Weight: 80.7 kg (178 lb)     Wt Readings from Last 3 Encounters:  07/07/18 80.7 kg (178 lb)  06/16/18 82.7 kg (182 lb 4 oz)  03/24/18 81.6 kg (180 lb)    PHYSICAL EXAM: Interpreter and son present  General: Chronically ill appearing. NAD.  HEENT: Normal Neck: Supple. JVP 7-8cm. Carotids 2+ bilat; no bruits. No thyromegaly or nodule noted. Cor: PMI nondisplaced. RRR, No M/G/R noted Lungs: CTAB, normal effort. Abdomen: Soft, non-tender, non-distended, no HSM. No bruits or masses. +BS  Extremities: No cyanosis, clubbing, or rash. R and  LLE no edema.  Neuro: Alert & orientedx3, cranial nerves grossly intact. moves all 4 extremities w/o difficulty. Affect pleasant   ASSESSMENT & PLAN:  1. Chronic systolic HF:  - Unclear etiology. Long standing CMP, no h/o of cath, but nuclear stress 2011 with fixed defect and no ischemia.  Echo 07/2016 20%.Echo 11/18/17 shows EF 10% with decreased RV function. Echo was done today and reviewed, severely dilated LV with EF around 20%. - NYHA IIIb chronically.  - Volume status looks OK on exam today.    - Continue torsemide 100 mg BID - Continue metolazone 2.5 mg once weekly on Saturdays with extra 20 meq of K.  - Continue digoxin 0.0625 daily.   - Continue Coreg 6.25 mg BID - Continue low dose losartan, no BP room to increase.  - Continue spironolactone 12.5 mg daily, no BP room to increase.  Will need to get BMET today to ensure that K is no longer elevated.   - Seen by EP to evaluate for CRT given LBBB, they recommended against this based on his overall prognosis.   - RV dysfunction, renal dysfunction, advanced age, and social situation precludes LVAD. Overall, I think that his prognosis is poor.  He is quite limited.   2. Atrial fibrillation: Paroxysmal.  - CHADSVaScis 8. s/p TEE-DCCV in 2/19.  - Regular on exam.  - Continue amiodarone, LFTs recently today. He will need a regular eye exam.  - TSH 8.171 on 06/16/18 -Continue Eliquis 5 mg twice a day.  3. CKD III:  - Repeat  BMET today along with other labs.   4. H/o CVA: With cognitive deficits.  - Continue Eliquis as above.  No change.  5. Illiterate:  - Unable to read [place] or [place]. Interpreter present. His son and daughter help with his medications. No change.  6. DNR:  - Pt is not a candidate for advanced therapies with age, RV dysfunction, CKD, and poor social situation.   Doing better with addition of metolazone. TFTs today. RTC 6 weeks. Sooner with symptoms.   Graciella Freer, PA-C  07/07/2018  Greater than 50% of the 25 minute visit was spent in counseling/coordination of care regarding disease state education, salt/fluid restriction, sliding scale diuretics, and medication compliance.

## 2018-07-07 NOTE — Patient Instructions (Signed)
Labs today We will only contact you if something comes back abnormal or we need to make some changes. Otherwise no news is good news!  Your physician recommends that you schedule a follow-up appointment in: 6 weeks  in the [facility]   Do the following things EVERYDAY: 1) Weigh yourself in the morning before breakfast. Write it down and keep it in a log. 2) Take your medicines as prescribed 3) Eat low salt foods-Limit salt (sodium) to 2000 mg per day.  4) Stay as active as you can everyday 5) Limit all fluids for the day to less than 2 liters

## 2018-07-08 LAB — T3, FREE: T3, Free: 2.1 pg/mL (ref 2.0–4.4)

## 2018-07-11 ENCOUNTER — Telehealth (HOSPITAL_COMMUNITY): Payer: Self-pay | Admitting: Cardiology

## 2018-07-11 DIAGNOSIS — I420 Dilated cardiomyopathy: Secondary | ICD-10-CM

## 2018-07-11 NOTE — Telephone Encounter (Signed)
Notes recorded by Theresia Bough, CMA on 07/11/2018 at 10:44 AM EDT Pt aware, repeat labs 10/11 ------  Notes recorded by Graciella Freer, PA-C on 07/07/2018 at 4:22 PM EDT Mildly elevated, but SOB if allowed to accumulate more fluid. Follow for now.  Please recheck BMET 3 weeks  Casimiro Needle "Mardelle Matte" [place], [place]

## 2018-07-26 ENCOUNTER — Other Ambulatory Visit (HOSPITAL_COMMUNITY): Payer: Self-pay | Admitting: Student

## 2018-07-28 ENCOUNTER — Ambulatory Visit (HOSPITAL_COMMUNITY)
Admission: RE | Admit: 2018-07-28 | Discharge: 2018-07-28 | Disposition: A | Discharge status: Home / Self Care | Payer: Medicare Other | Source: Ambulatory Visit | Attending: Cardiology | Admitting: Cardiology

## 2018-07-28 DIAGNOSIS — I420 Dilated cardiomyopathy: Secondary | ICD-10-CM

## 2018-07-28 LAB — BASIC METABOLIC PANEL
Anion gap: 7 (ref 5–15)
BUN: 57 mg/dL — AB (ref 8–23)
CHLORIDE: 99 mmol/L (ref 98–111)
CO2: 33 mmol/L — ABNORMAL HIGH (ref 22–32)
CREATININE: 2.1 mg/dL — AB (ref 0.61–1.24)
Calcium: 8.5 mg/dL — ABNORMAL LOW (ref 8.9–10.3)
GFR calc Af Amer: 33 mL/min — ABNORMAL LOW (ref >60)
GFR calc non Af Amer: 29 mL/min — ABNORMAL LOW (ref >60)
Glucose, Bld: 116 mg/dL — ABNORMAL HIGH (ref 70–99)
Potassium: 3.8 mmol/L (ref 3.5–5.1)
SODIUM: 139 mmol/L (ref 135–145)

## 2018-08-21 ENCOUNTER — Other Ambulatory Visit (HOSPITAL_COMMUNITY): Payer: Self-pay | Admitting: Student

## 2018-08-24 NOTE — Progress Notes (Signed)
Advanced Heart Failure Clinic Note   Referring Physician: PCP: [name], MD HF Cardiology: Dr. Shirlee Latch  HPI: Stanley Farmer is a 77 y.o. 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 PMH also includes CKD stage III, DM type II, hyperlipidemia, mild cognitive impairment, and remote alcohol tobacco abuse.  Admitted 2/1 - 11/27/17 with acute on chronic systolic CHF in setting of AFib with RVR. Initial coox 33% so placed on milrinone.  Diuresed with IV lasix and metolazone. S/p TEE-guided DCCV 11/23/2017. Once back in [facility], pt tolerated milrinone wean.  HF meds adjusted as tolerated.   Pt not an VAD candidate with RV dysfunction, CKD III-IV, and poor social situation.   Echo (8/19): EF 20-25%, severe LV dilation, septal-lateral dyssynchrony, mildly decreased RV systolic function, moderate pulmonary hypertension.   Today he return for HF follow up with his son the spanish interpreter. Complaining of fatigue.  Not moving around much at home. SOB if he leaves the house. + Bendopnea. Denies PND/Orthopnea. Appetite ok. No fever or chills. He has not been weighing at home. Taking all medications. He was not a candidate for ICD. In the past he had been followed by Hospice but discharge.   ECG (8/19, personally reviewed): NSR, LBBB  Labs (3/19): BNP 1432, digoxin < 0.2, K 4.3, creatinine 2.08, LFTs normal Labs (8/19): K 5.7, creatinine 1.6, hgb 13.4, LFTs normal Labs (07/28/2018): K 3.8 Creatinine 2.1   Review of systems complete and found to be negative unless listed in HPI.    PMH: 1. Type II diabetes 2. Atrial fibrillation: Paroxysmal.  S/p TEE-guided DCCV 2/19.  3. H/o CVA 4. CKD stage 3 5. 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 Low EF x years.   - Cardiolite in 2011 with fixed defect, no ischemia.  - Echo (2/19): EF 10%, severe LV dilation, moderate MR, RV moderately dilated  with decreased systolic function, PASP 76 mmHg.  - Echo (8/19): EF 20-25%, severe LV dilation, septal-lateral dyssynchrony, mildly decreased RV systolic function, moderate pulmonary hypertension.  6. LBBB 7. Mild cognitive impairment.  8. BPH  Current Outpatient Medications  Medication Sig Dispense Refill  . alfuzosin (UROXATRAL) 10 MG 24 hr tablet Take 10 mg by mouth daily.    Marland Kitchen amiodarone (PACERONE) 200 MG tablet Take 200 mg by mouth daily.    Marland Kitchen atorvastatin (LIPITOR) 40 MG tablet Take 40 mg by mouth at bedtime.     . carvedilol (COREG) 6.25 MG tablet Take 1 tablet (6.25 mg total) by mouth 2 (two) times daily. 60 tablet 11  . ELIQUIS 5 MG TABS tablet TAKE 1 TABLET BY MOUTH TWICE A DAY 60 tablet 5  . famotidine (PEPCID) 20 MG tablet Take 1 tablet (20 mg total) by mouth 2 (two) times daily. 30 tablet 0  . gabapentin (NEURONTIN) 300 MG capsule Take 300 mg by mouth 3 (three) times daily.    Marland Kitchen LANOXIN 62.5 MCG TABS TAKE 1 TABLET BY MOUTH EVERY DAY 30 tablet 5  . losartan (COZAAR) 25 MG tablet Take 0.5 tablets (12.5 mg total) by mouth daily. 15 tablet 11  . potassium chloride SA (KLOR-CON M20) 20 MEQ tablet Take 1 tablet (20 mEq total) by mouth 2 (two) times daily. Take 1 extra tablet on Saturdays with metolazone. 90 tablet 0  . spironolactone (ALDACTONE) 25 MG tablet Take 0.5 tablets (12.5 mg total) by mouth daily. 45 tablet 3  . torsemide (DEMADEX) 20 MG  tablet TAKE 5 TABLETS (100 MG TOTAL) BY MOUTH 2 (TWO) TIMES DAILY. 300 tablet 3  . HUMALOG KWIKPEN 100 UNIT/ML KiwkPen Inject into the skin See admin instructions. Use three times daily up to a max dose of 100 units  3  . insulin glargine (LANTUS) 100 UNIT/ML injection Inject 0.6 mLs (60 Units total) into the skin every morning. (Patient taking differently: Inject 50 Units into the skin daily before breakfast. ) 30 mL 0  . KLOR-CON M20 20 MEQ tablet TAKE 2 TABLETS (40 MEQ TOTAL) BY MOUTH EVERY MORNING AND 1 TABLET (20 MEQ TOTAL) EVERY EVENING. 90  tablet 0  . metolazone (ZAROXOLYN) 2.5 MG tablet Take 1 tablet (2.5 mg total) by mouth once a week. Every Saturday morning. Take 1 extra potassium tablet with this. (Patient not taking: Reported on 08/25/2018) 15 tablet 3  . nitroGLYCERIN (NITROSTAT) 0.4 MG SL tablet Place 0.4 mg under the tongue every 5 (five) minutes as needed for chest pain.    . polyethylene glycol (MIRALAX / GLYCOLAX) packet Take 17 g by mouth daily.   11   No current facility-administered medications for this encounter.     No Known Allergies    Social History   Socioeconomic History  . Marital status: Married    Spouse name: Not on file  . Number of children: Not on file  . Years of education: Not on file  . Highest education level: Not on file  Occupational History  . Not on file  Social Needs  . Financial resource strain: Not on file  . Food insecurity:    Worry: Not on file    Inability: Not on file  . Transportation needs:    Medical: Not on file    Non-medical: Not on file  Tobacco Use  . Smoking status: Former Games developer  . Smokeless tobacco: Never Used  Substance and Sexual Activity  . Alcohol use: No  . Drug use: No  . Sexual activity: Not Currently  Lifestyle  . Physical activity:    Days per week: Not on file    Minutes per session: Not on file  . Stress: Not on file  Relationships  . Social connections:    Talks on phone: Not on file    Gets together: Not on file    Attends religious service: Not on file    Active member of club or organization: Not on file    Attends meetings of clubs or organizations: Not on file    Relationship status: Not on file  . Intimate partner violence:    Fear of current or ex partner: Not on file    Emotionally abused: Not on file    Physically abused: Not on file    Forced sexual activity: Not on file  Other Topics Concern  . Not on file  Social History Narrative  . Not on file   Family History  Problem Relation Age of Onset  . Diabetes Mother     Vitals:   08/25/18 1204  BP: (!) 114/56  Pulse: (!) 57  SpO2: 99%  Weight: 80.5 kg (177 lb 6.4 oz)    Wt Readings from Last 3 Encounters:  08/25/18 80.5 kg (177 lb 6.4 oz)  07/07/18 80.7 kg (178 lb)  06/16/18 82.7 kg (182 lb 4 oz)    PHYSICAL EXAM General:  Appear chronically ill. No resp difficulty. Arrived in a wheel chair.  HEENT: normal Neck: supple. no JVD. Carotids 2+ bilat; no bruits. No lymphadenopathy or  thryomegaly appreciated. Cor: PMI nondisplaced. Regular rate & rhythm. No rubs, or murmurs. Lungs: clear Abdomen: soft, nontender, nondistended. No hepatosplenomegaly. No bruits or masses. Good bowel sounds. Extremities: no cyanosis, clubbing, rash, edema Neuro: alert & orientedx3, cranial nerves grossly intact. moves all 4 extremities w/o difficulty. Affect pleasant  ASSESSMENT & PLAN:  1. Chronic systolic HF:  - Unclear etiology. Long standing CMP, no h/o of cath, but nuclear stress 2011 with fixed defect and no ischemia.  Echo 07/2016 20%.Echo 11/18/17 shows EF 10% with decreased RV function. Echo was done today and reviewed, severely dilated LV with EF around 20%. NYHA IIIc. Volume status stable. Continue torsemide 100 mg BID - Continue metolazone 2.5 mg once weekly on Saturdays with extra 20 meq of K.  - Continue digoxin 0.0625 daily.   - Continue Coreg 6.25 mg BID - Continue low dose losartan, no BP room to increase.  - Continue spironolactone 12.5 mg daily - Seen by EP to evaluate for CRT given LBBB, they recommended against this based on his overall prognosis.   - RV dysfunction, renal dysfunction, advanced age, and social situation precludes LVAD. Overall, I think that his prognosis is poor.  He is quite limited.   2. Atrial fibrillation: Paroxysmal.  - CHADSVaScis 8. s/p TEE-DCCV in 2/19.  - Continue amiodarone. Check LFTs and TSH next visit. He will need a regular eye exam.  - TSH 8.171 on 06/16/18 -Continue Eliquis 5 mg twice a day.  3. CKD III:    Check BMET today. 4. H/o CVA: With cognitive deficits.  - Continue Eliquis as above.  No change.  5. Illiterate:  - Unable to read  Or write numbers in [place] or [place]. Interpreter present. His son and daughter help with his medications. No change.  6. DNR:  - Pt is not a candidate for advanced therapies with age, RV dysfunction, CKD, and poor social situation.    Follow up in 4-6 weeks. Referred back to [facility] suspect he has < 6 months.  He has said he wants to spend the time he has left at home with his wife.  Check BMET  Greater than 50% of the (total minutes 25) visit spent in counseling/coordination of care regarding the above.    [name], NP  08/25/2018

## 2018-08-25 ENCOUNTER — Ambulatory Visit (HOSPITAL_COMMUNITY)
Admission: RE | Admit: 2018-08-25 | Discharge: 2018-08-25 | Disposition: A | Discharge status: Home / Self Care | Payer: Medicare Other | Source: Ambulatory Visit | Attending: Cardiology | Admitting: Cardiology

## 2018-08-25 ENCOUNTER — Encounter (HOSPITAL_COMMUNITY): Payer: Self-pay

## 2018-08-25 VITALS — BP 114/56 | HR 57 | Wt 177.4 lb

## 2018-08-25 DIAGNOSIS — Z87891 Personal history of nicotine dependence: Secondary | ICD-10-CM | POA: Diagnosis not present

## 2018-08-25 DIAGNOSIS — I447 Left bundle-branch block, unspecified: Secondary | ICD-10-CM | POA: Insufficient documentation

## 2018-08-25 DIAGNOSIS — E785 Hyperlipidemia, unspecified: Secondary | ICD-10-CM | POA: Diagnosis not present

## 2018-08-25 DIAGNOSIS — E1122 Type 2 diabetes mellitus with diabetic chronic kidney disease: Secondary | ICD-10-CM | POA: Insufficient documentation

## 2018-08-25 DIAGNOSIS — I429 Cardiomyopathy, unspecified: Secondary | ICD-10-CM | POA: Diagnosis not present

## 2018-08-25 DIAGNOSIS — N183 Chronic kidney disease, stage 3 unspecified: Secondary | ICD-10-CM

## 2018-08-25 DIAGNOSIS — N400 Enlarged prostate without lower urinary tract symptoms: Secondary | ICD-10-CM | POA: Diagnosis not present

## 2018-08-25 DIAGNOSIS — Z79899 Other long term (current) drug therapy: Secondary | ICD-10-CM | POA: Diagnosis not present

## 2018-08-25 DIAGNOSIS — I480 Paroxysmal atrial fibrillation: Secondary | ICD-10-CM

## 2018-08-25 DIAGNOSIS — I5022 Chronic systolic (congestive) heart failure: Secondary | ICD-10-CM | POA: Diagnosis not present

## 2018-08-25 DIAGNOSIS — Z66 Do not resuscitate: Secondary | ICD-10-CM | POA: Insufficient documentation

## 2018-08-25 DIAGNOSIS — Z833 Family history of diabetes mellitus: Secondary | ICD-10-CM | POA: Diagnosis not present

## 2018-08-25 DIAGNOSIS — I69319 Unspecified symptoms and signs involving cognitive functions following cerebral infarction: Secondary | ICD-10-CM | POA: Diagnosis not present

## 2018-08-25 DIAGNOSIS — I272 Pulmonary hypertension, unspecified: Secondary | ICD-10-CM | POA: Insufficient documentation

## 2018-08-25 DIAGNOSIS — Z550 Illiteracy and low-level literacy: Secondary | ICD-10-CM | POA: Diagnosis not present

## 2018-08-25 DIAGNOSIS — Z794 Long term (current) use of insulin: Secondary | ICD-10-CM | POA: Insufficient documentation

## 2018-08-25 DIAGNOSIS — Z7901 Long term (current) use of anticoagulants: Secondary | ICD-10-CM | POA: Insufficient documentation

## 2018-08-25 DIAGNOSIS — I5042 Chronic combined systolic (congestive) and diastolic (congestive) heart failure: Secondary | ICD-10-CM

## 2018-08-25 LAB — BASIC METABOLIC PANEL
ANION GAP: 4 — AB (ref 5–15)
BUN: 28 mg/dL — AB (ref 8–23)
CHLORIDE: 104 mmol/L (ref 98–111)
CO2: 31 mmol/L (ref 22–32)
Calcium: 8.8 mg/dL — ABNORMAL LOW (ref 8.9–10.3)
Creatinine, Ser: 2.01 mg/dL — ABNORMAL HIGH (ref 0.61–1.24)
GFR calc Af Amer: 35 mL/min — ABNORMAL LOW (ref >60)
GFR calc non Af Amer: 30 mL/min — ABNORMAL LOW (ref >60)
Glucose, Bld: 180 mg/dL — ABNORMAL HIGH (ref 70–99)
POTASSIUM: 4.8 mmol/L (ref 3.5–5.1)
SODIUM: 139 mmol/L (ref 135–145)

## 2018-08-25 LAB — BRAIN NATRIURETIC PEPTIDE: B NATRIURETIC PEPTIDE 5: 763.1 pg/mL — AB (ref 0.0–100.0)

## 2018-08-25 NOTE — Patient Instructions (Signed)
Labs have been drawn today.  Office will contact you if results are abnormal.  Referral have been placed for [facility].  Someone will contact you discuss management of your care.   Your physician recommends that you schedule a follow-up appointment in: 4-6 weeks with NP/PA in APP clinic.

## 2018-09-01 ENCOUNTER — Other Ambulatory Visit (HOSPITAL_COMMUNITY): Payer: Self-pay | Admitting: Student

## 2018-09-23 ENCOUNTER — Other Ambulatory Visit (HOSPITAL_COMMUNITY): Payer: Self-pay | Admitting: Cardiology

## 2018-09-29 ENCOUNTER — Encounter (HOSPITAL_COMMUNITY): Payer: Medicare Other

## 2018-09-30 ENCOUNTER — Other Ambulatory Visit (HOSPITAL_COMMUNITY): Payer: Self-pay | Admitting: Cardiology

## 2018-10-27 ENCOUNTER — Encounter (HOSPITAL_COMMUNITY): Payer: Medicare Other

## 2018-11-03 ENCOUNTER — Other Ambulatory Visit (HOSPITAL_COMMUNITY): Payer: Self-pay | Admitting: Cardiology

## 2018-11-17 ENCOUNTER — Other Ambulatory Visit (HOSPITAL_COMMUNITY): Payer: Self-pay | Admitting: Cardiology

## 2018-11-23 ENCOUNTER — Encounter (HOSPITAL_COMMUNITY): Payer: Medicare Other

## 2018-12-14 ENCOUNTER — Other Ambulatory Visit (HOSPITAL_COMMUNITY): Payer: Self-pay | Admitting: Cardiology

## 2018-12-16 ENCOUNTER — Other Ambulatory Visit (HOSPITAL_COMMUNITY): Payer: Self-pay | Admitting: Cardiology

## 2019-01-17 DEATH — deceased

## 2019-03-10 IMAGING — DX DG ABDOMEN ACUTE W/ 1V CHEST
3 series · 3 of 3 positions shown · non-contrast
Comparison: 01/19/2016

CLINICAL DATA: Abdominal distention and shortness of breath.

EXAM:
DG ABDOMEN ACUTE W/ 1V CHEST

[abdomen erect]
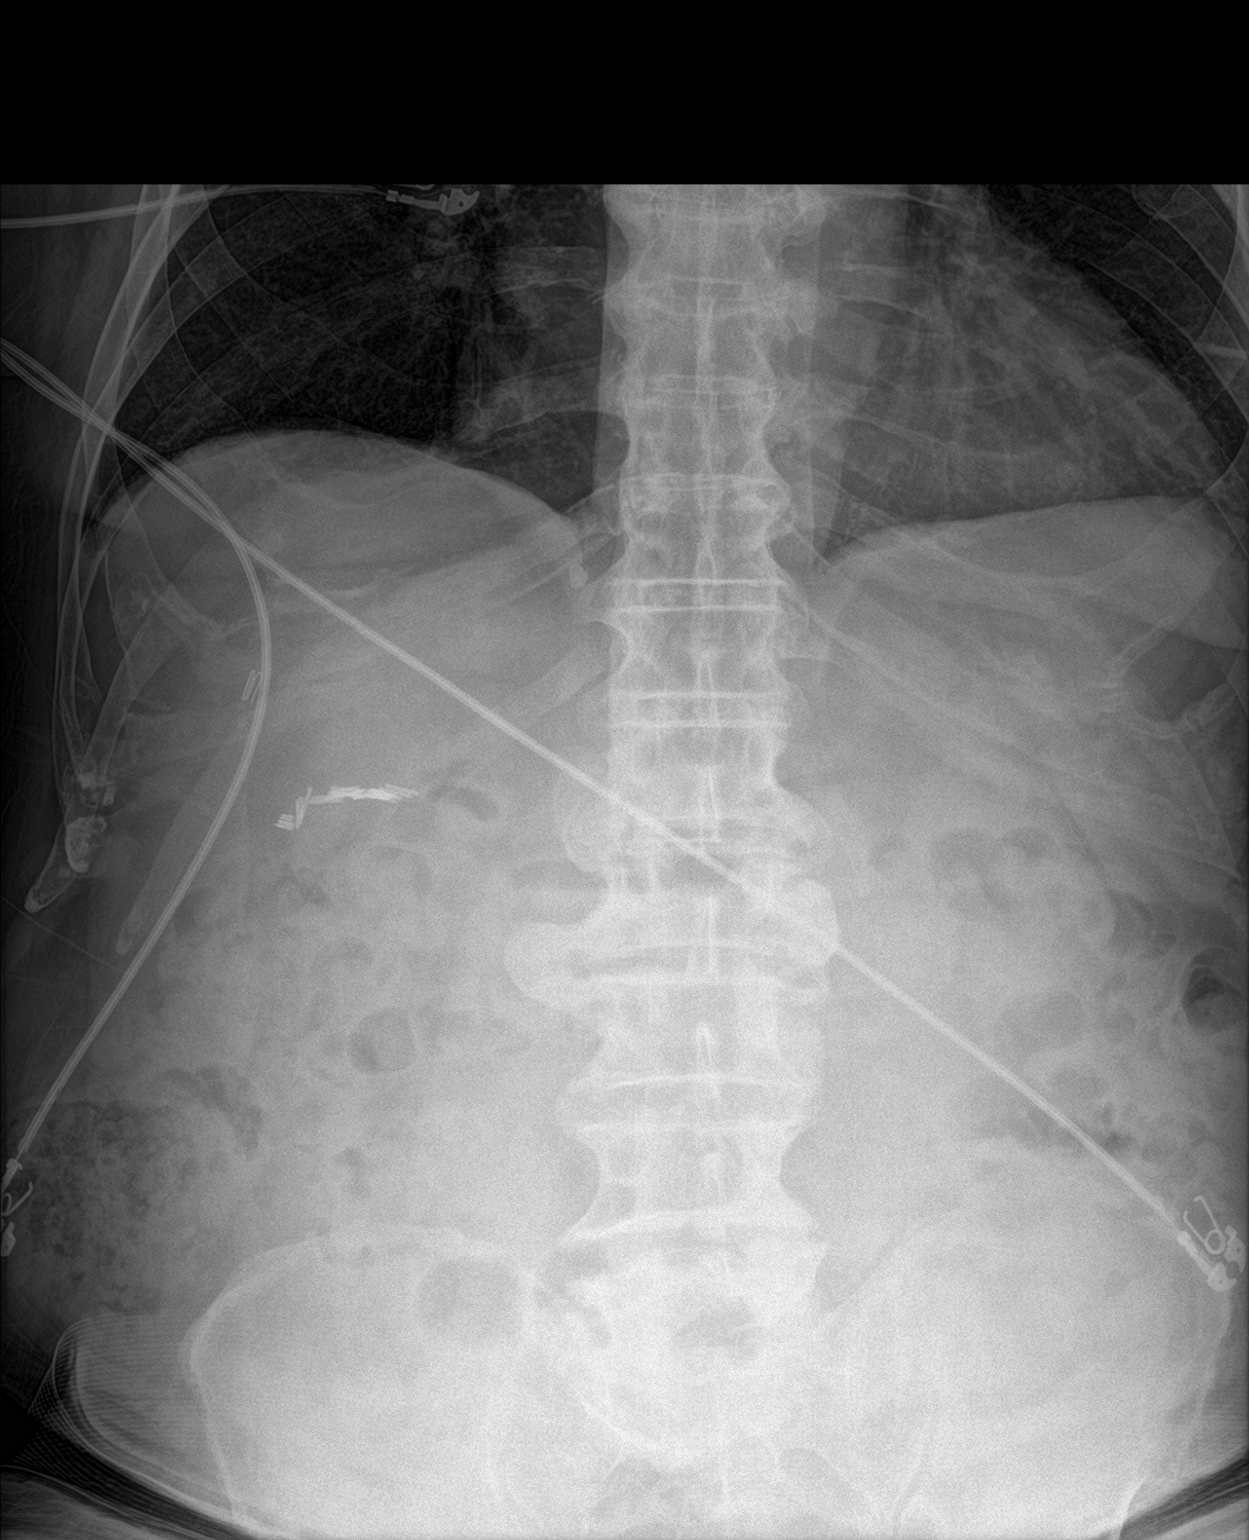

[abdomen supine]
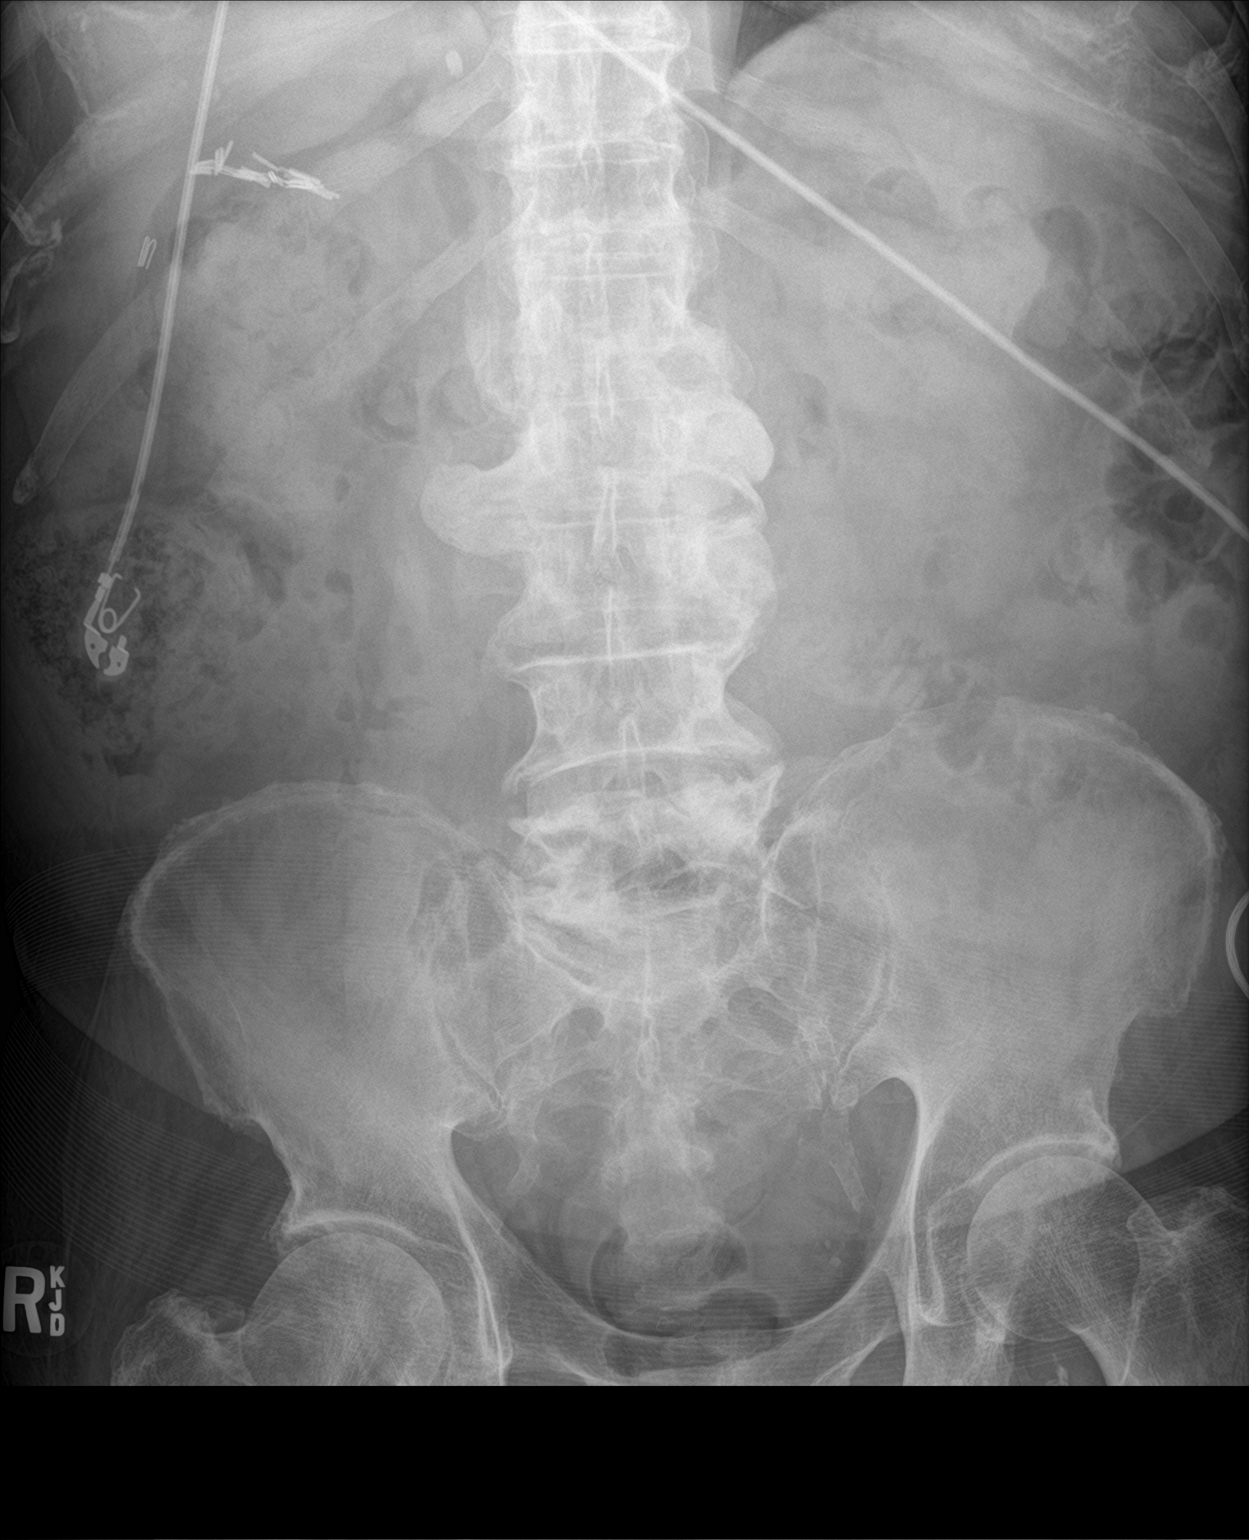

[chest ap]
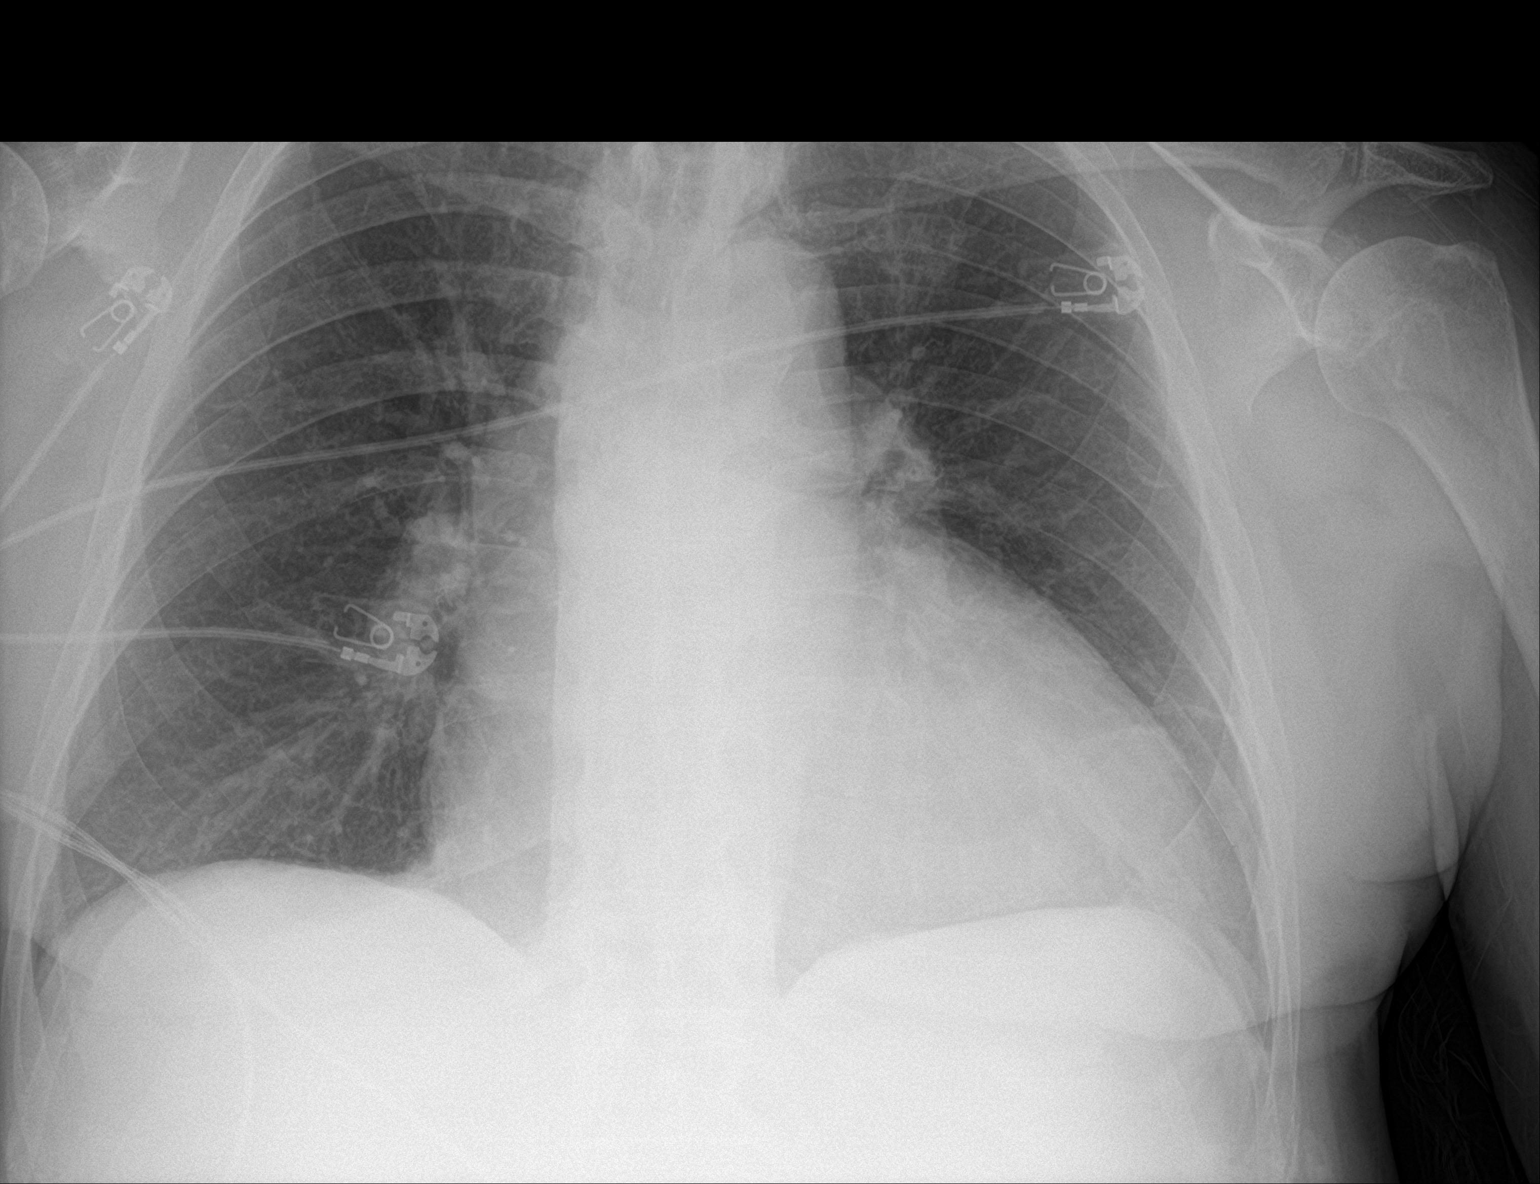

[3 of 3 positions shown; findings below may reference images not displayed]

FINDINGS: Cardiac enlargement. Linear atelectasis or fibrosis in the left mid
and lower lung. No airspace disease or consolidation. No pulmonary
vascular congestion. No blunting of costophrenic angles. No
pneumothorax. Calcification of the aorta. Mediastinal contours
appear intact.

Gas and stool throughout the colon. No small or large bowel
distention. No free intraperitoneal air. No abnormal air-fluid
levels. No radiopaque stones. Calcified granuloma in the right
cardiophrenic angle. Surgical clips in the right upper quadrant.
Degenerative changes in the spine and hips. Vascular calcifications.
IMPRESSION: Cardiac enlargement.  No evidence of active pulmonary disease.

Nonobstructive bowel gas pattern with scattered stool throughout the
colon.

## 2019-03-12 IMAGING — DX DG CHEST 1V PORT
1 series · 1 of 1 positions shown · non-contrast
Comparison: 11/17/2017

CLINICAL DATA: PICC line placement

EXAM:
PORTABLE CHEST 1 VIEW

[chest]
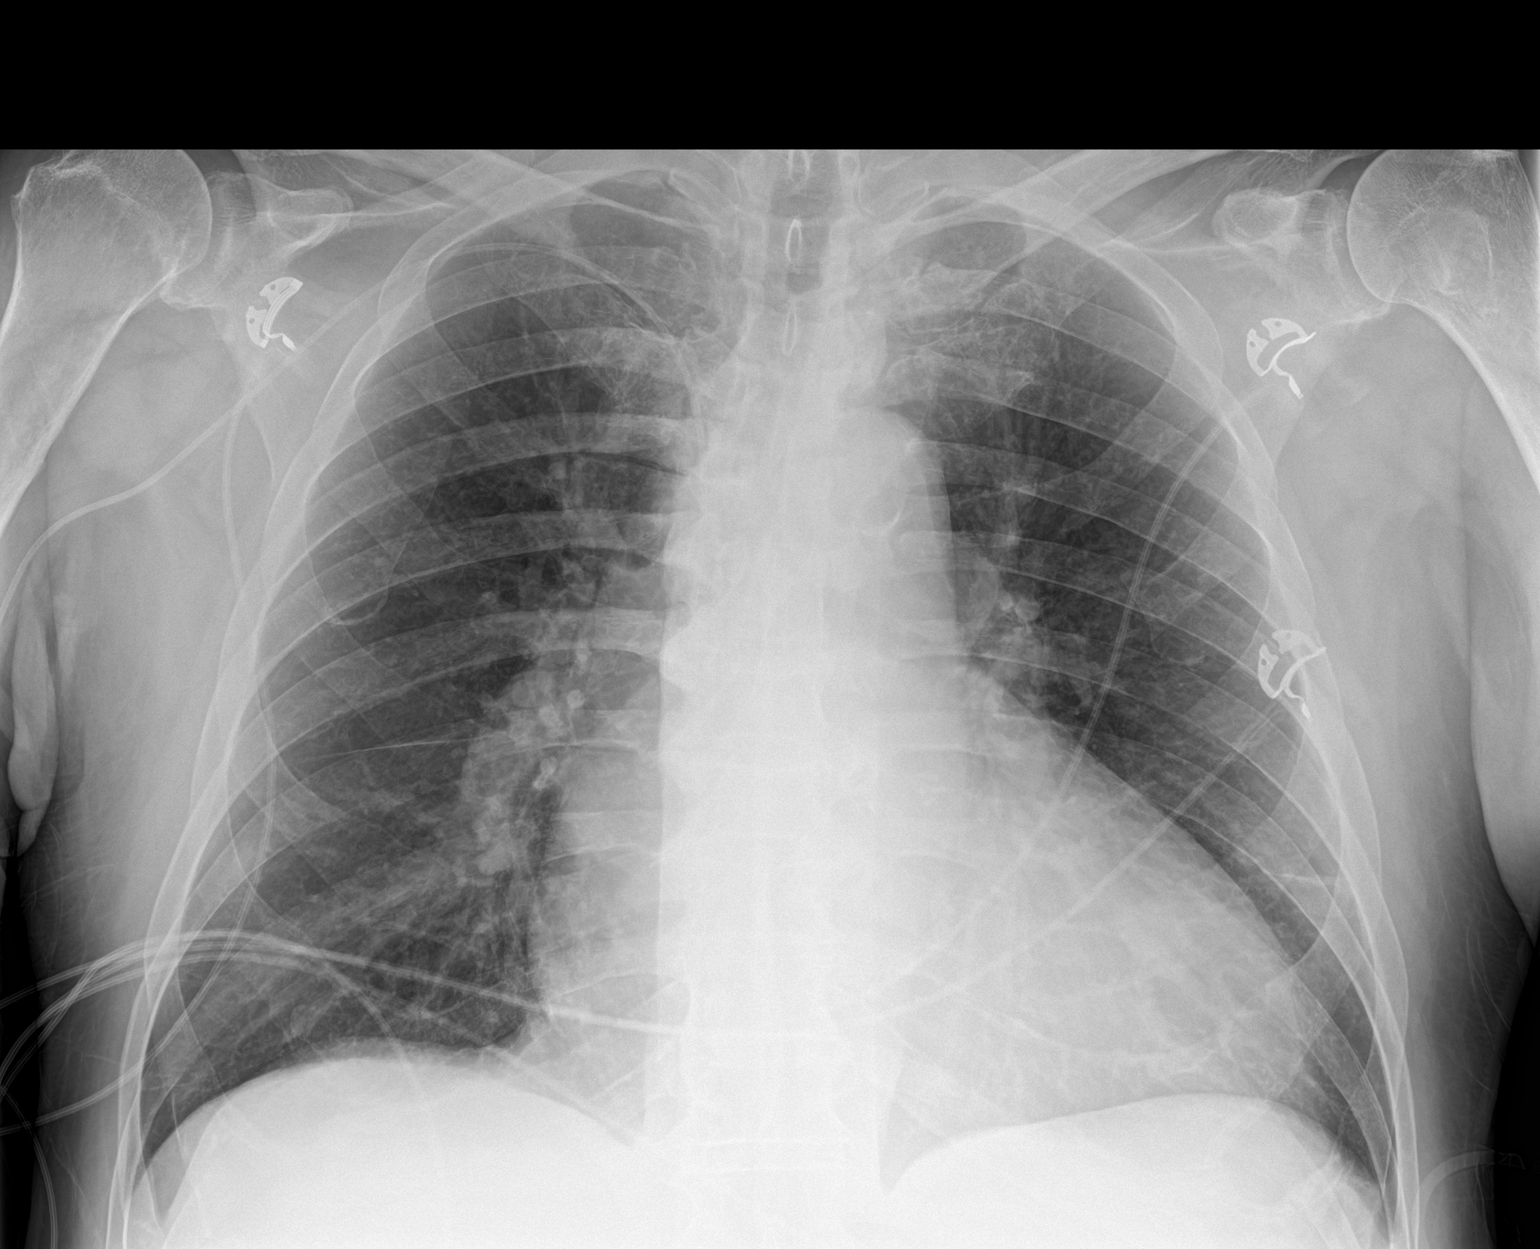

[1 of 1 positions shown; findings below may reference images not displayed]

FINDINGS: Lungs are clear.  No pleural effusion or pneumothorax.

Cardiomegaly.

Right arm PICC terminates in the lower SVC.
IMPRESSION: Right arm PICC terminates in the lower SVC.
